# Patient Record
Sex: Female | Born: 1965 | Race: White | Hispanic: Yes | Smoking: Never smoker
Health system: Southern US, Community
[De-identification: ages and names within clinical notes are randomized; demographics above are authoritative.]

## PROBLEM LIST (undated history)

## (undated) DIAGNOSIS — E7800 Pure hypercholesterolemia, unspecified: Secondary | ICD-10-CM

## (undated) DIAGNOSIS — D649 Anemia, unspecified: Secondary | ICD-10-CM

## (undated) DIAGNOSIS — I10 Essential (primary) hypertension: Secondary | ICD-10-CM

## (undated) DIAGNOSIS — E119 Type 2 diabetes mellitus without complications: Secondary | ICD-10-CM

## (undated) DIAGNOSIS — K56609 Unspecified intestinal obstruction, unspecified as to partial versus complete obstruction: Secondary | ICD-10-CM

## (undated) HISTORY — PX: RIGHT OOPHORECTOMY: SHX2359

## (undated) HISTORY — PX: HERNIA REPAIR: SHX51

## (undated) HISTORY — PX: LEFT OOPHORECTOMY: SHX1961

---

## 2009-04-27 ENCOUNTER — Inpatient Hospital Stay (HOSPITAL_COMMUNITY): Admission: AD | Admit: 2009-04-27 | Discharge: 2009-04-29 | Payer: Self-pay | Admitting: General Surgery

## 2009-04-28 ENCOUNTER — Encounter (INDEPENDENT_AMBULATORY_CARE_PROVIDER_SITE_OTHER): Payer: Self-pay | Admitting: General Surgery

## 2009-09-19 ENCOUNTER — Ambulatory Visit (HOSPITAL_COMMUNITY): Admission: RE | Admit: 2009-09-19 | Discharge: 2009-09-19 | Payer: Self-pay | Admitting: General Surgery

## 2009-09-19 IMAGING — CT CT ABD-PELV W/ CM
2 of 5 series · 16 of 46 positions shown, 18 images · IV contrast (Omnipaque 300)
Comparison: None

CLINICAL DATA: Abdominal pain, left side and left lower abdomen
tenderness, past history of hernia repair, benign tumor removal,
diabetes, hypertension

CT ABDOMEN AND PELVIS WITH CONTRAST
TECHNIQUE: Multidetector CT imaging of the abdomen and pelvis was
performed following the standard protocol during bolus
administration of intravenous contrast. Breast shield utilized.
Sagittal and coronal MPR images reconstructed from axial data set.
Contrast: Dilute oral contrast and 100 ml [LM] IV

[Series 2: abd_pel_with 5.0 b40f · axial · 0.80mm/px · z∈[-470,-40]mm · 13 of 98 slices shown, 15 images]
[im 6/98  soft-tissue]
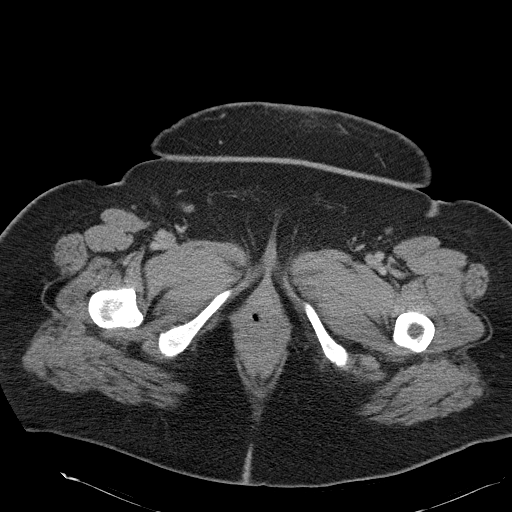
[im 6/98  bone]
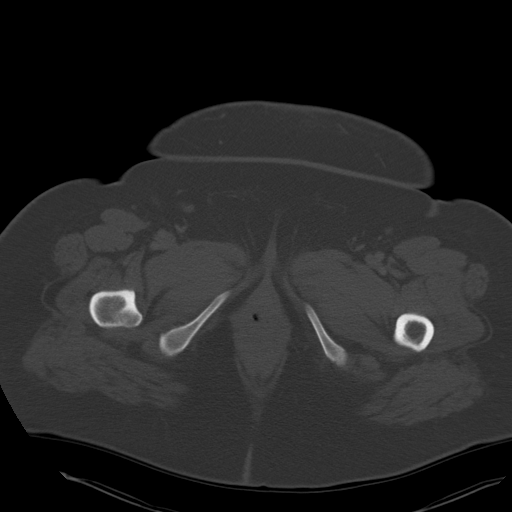
[im 12/98  soft-tissue]
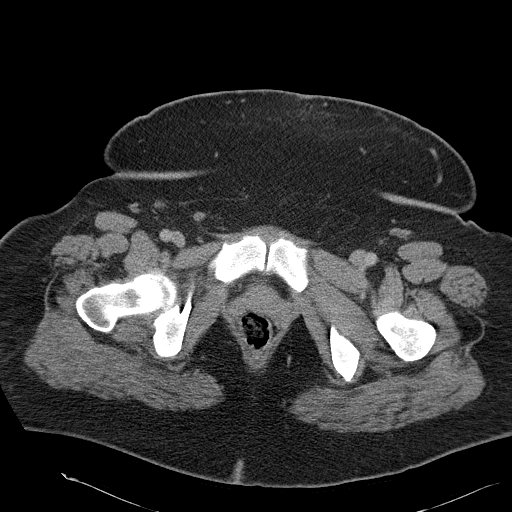
[im 23/98  soft-tissue]
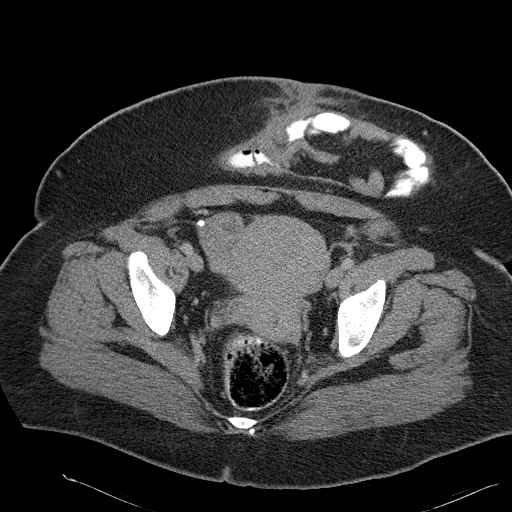
[im 29/98  soft-tissue]
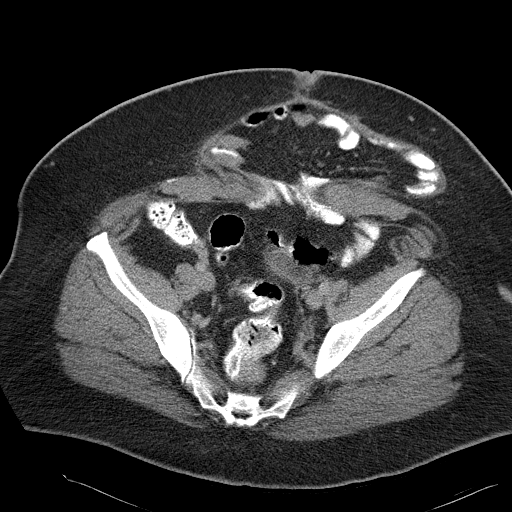
[im 35/98  soft-tissue]
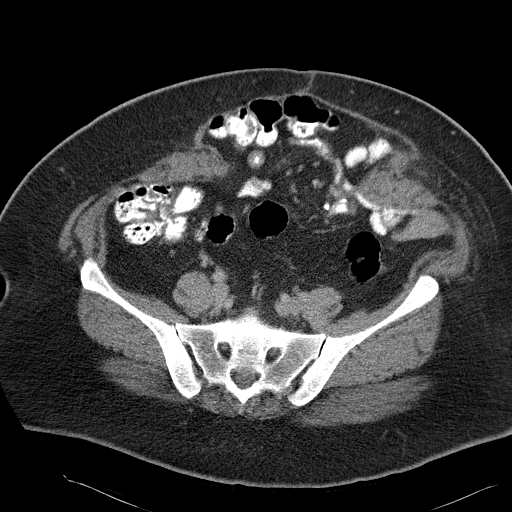
[im 40/98  soft-tissue]
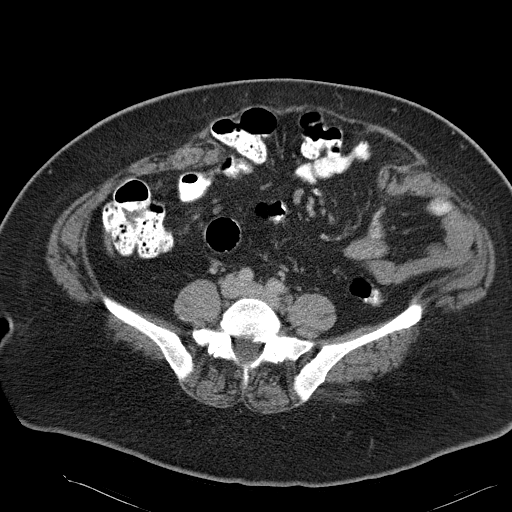
[im 52/98  soft-tissue]
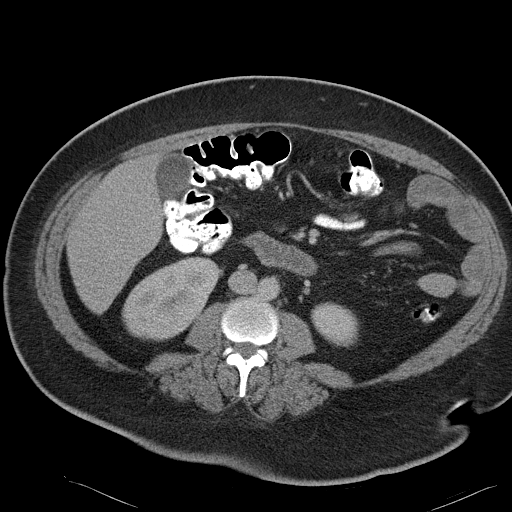
[im 58/98  soft-tissue]
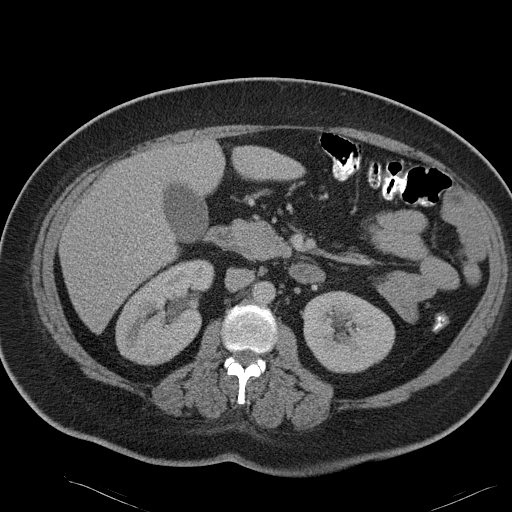
[im 63/98  soft-tissue]
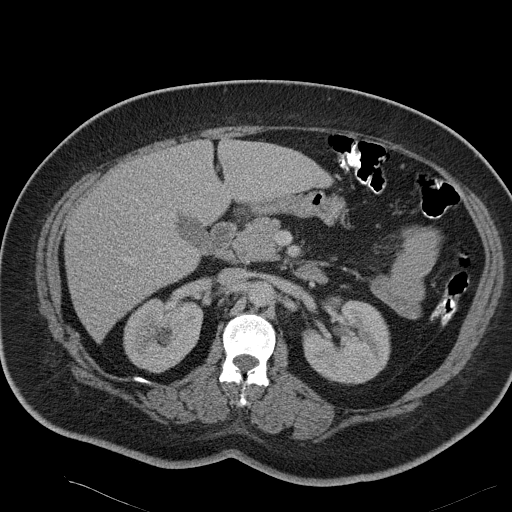
[im 63/98  bone]
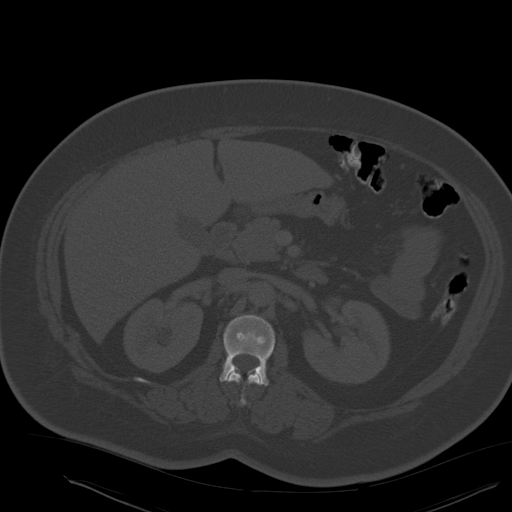
[im 69/98  soft-tissue]
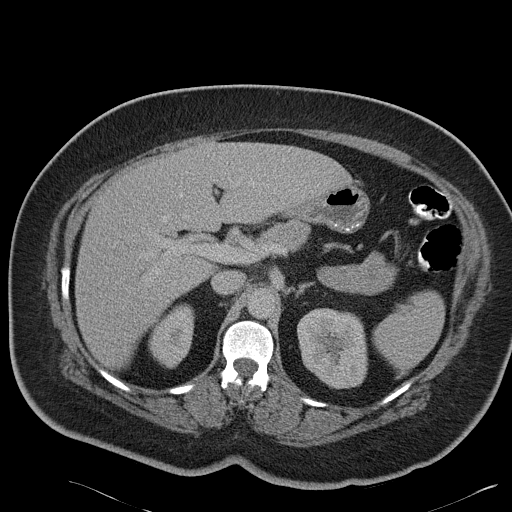
[im 75/98  soft-tissue]
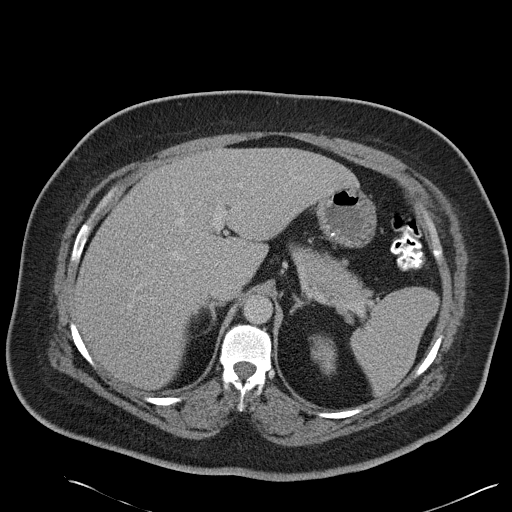
[im 86/98  soft-tissue]
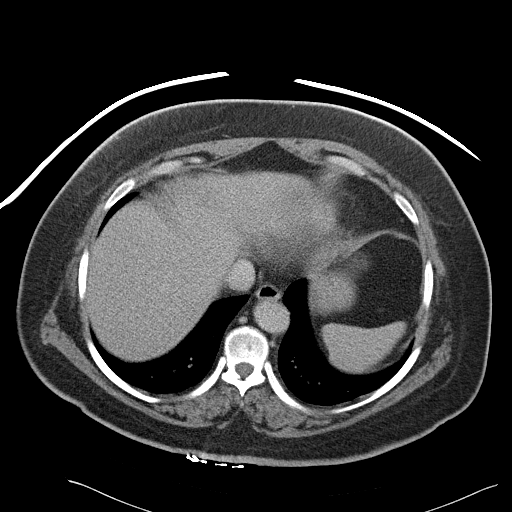
[im 92/98  soft-tissue]
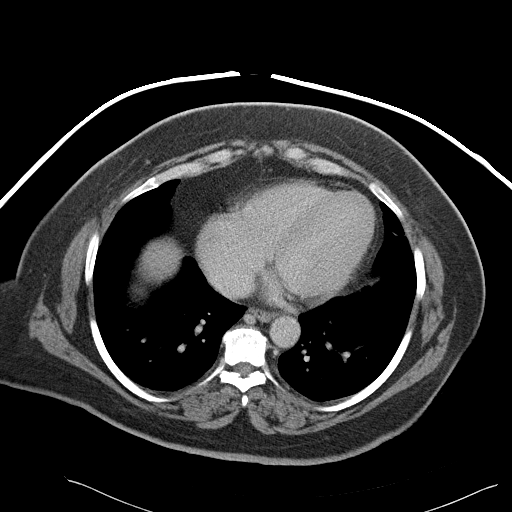

[Series 4: abd_pel_with 3.0 spo cor · coronal · 0.81mm/px · 3 of 98 slices shown]
[im 33/98  soft-tissue]
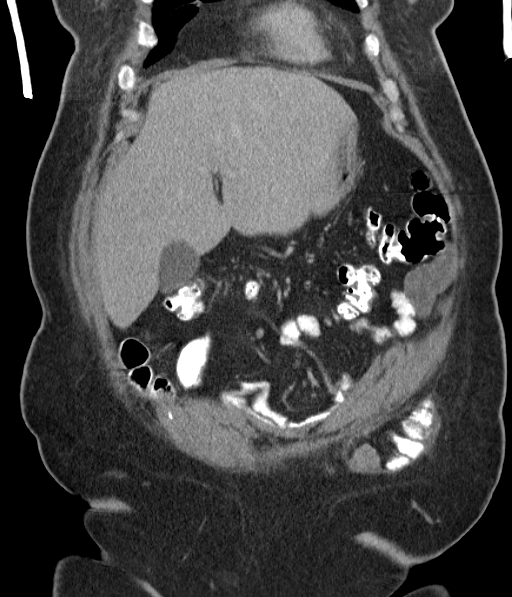
[im 44/98  soft-tissue]
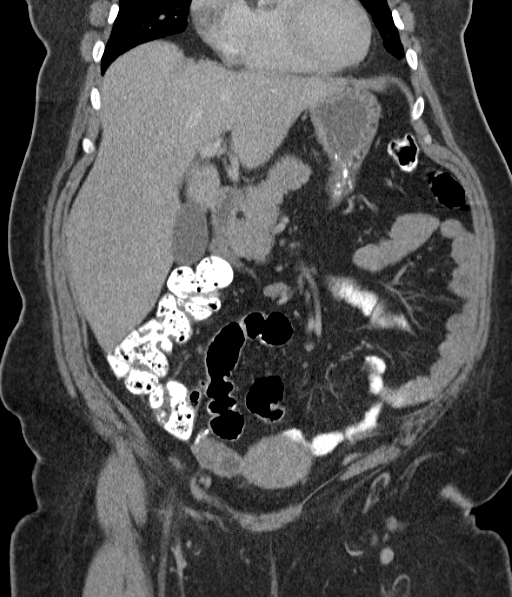
[im 54/98  soft-tissue]
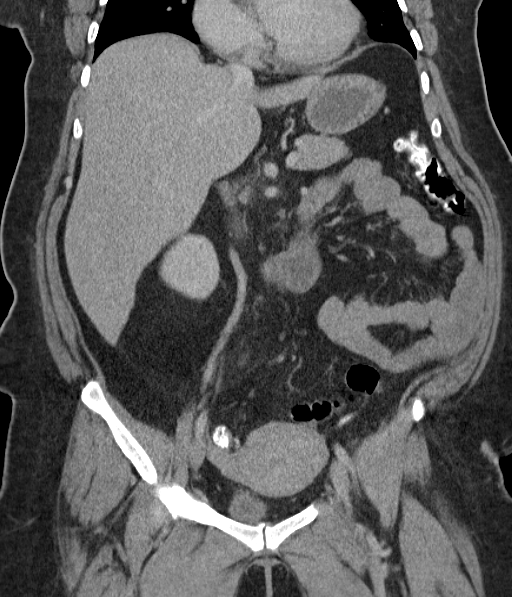

[16 of 46 positions shown; findings below may reference images not displayed]

FINDINGS: Minimal atelectasis at lung bases.
Tiny nonobstructing calculus lower pole left kidney image 42.
Liver, spleen, pancreas, kidneys, and adrenal glands otherwise
unremarkable.
Stomach normal appearance.
Tiny right ovarian cyst 2.1 x 2.0 cm image 77.
Decompressed bladder.
Uterus and left adnexa unremarkable.
Normal appendix.

Patient gives history of prior ventral hernia repair.
Numerous nonobstructed large and small bowel loops extend through a
defect in the anterior abdominal wall fascia into a large hernia
sac, extending from supraumbilical to mid pelvis, greater to left
of midline than right.
Fascial defect measures 12.5 cm in diameter and 10.1 cm length.
No bowel wall thickening identified.
Small amount of free fluid seen at inferior aspect of hernia sac.
No additional mass, ascites, or inflammatory process.
Bones unremarkable.
IMPRESSION: Tiny nonobstructing left renal calculus.
Tiny right ovarian cyst.
Large ventral hernia extending from above umbilicus to the mid
pelvis, sac containing nonobstructed large and small bowel loops as
well as a small amount of free fluid.

## 2010-05-04 LAB — CREATININE, SERUM: GFR calc non Af Amer: >60 mL/min (ref >60)

## 2010-05-14 LAB — COMPREHENSIVE METABOLIC PANEL
ALT: 22 U/L (ref 0–35)
CO2: 26 mEq/L (ref 19–32)
Creatinine, Ser: 0.52 mg/dL (ref 0.4–1.2)
GFR calc non Af Amer: >60 mL/min (ref >60)
Total Bilirubin: 0.4 mg/dL (ref 0.3–1.2)
Total Bilirubin: 0.7 mg/dL (ref 0.3–1.2)
Total Protein: 6.6 g/dL (ref 6.0–8.3)

## 2010-05-14 LAB — DIFFERENTIAL
Basophils Relative: 1 % (ref 0–1)
Basophils Relative: 1 % (ref 0–1)
Eosinophils Relative: 1 % (ref 0–5)
Lymphocytes Relative: 20 % (ref 12–46)
Lymphocytes Relative: 38 % (ref 12–46)
Lymphs Abs: 2 10*3/uL (ref 0.7–4.0)
Lymphs Abs: 2.1 10*3/uL (ref 0.7–4.0)
Monocytes Absolute: 0.5 10*3/uL (ref 0.1–1.0)
Monocytes Absolute: 0.5 10*3/uL (ref 0.1–1.0)
Monocytes Absolute: 0.5 10*3/uL (ref 0.1–1.0)
Monocytes Relative: 5 % (ref 3–12)
Monocytes Relative: 6 % (ref 3–12)
Monocytes Relative: 7 % (ref 3–12)
Neutro Abs: 3.8 10*3/uL (ref 1.7–7.7)
Neutrophils Relative %: 74 % (ref 43–77)

## 2010-05-14 LAB — GLUCOSE, CAPILLARY
Glucose-Capillary: 102 mg/dL — ABNORMAL HIGH (ref 70–99)
Glucose-Capillary: 104 mg/dL — ABNORMAL HIGH (ref 70–99)
Glucose-Capillary: 115 mg/dL — ABNORMAL HIGH (ref 70–99)
Glucose-Capillary: 118 mg/dL — ABNORMAL HIGH (ref 70–99)
Glucose-Capillary: 122 mg/dL — ABNORMAL HIGH (ref 70–99)
Glucose-Capillary: 127 mg/dL — ABNORMAL HIGH (ref 70–99)
Glucose-Capillary: 131 mg/dL — ABNORMAL HIGH (ref 70–99)

## 2010-05-14 LAB — CBC
HCT: 31.2 % — ABNORMAL LOW (ref 36.0–46.0)
HCT: 33.6 % — ABNORMAL LOW (ref 36.0–46.0)
Hemoglobin: 11 g/dL — ABNORMAL LOW (ref 12.0–15.0)
MCHC: 35.4 g/dL (ref 30.0–36.0)
MCV: 82.6 fL (ref 78.0–100.0)
MCV: 83.6 fL (ref 78.0–100.0)
Platelets: 266 10*3/uL (ref 150–400)
Platelets: 285 10*3/uL (ref 150–400)
RBC: 3.74 MIL/uL — ABNORMAL LOW (ref 3.87–5.11)
RBC: 3.79 MIL/uL — ABNORMAL LOW (ref 3.87–5.11)
RDW: 16.4 % — ABNORMAL HIGH (ref 11.5–15.5)
WBC: 7.3 10*3/uL (ref 4.0–10.5)
WBC: 8 10*3/uL (ref 4.0–10.5)
WBC: 9.9 10*3/uL (ref 4.0–10.5)

## 2010-05-14 LAB — CROSSMATCH
ABO/RH(D): O POS
Antibody Screen: NEGATIVE

## 2010-05-14 LAB — ABO/RH: ABO/RH(D): O POS

## 2010-05-14 LAB — BASIC METABOLIC PANEL
BUN: 8 mg/dL (ref 6–23)
Calcium: 8.3 mg/dL — ABNORMAL LOW (ref 8.4–10.5)
Chloride: 104 mEq/L (ref 96–112)
Potassium: 3.1 mEq/L — ABNORMAL LOW (ref 3.5–5.1)
Sodium: 138 mEq/L (ref 135–145)

## 2010-05-14 LAB — PHOSPHORUS: Phosphorus: 4.4 mg/dL (ref 2.3–4.6)

## 2012-08-24 ENCOUNTER — Inpatient Hospital Stay (HOSPITAL_COMMUNITY): Payer: Self-pay

## 2012-08-24 ENCOUNTER — Encounter (HOSPITAL_COMMUNITY): Payer: Self-pay | Admitting: *Deleted

## 2012-08-24 ENCOUNTER — Emergency Department (HOSPITAL_COMMUNITY): Payer: Self-pay

## 2012-08-24 ENCOUNTER — Inpatient Hospital Stay (HOSPITAL_COMMUNITY)
Admission: EM | Admit: 2012-08-24 | Discharge: 2012-08-27 | DRG: 394 | Disposition: A | Discharge status: Home / Self Care | Payer: MEDICAID | Attending: General Surgery | Admitting: General Surgery

## 2012-08-24 DIAGNOSIS — K56609 Unspecified intestinal obstruction, unspecified as to partial versus complete obstruction: Secondary | ICD-10-CM

## 2012-08-24 DIAGNOSIS — N390 Urinary tract infection, site not specified: Secondary | ICD-10-CM

## 2012-08-24 DIAGNOSIS — I152 Hypertension secondary to endocrine disorders: Secondary | ICD-10-CM | POA: Diagnosis present

## 2012-08-24 DIAGNOSIS — K439 Ventral hernia without obstruction or gangrene: Secondary | ICD-10-CM

## 2012-08-24 DIAGNOSIS — Z880 Allergy status to penicillin: Secondary | ICD-10-CM

## 2012-08-24 DIAGNOSIS — E119 Type 2 diabetes mellitus without complications: Secondary | ICD-10-CM | POA: Diagnosis present

## 2012-08-24 DIAGNOSIS — Z6841 Body Mass Index (BMI) 40.0 and over, adult: Secondary | ICD-10-CM

## 2012-08-24 DIAGNOSIS — I10 Essential (primary) hypertension: Secondary | ICD-10-CM | POA: Diagnosis present

## 2012-08-24 DIAGNOSIS — K436 Other and unspecified ventral hernia with obstruction, without gangrene: Principal | ICD-10-CM | POA: Diagnosis present

## 2012-08-24 DIAGNOSIS — E1159 Type 2 diabetes mellitus with other circulatory complications: Secondary | ICD-10-CM | POA: Diagnosis present

## 2012-08-24 HISTORY — DX: Unspecified intestinal obstruction, unspecified as to partial versus complete obstruction: K56.609

## 2012-08-24 HISTORY — DX: Pure hypercholesterolemia, unspecified: E78.00

## 2012-08-24 HISTORY — DX: Anemia, unspecified: D64.9

## 2012-08-24 HISTORY — DX: Type 2 diabetes mellitus without complications: E11.9

## 2012-08-24 HISTORY — DX: Essential (primary) hypertension: I10

## 2012-08-24 LAB — CBC WITH DIFFERENTIAL/PLATELET
Basophils Absolute: 0 10*3/uL (ref 0.0–0.1)
Basophils Relative: 0 % (ref 0–1)
Eosinophils Relative: 0 % (ref 0–5)
HCT: 39.4 % (ref 36.0–46.0)
MCHC: 34 g/dL (ref 30.0–36.0)
MCV: 74.6 fL — ABNORMAL LOW (ref 78.0–100.0)
Monocytes Absolute: 0.8 10*3/uL (ref 0.1–1.0)
RDW: 15.1 % (ref 11.5–15.5)

## 2012-08-24 LAB — CBC
HCT: 36.7 % (ref 36.0–46.0)
Hemoglobin: 12 g/dL (ref 12.0–15.0)
RBC: 4.88 MIL/uL (ref 3.87–5.11)
WBC: 15.8 10*3/uL — ABNORMAL HIGH (ref 4.0–10.5)

## 2012-08-24 LAB — POCT I-STAT, CHEM 8
Calcium, Ion: 1.13 mmol/L (ref 1.12–1.23)
Creatinine, Ser: 0.7 mg/dL (ref 0.50–1.10)
Hemoglobin: 15.3 g/dL — ABNORMAL HIGH (ref 12.0–15.0)
Sodium: 136 mEq/L (ref 135–145)
TCO2: 22 mmol/L (ref 0–100)

## 2012-08-24 LAB — URINALYSIS, ROUTINE W REFLEX MICROSCOPIC
Hgb urine dipstick: NEGATIVE
Protein, ur: >300 mg/dL — AB
Urobilinogen, UA: 0.2 mg/dL (ref 0.0–1.0)

## 2012-08-24 LAB — CREATININE, SERUM
Creatinine, Ser: 0.77 mg/dL (ref 0.50–1.10)
GFR calc Af Amer: >90 mL/min (ref >90)
GFR calc non Af Amer: >90 mL/min (ref >90)

## 2012-08-24 LAB — HEPATIC FUNCTION PANEL
Albumin: 4.1 g/dL (ref 3.5–5.2)
Alkaline Phosphatase: 111 U/L (ref 39–117)
Total Bilirubin: 0.3 mg/dL (ref 0.3–1.2)

## 2012-08-24 LAB — URINE MICROSCOPIC-ADD ON

## 2012-08-24 LAB — POCT PREGNANCY, URINE: Preg Test, Ur: NEGATIVE

## 2012-08-24 MED ORDER — SODIUM CHLORIDE 0.9 % IV SOLN
Freq: Once | INTRAVENOUS | Status: AC
  Administered 2012-08-24: 06:00:00 via INTRAVENOUS

## 2012-08-24 MED ORDER — SODIUM CHLORIDE 0.9 % IV BOLUS (SEPSIS)
1000.0000 mL | INTRAVENOUS | Status: AC
  Administered 2012-08-24: 1000 mL via INTRAVENOUS

## 2012-08-24 MED ORDER — PANTOPRAZOLE SODIUM 40 MG IV SOLR
40.0000 mg | Freq: Every day | INTRAVENOUS | Status: DC
  Administered 2012-08-24 – 2012-08-26 (×3): 40 mg via INTRAVENOUS
  Filled 2012-08-24 (×5): qty 40

## 2012-08-24 MED ORDER — METOPROLOL TARTRATE 1 MG/ML IV SOLN: 5.0000 mg | Freq: Four times a day (QID) | INTRAVENOUS | Status: DC | PRN

## 2012-08-24 MED ORDER — IOHEXOL 300 MG/ML  SOLN
50.0000 mL | Freq: Once | INTRAMUSCULAR | Status: AC | PRN
Start: 2012-08-24 — End: 2012-08-24
  Administered 2012-08-24: 50 mL via ORAL

## 2012-08-24 MED ORDER — LIDOCAINE HCL 2 % EX GEL
Freq: Once | CUTANEOUS | Status: AC
  Administered 2012-08-24: 20 via TOPICAL
  Filled 2012-08-24: qty 20

## 2012-08-24 MED ORDER — POTASSIUM CHLORIDE IN NACL 20-0.9 MEQ/L-% IV SOLN
INTRAVENOUS | Status: DC
  Administered 2012-08-24 – 2012-08-26 (×6): via INTRAVENOUS
  Filled 2012-08-24 (×9): qty 1000

## 2012-08-24 MED ORDER — HYDROMORPHONE HCL PF 1 MG/ML IJ SOLN
1.0000 mg | Freq: Once | INTRAMUSCULAR | Status: AC
  Administered 2012-08-24: 1 mg via INTRAVENOUS
  Filled 2012-08-24: qty 1

## 2012-08-24 MED ORDER — CIPROFLOXACIN IN D5W 400 MG/200ML IV SOLN
400.0000 mg | Freq: Two times a day (BID) | INTRAVENOUS | Status: DC
  Administered 2012-08-24 – 2012-08-26 (×5): 400 mg via INTRAVENOUS
  Filled 2012-08-24 (×9): qty 200

## 2012-08-24 MED ORDER — CIPROFLOXACIN IN D5W 400 MG/200ML IV SOLN
400.0000 mg | Freq: Once | INTRAVENOUS | Status: AC
  Administered 2012-08-24: 400 mg via INTRAVENOUS
  Filled 2012-08-24: qty 200

## 2012-08-24 MED ORDER — ACETAMINOPHEN 325 MG PO TABS: 650.0000 mg | ORAL_TABLET | Freq: Four times a day (QID) | ORAL | Status: DC | PRN

## 2012-08-24 MED ORDER — ONDANSETRON HCL 4 MG/2ML IJ SOLN
4.0000 mg | Freq: Once | INTRAMUSCULAR | Status: AC
  Administered 2012-08-24: 4 mg via INTRAVENOUS
  Filled 2012-08-24: qty 2

## 2012-08-24 MED ORDER — OXYMETAZOLINE HCL 0.05 % NA SOLN
1.0000 | Freq: Once | NASAL | Status: AC
  Administered 2012-08-24: 1 via NASAL
  Filled 2012-08-24: qty 15

## 2012-08-24 MED ORDER — ONDANSETRON HCL 4 MG/2ML IJ SOLN
4.0000 mg | Freq: Four times a day (QID) | INTRAMUSCULAR | Status: DC | PRN
  Administered 2012-08-24: 4 mg via INTRAVENOUS
  Filled 2012-08-24: qty 2

## 2012-08-24 MED ORDER — CHLORHEXIDINE GLUCONATE 0.12 % MT SOLN
15.0000 mL | Freq: Two times a day (BID) | OROMUCOSAL | Status: DC
  Administered 2012-08-24 – 2012-08-26 (×3): 15 mL via OROMUCOSAL
  Filled 2012-08-24 (×2): qty 15

## 2012-08-24 MED ORDER — IOHEXOL 300 MG/ML  SOLN
100.0000 mL | Freq: Once | INTRAMUSCULAR | Status: AC | PRN
  Administered 2012-08-24: 100 mL via INTRAVENOUS

## 2012-08-24 MED ORDER — ACETAMINOPHEN 650 MG RE SUPP: 650.0000 mg | Freq: Four times a day (QID) | RECTAL | Status: DC | PRN

## 2012-08-24 MED ORDER — HYDROMORPHONE HCL PF 1 MG/ML IJ SOLN
1.0000 mg | INTRAMUSCULAR | Status: DC | PRN
  Administered 2012-08-24 (×2): 1 mg via INTRAVENOUS
  Filled 2012-08-24 (×2): qty 1

## 2012-08-24 MED ORDER — BIOTENE DRY MOUTH MT LIQD
15.0000 mL | Freq: Two times a day (BID) | OROMUCOSAL | Status: DC
  Administered 2012-08-24 – 2012-08-27 (×6): 15 mL via OROMUCOSAL

## 2012-08-24 MED ORDER — ENOXAPARIN SODIUM 40 MG/0.4ML ~~LOC~~ SOLN
40.0000 mg | SUBCUTANEOUS | Status: DC
  Administered 2012-08-24 – 2012-08-27 (×4): 40 mg via SUBCUTANEOUS
  Filled 2012-08-24 (×4): qty 0.4

## 2012-08-24 NOTE — ED Notes (Signed)
Patient with abdominal pain since yesterday at 0500, after eating a steak last night around this time.  Patient abdomen is large, distended, tight and painful to palpation in right upper and lower quadrants, left lower quadrant.

## 2012-08-24 NOTE — H&P (Signed)
Gabriela Watkins is an 47 y.o. female.   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  Two subsequent operations have been done for hernias, the last one was in 2011 by Dr. Leticia Penna.  She comes in now for SBO sign and symptoms.  Past Medical History  Diagnosis Date  . Diabetes mellitus without complication   . Hypertension     History reviewed. No pertinent past surgical history.  No family history on file. Social History:  reports that she has never smoked. She does not have any smokeless tobacco history on file. She reports that she does not drink alcohol. Her drug history is not on file.  Allergies:  Allergies  Allergen Reactions  . Penicillins Swelling     (Not in a hospital admission)  Results for orders placed during the hospital encounter of 08/24/12 (from the past 48 hour(s))  CBC WITH DIFFERENTIAL     Status: Abnormal   Collection Time    08/24/12  2:20 AM      Result Value Range   WBC 18.9 (*) 4.0 - 10.5 K/uL   RBC 5.28 (*) 3.87 - 5.11 MIL/uL   Hemoglobin 13.4  12.0 - 15.0 g/dL   HCT 16.1  09.6 - 04.5 %   MCV 74.6 (*) 78.0 - 100.0 fL   MCH 25.4 (*) 26.0 - 34.0 pg   MCHC 34.0  30.0 - 36.0 g/dL   RDW 40.9  81.1 - 91.4 %   Platelets 447 (*) 150 - 400 K/uL   Neutrophils Relative % 85 (*) 43 - 77 %   Neutro Abs 16.1 (*) 1.7 - 7.7 K/uL   Lymphocytes Relative 10 (*) 12 - 46 %   Lymphs Abs 1.9  0.7 - 4.0 K/uL   Monocytes Relative 4  3 - 12 %   Monocytes Absolute 0.8  0.1 - 1.0 K/uL   Eosinophils Relative 0  0 - 5 %   Eosinophils Absolute 0.0  0.0 - 0.7 K/uL   Basophils Relative 0  0 - 1 %   Basophils Absolute 0.0  0.0 - 0.1 K/uL  LIPASE, BLOOD     Status: None   Collection Time    08/24/12  2:20 AM      Result Value Range   Lipase 36  11 - 59 U/L  HEPATIC FUNCTION PANEL     Status: Abnormal   Collection Time    08/24/12  2:20 AM      Result Value Range   Total Protein 8.4 (*) 6.0 - 8.3 g/dL   Albumin 4.1  3.5 - 5.2 g/dL   AST 15  0 - 37 U/L   ALT 16  0 - 35 U/L   Alkaline Phosphatase 111  39 - 117 U/L   Total Bilirubin 0.3  0.3 - 1.2 mg/dL   Bilirubin, Direct <7.8  0.0 - 0.3 mg/dL   Indirect Bilirubin NOT CALCULATED  0.3 - 0.9 mg/dL  POCT I-STAT, CHEM 8     Status: Abnormal   Collection Time    08/24/12  2:30 AM      Result Value Range   Sodium 136  135 - 145 mEq/L   Potassium 4.1  3.5 - 5.1 mEq/L   Chloride 100  96 - 112 mEq/L   BUN 25 (*) 6 - 23 mg/dL  Creatinine, Ser 0.70  0.50 - 1.10 mg/dL   Glucose, Bld 119 (*) 70 - 99 mg/dL   Calcium, Ion 1.47  8.29 - 1.23 mmol/L   TCO2 22  0 - 100 mmol/L   Hemoglobin 15.3 (*) 12.0 - 15.0 g/dL   HCT 56.2  13.0 - 86.5 %  GLUCOSE, CAPILLARY     Status: Abnormal   Collection Time    08/24/12  2:35 AM      Result Value Range   Glucose-Capillary 173 (*) 70 - 99 mg/dL  URINALYSIS, ROUTINE W REFLEX MICROSCOPIC     Status: Abnormal   Collection Time    08/24/12  2:50 AM      Result Value Range   Color, Urine AMBER (*) YELLOW   Comment: BIOCHEMICALS MAY BE AFFECTED BY COLOR   APPearance TURBID (*) CLEAR   Specific Gravity, Urine 1.034 (*) 1.005 - 1.030   pH 5.5  5.0 - 8.0   Glucose, UA 100 (*) NEGATIVE mg/dL   Hgb urine dipstick NEGATIVE  NEGATIVE   Bilirubin Urine SMALL (*) NEGATIVE   Ketones, ur 15 (*) NEGATIVE mg/dL   Protein, ur >784 (*) NEGATIVE mg/dL   Urobilinogen, UA 0.2  0.0 - 1.0 mg/dL   Nitrite NEGATIVE  NEGATIVE   Leukocytes, UA SMALL (*) NEGATIVE  URINE MICROSCOPIC-ADD ON     Status: Abnormal   Collection Time    08/24/12  2:50 AM      Result Value Range   Squamous Epithelial / LPF MANY (*) RARE   WBC, UA 11-20  <3 WBC/hpf   RBC / HPF 0-2  <3 RBC/hpf   Bacteria, UA MANY (*) RARE   Casts HYALINE CASTS (*) NEGATIVE  POCT PREGNANCY, URINE     Status: None   Collection Time    08/24/12  2:54 AM      Result  Value Range   Preg Test, Ur NEGATIVE  NEGATIVE   Comment:            THE SENSITIVITY OF THIS     METHODOLOGY IS >24 mIU/mL   Ct Abdomen Pelvis W Contrast  08/24/2012   *RADIOLOGY REPORT*  Clinical Data: Abdominal pain and distension after hernia repair.  CT ABDOMEN AND PELVIS WITH CONTRAST  Technique:  Multidetector CT imaging of the abdomen and pelvis was performed following the standard protocol during bolus administration of intravenous contrast.  Contrast: OMNIPAQUE IOHEXOL 300 MG/ML  SOLN  Comparison: 09/19/2009  Findings: Motion artifact in the lung bases.  No discrete consolidation.  There is a large midline ventral abdominal wall hernia.  This displaces the rectus abdominous muscles with hernia opening measuring up to 1.4 cm.  The hernia contains fat, colon, and small bowel.  There is gastric distension with an air-fluid level.  There is proximal small bowel distension.  There is decompression of the distal small bowel with transition zone noted inside the hernia. Changes are consistent with small bowel obstruction.  The colon is decompressed but stool filled.  The liver, spleen, gallbladder, pancreas, adrenal glands, kidneys, abdominal aorta, inferior vena cava, and retroperitoneal lymph nodes are unremarkable.  No free fluid or free air in the abdomen. Portal and mesenteric vessels are patent.  Pelvis:  The uterus and ovaries are not enlarged.  Probable involuting cyst in the right ovary.  Surgical clips in the right lower quadrant suggest appendectomy.  No evidence of diverticulitis.  No free or loculated pelvic fluid collections.  No significant pelvic lymphadenopathy.  Normal  alignment of the lumbar vertebrae.  IMPRESSION: Large ventral abdominal wall hernia at the midline containing small bowel, colon, and fat.  Small bowel obstruction with transition zone within the hernia.   Original Report Authenticated By: Burman Nieves, M.D.    Review of Systems  Constitutional: Negative for  fever and chills.  Gastrointestinal: Positive for nausea, vomiting and abdominal pain.    Blood pressure 121/75, pulse 98, temperature 97.8 F (36.6 C), temperature source Oral, resp. rate 18, last menstrual period 07/31/2012, SpO2 100.00%. Physical Exam  Constitutional: She is oriented to person, place, and time.  Morbidly obese  HENT:  Head: Normocephalic and atraumatic.  Eyes: Pupils are equal, round, and reactive to light.  Neck: Normal range of motion. Neck supple.  Cardiovascular: Normal rate, regular rhythm and normal heart sounds.   Respiratory: Effort normal and breath sounds normal.  GI: She exhibits distension. She exhibits no mass. Bowel sounds are absent. There is tenderness (mild) in the epigastric area.  Musculoskeletal: Normal range of motion.  Neurological: She is alert and oriented to person, place, and time. She has normal reflexes.  Skin: Skin is warm and dry.  Psychiatric: She has a normal mood and affect. Her behavior is normal. Judgment and thought content normal.     Assessment/Plan Large, likely non-repairable, ventral hernia with SBO  Admit for NGT decompression, IV hydration, and pain control. Patient is getting antibiotics for suspected UTI by UA.  Cherylynn Ridges 08/24/2012, 6:48 AM

## 2012-08-24 NOTE — ED Notes (Signed)
I/S given/explained to pt./family in ED, stressed deep breath/cough.

## 2012-08-24 NOTE — ED Notes (Signed)
The pt has had abd pain since 0500am today with nv.

## 2012-08-24 NOTE — ED Provider Notes (Signed)
History    CSN: [id number] Arrival date & time 08/24/12  0150  First MD Initiated Contact with Patient 08/24/12 [phone]     Chief Complaint  Patient presents with  . 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 She states that she has had hernia repair, she then had a dehisced since according to her description and a repeat surgery. She wears an abdominal binder to help with the hernia. Approximately 20 hours ago she developed acute onset of abdominal pain and has had persistent nausea and vomiting with abdominal pain since that time. She states that the pain waxes and wanes in intensity, is gradually worsening and is not associated with dysuria, diarrhea or rectal bleeding. She has had nothing to eat by mouth in over 24 hours because of nausea and vomiting.  Patient is a 47 y.o. female presenting with abdominal pain. The history is provided by the patient and a relative.  Abdominal Pain Associated symptoms include abdominal pain.   Past Medical History  Diagnosis Date  . Diabetes mellitus without complication   . Hypertension    History reviewed. No pertinent past surgical history. No family history on file. History  Substance Use Topics  . Smoking status: Never Smoker   . Smokeless tobacco: Not on file  . Alcohol Use: No   OB History   Grav Para Term Preterm Abortions TAB SAB Ect Mult Living                 Review of Systems  Gastrointestinal: Positive for abdominal pain.  All other systems reviewed and are negative.    Allergies  Penicillins  Home Medications   Current Outpatient Rx  Name  Route  Sig  Dispense  Refill  . lisinopril-hydrochlorothiazide (PRINZIDE,ZESTORETIC) 10-12.5 MG per tablet   Oral   Take  1 tablet by mouth daily.         Marland Kitchen lovastatin (MEVACOR) 20 MG tablet   Oral   Take 20 mg by mouth at bedtime.         . metFORMIN (GLUCOPHAGE-XR) 500 MG 24 hr tablet   Oral   Take 500 mg by mouth daily with breakfast.          BP 139/77  Pulse 100  Temp(Src) 98.3 F (36.8 C) (Oral)  Resp 18  SpO2 100%  LMP 07/31/2012 Physical Exam  Nursing note and vitals reviewed. Constitutional: She appears well-developed and well-nourished. No distress.  HENT:  Head: Normocephalic and atraumatic.  Mouth/Throat: No oropharyngeal exudate.  Mucous membranes are dehydrated  Eyes: Conjunctivae and EOM are normal. Pupils are equal, round, and reactive to light. Right eye exhibits no discharge. Left eye exhibits no discharge. No scleral icterus.  Neck: Normal range of motion. Neck supple. No JVD present. No thyromegaly present.  Cardiovascular: Normal rate, regular rhythm, normal heart sounds and intact distal pulses.  Exam reveals no gallop and no friction rub.   No murmur heard. Pulmonary/Chest: Effort normal and breath sounds normal. No respiratory distress. She has no wheezes. She has no rales.  Abdominal: Soft. Bowel sounds are normal. She exhibits distension. She exhibits no mass. There is tenderness.  The abdomen is obese, soft, distended and tympanitic to percussion in the mid abdomen. She has no signs of incarcerated small hernias, no  signs of inguinal hernias, no peritoneal signs. Her tenderness is in the midabdomen  Musculoskeletal: Normal range of motion. She exhibits no edema and no tenderness.  Lymphadenopathy:    She has no cervical adenopathy.  Neurological: She is alert. Coordination normal.  Skin: Skin is warm and dry. No rash noted. No erythema.  Psychiatric: She has a normal mood and affect. Her behavior is normal.    ED Course  Procedures (including critical care time) Labs Reviewed  CBC WITH DIFFERENTIAL - Abnormal; Notable for the following:    WBC 18.9 (*)    RBC  5.28 (*)    MCV 74.6 (*)    MCH 25.4 (*)    Platelets 447 (*)    Neutrophils Relative % 85 (*)    Neutro Abs 16.1 (*)    Lymphocytes Relative 10 (*)    All other components within normal limits  URINALYSIS, ROUTINE W REFLEX MICROSCOPIC - Abnormal; Notable for the following:    Color, Urine AMBER (*)    APPearance TURBID (*)    Specific Gravity, Urine 1.034 (*)    Glucose, UA 100 (*)    Bilirubin Urine SMALL (*)    Ketones, ur 15 (*)    Protein, ur >300 (*)    Leukocytes, UA SMALL (*)    All other components within normal limits  HEPATIC FUNCTION PANEL - Abnormal; Notable for the following:    Total Protein 8.4 (*)    All other components within normal limits  GLUCOSE, CAPILLARY - Abnormal; Notable for the following:    Glucose-Capillary 173 (*)    All other components within normal limits  URINE MICROSCOPIC-ADD ON - Abnormal; Notable for the following:    Squamous Epithelial / LPF MANY (*)    Bacteria, UA MANY (*)    Casts HYALINE CASTS (*)    All other components within normal limits  POCT I-STAT, CHEM 8 - Abnormal; Notable for the following:    BUN 25 (*)    Glucose, Bld 187 (*)    Hemoglobin 15.3 (*)    All other components within normal limits  URINE CULTURE  LIPASE, BLOOD  POCT PREGNANCY, URINE   Ct Abdomen Pelvis W Contrast  08/24/2012   *RADIOLOGY REPORT*  Clinical Data: Abdominal pain and distension after hernia repair.  CT ABDOMEN AND PELVIS WITH CONTRAST  Technique:  Multidetector CT imaging of the abdomen and pelvis was performed following the standard protocol during bolus administration of intravenous contrast.  Contrast: OMNIPAQUE IOHEXOL 300 MG/ML  SOLN  Comparison: 09/19/2009  Findings: Motion artifact in the lung bases.  No discrete consolidation.  There is a large midline ventral abdominal wall hernia.  This displaces the rectus abdominous muscles with hernia opening measuring up to 1.4 cm.  The hernia contains fat, colon, and small bowel.  There is  gastric distension with an air-fluid level.  There is proximal small bowel distension.  There is decompression of the distal small bowel with transition zone noted inside the hernia. Changes are consistent with small bowel obstruction.  The colon is decompressed but stool filled.  The liver, spleen, gallbladder, pancreas, adrenal glands, kidneys, abdominal aorta, inferior vena cava, and retroperitoneal lymph nodes are unremarkable.  No free fluid or free air in the abdomen. Portal and mesenteric vessels are patent.  Pelvis:  The uterus and ovaries are not enlarged.  Probable involuting cyst in the right ovary.  Surgical clips in the right lower quadrant suggest appendectomy.  No evidence of diverticulitis.  No free or loculated pelvic fluid collections.  No significant pelvic lymphadenopathy.  Normal alignment of the lumbar vertebrae.  IMPRESSION: Large ventral abdominal wall hernia at the midline containing small bowel, colon, and fat.  Small bowel obstruction with transition zone within the hernia.   Original Report Authenticated By: Burman Nieves, M.D.   1. SBO (small bowel obstruction)   2. Hernia of abdominal wall   3. UTI (lower urinary tract infection)     MDM  The patient has a mild tachycardia and abdominal tenderness that could be consistent with a bowel obstruction, would also consider complications of her hernia, pancreatitis, cholecystitis. She has had urinary frequency today, as a diabetic she may be hypoglycemic and she has not had very good fluid intake at all.  CT scan shows that she has a SBO in the ventral wall hernia - she has a large leukocytosis and possible UTI - UA shows dehydration and proteinuria - renal function is preserved.  IVF, abx, and NG tube  D/w Dr. Lindie Spruce who have surgical team come to see pt.  Vida Roller, MD 08/24/12 [phone]

## 2012-08-24 NOTE — ED Notes (Signed)
Patient to CT scan

## 2012-08-24 NOTE — ED Notes (Signed)
Dr Wyatt at bedside.  

## 2012-08-25 ENCOUNTER — Encounter (HOSPITAL_COMMUNITY): Payer: Self-pay | Admitting: General Practice

## 2012-08-25 ENCOUNTER — Inpatient Hospital Stay (HOSPITAL_COMMUNITY): Payer: Self-pay

## 2012-08-25 LAB — BASIC METABOLIC PANEL
Chloride: 103 mEq/L (ref 96–112)
GFR calc Af Amer: >90 mL/min (ref >90)
GFR calc non Af Amer: >90 mL/min (ref >90)
Potassium: 4.2 mEq/L (ref 3.5–5.1)
Sodium: 135 mEq/L (ref 135–145)

## 2012-08-25 LAB — URINE CULTURE

## 2012-08-25 LAB — CBC
HCT: 32.5 % — ABNORMAL LOW (ref 36.0–46.0)
Hemoglobin: 10.6 g/dL — ABNORMAL LOW (ref 12.0–15.0)
MCHC: 32.6 g/dL (ref 30.0–36.0)
RDW: 15.4 % (ref 11.5–15.5)
WBC: 11.9 10*3/uL — ABNORMAL HIGH (ref 4.0–10.5)

## 2012-08-25 MED ORDER — PNEUMOCOCCAL VAC POLYVALENT 25 MCG/0.5ML IJ INJ
0.5000 mL | INJECTION | INTRAMUSCULAR | Status: AC
  Administered 2012-08-26: 0.5 mL via INTRAMUSCULAR
  Filled 2012-08-25: qty 0.5

## 2012-08-25 NOTE — Progress Notes (Signed)
I have seen and examined the pt and agree with PA-Riebock's progress note. Pt with about 5 BMs today.  No more abd pain or nausea.  1000cc overnight per NGT. abd soft, less dist, active BS  Con't NGT for another day, clamped. Ice chips Ok If con't to improve may likely have NGT out in AM and PO

## 2012-08-25 NOTE — Progress Notes (Signed)
Subjective: Pt had 2 loose bowel movements today.  NGT at low intermittent suction.  States her abdominal pain has resolved.  She is ambulating in the room.  Offered translator via telephone but the patient and son declined.  Objective: Vital signs in last 24 hours: Temp:  [97.4 F (36.3 C)-98.8 F (37.1 C)] 97.4 F (36.3 C) (07/08 0545) Pulse Rate:  [89-104] 97 (07/08 0545) Resp:  [18-19] 18 (07/08 0545) BP: (115-134)/(61-71) 115/61 mmHg (07/08 0545) SpO2:  [95 %-96 %] 95 % (07/08 0545) Weight:  [266 lb (120.657 kg)] 266 lb (120.657 kg) (07/07 2205) Last BM Date: 08/23/12  Intake/Output from previous day: 07/07 0701 - 07/08 0700 In: 1358 [I.V.:1358] Out: 1750 [Emesis/NG output:1000] Intake/Output this shift:   PE General appearance: alert, cooperative, appears stated age, no distress and moderate distress Resp: clear to auscultation bilaterally Cardio: regular rate and rhythm, S1, S2 normal, no murmur, click, rub or gallop GI: +bs X4 quadrants, high pitched.  TTP LLQ with a large reducible hernia.  NGT with large amount of bilious output, low intermittent suction Extremities: extremities normal, atraumatic, no cyanosis or edema.  +2 distal pulses  Lab Results:   Recent Labs  08/24/12 0643 08/25/12 0505  WBC 15.8* 11.9*  HGB 12.0 10.6*  HCT 36.7 32.5*  PLT 384 329   BMET  Recent Labs  08/24/12 0230 08/24/12 0643 08/25/12 0505  NA 136  --  135  K 4.1  --  4.2  CL 100  --  103  CO2  --   --  24  GLUCOSE 187*  --  131*  BUN 25*  --  21  CREATININE 0.70 0.77 0.52  CALCIUM  --   --  7.9*   Studies/Results: Ct Abdomen Pelvis W Contrast  08/24/2012   *RADIOLOGY REPORT*  Clinical Data: Abdominal pain and distension after hernia repair.  CT ABDOMEN AND PELVIS WITH CONTRAST  Technique:  Multidetector CT imaging of the abdomen and pelvis was performed following the standard protocol during bolus administration of intravenous contrast.  Contrast: OMNIPAQUE  IOHEXOL 300 MG/ML  SOLN  Comparison: 09/19/2009  Findings: Motion artifact in the lung bases.  No discrete consolidation.  There is a large midline ventral abdominal wall hernia.  This displaces the rectus abdominous muscles with hernia opening measuring up to 1.4 cm.  The hernia contains fat, colon, and small bowel.  There is gastric distension with an air-fluid level.  There is proximal small bowel distension.  There is decompression of the distal small bowel with transition zone noted inside the hernia. Changes are consistent with small bowel obstruction.  The colon is decompressed but stool filled.  The liver, spleen, gallbladder, pancreas, adrenal glands, kidneys, abdominal aorta, inferior vena cava, and retroperitoneal lymph nodes are unremarkable.  No free fluid or free air in the abdomen. Portal and mesenteric vessels are patent.  Pelvis:  The uterus and ovaries are not enlarged.  Probable involuting cyst in the right ovary.  Surgical clips in the right lower quadrant suggest appendectomy.  No evidence of diverticulitis.  No free or loculated pelvic fluid collections.  No significant pelvic lymphadenopathy.  Normal alignment of the lumbar vertebrae.  IMPRESSION: Large ventral abdominal wall hernia at the midline containing small bowel, colon, and fat.  Small bowel obstruction with transition zone within the hernia.   Original Report Authenticated By: Burman Nieves, M.D.   Dg Abd 2 Views  08/25/2012   *RADIOLOGY REPORT*  Clinical Data: Small bowel obstruction follow-up, NG  tube.  ABDOMEN - 2 VIEW  Comparison: 08/24/2012  Findings: NG tube tip projects over the mid stomach.  There is persistent dilatation of small bowel loops, measuring up to 6.2 cm. No free intraperitoneal air.  Organ outlines normal where seen.  No acute osseous finding.  IMPRESSION: NG tube tip projects over the stomach.  Small bowel obstruction pattern persists with dilated small bowel loops up to 6.2 cm.   Original Report  Authenticated By: Jearld Lesch, M.D.   Dg Abd 2 Views  08/24/2012   *RADIOLOGY REPORT*  Clinical Data: Follow up small bowel obstruction  ABDOMEN - 2 VIEW  Comparison: 08/24/2012  Findings: The enteric tube tip is in the stomach.  Again noted are centralized dilated small bowel loops within the lower abdomen and pelvis.  These measure up to the 4.2 cm. No free air identified.  IMPRESSION:  1.  Enteric tube tip is in the stomach. 2.  Dilated small bowel loops within the central lower abdomen compatible with small bowel obstruction.   Original Report Authenticated By: Signa Kell, M.D.    Anti-infectives: Anti-infectives   Start     Dose/Rate Route Frequency Ordered Stop   08/24/12 0700  ciprofloxacin (CIPRO) IVPB 400 mg     400 mg 200 mL/hr over 60 Minutes Intravenous Every 12 hours 08/24/12 0647     08/24/12 0545  ciprofloxacin (CIPRO) IVPB 400 mg     400 mg 200 mL/hr over 60 Minutes Intravenous  Once 08/24/12 0534 08/24/12 0724      Assessment/Plan: Small bowel obstruction secondary to ventral hernia -likely non operable, further surgical intervention according to Dr. Dwain Sarna -1035ml/24 of bilious output, continue with NGT to low intermittent suction.  She seems to be improving and has had 2 bms today.  She may have ice chips -improved WBCs, normal electrolytes. -continue with IVF -pain control -SCDs, lovenox and Ambulate  Diabetes Mellitus -BG 150s -hold metformin  Hypertension -home meds on hold, BP stable   LOS: 1 day   Rowyn Spilde, [facility]  ANP-BC Pager [phone]   08/25/2012 9:30 AM

## 2012-08-26 DIAGNOSIS — I10 Essential (primary) hypertension: Secondary | ICD-10-CM

## 2012-08-26 LAB — GLUCOSE, CAPILLARY: Glucose-Capillary: 100 mg/dL — ABNORMAL HIGH (ref 70–99)

## 2012-08-26 MED ORDER — LISINOPRIL 10 MG PO TABS
10.0000 mg | ORAL_TABLET | Freq: Every day | ORAL | Status: DC
  Administered 2012-08-26 – 2012-08-27 (×2): 10 mg via ORAL
  Filled 2012-08-26 (×2): qty 1

## 2012-08-26 MED ORDER — HYDROCODONE-ACETAMINOPHEN 5-325 MG PO TABS: 1.0000 | ORAL_TABLET | ORAL | Status: DC | PRN

## 2012-08-26 MED ORDER — HYDROCHLOROTHIAZIDE 12.5 MG PO CAPS
12.5000 mg | ORAL_CAPSULE | Freq: Every day | ORAL | Status: DC
  Administered 2012-08-26 – 2012-08-27 (×2): 12.5 mg via ORAL
  Filled 2012-08-26 (×2): qty 1

## 2012-08-26 MED ORDER — LISINOPRIL-HYDROCHLOROTHIAZIDE 10-12.5 MG PO TABS: 1.0000 | ORAL_TABLET | Freq: Every day | ORAL | Status: DC

## 2012-08-26 NOTE — Progress Notes (Signed)
Subjective: Pt feeling better, had 5bms yesterday, none today but having flatus.  Ambulating in the hallways.  NGT clamped yesterday, denies n/v or abdominal pain.   Objective: Vital signs in last 24 hours: Temp:  [98.1 F (36.7 C)-98.6 F (37 C)] 98.2 F (36.8 C) (07/09 0956) Pulse Rate:  [71-80] 71 (07/09 0956) Resp:  [16-20] 16 (07/09 0956) BP: (109-142)/(44-67) 117/52 mmHg (07/09 0956) SpO2:  [96 %-99 %] 99 % (07/09 0956) Last BM Date: August 31, 2012  Intake/Output from previous day: 09/01/22 0701 - 07/09 0700 In: 910 [I.V.:910] Out: 101 [Emesis/NG output:100; Stool:1] Intake/Output this shift:   PE  General appearance: alert, cooperative, appears stated age, no distress and moderate distress  Resp: clear to auscultation bilaterally  Cardio: regular rate and rhythm, S1, S2 normal, no murmur, click, rub or gallop  GI: +bs X4 quadrants. TTP LLQ with a large reducible hernia.  NGT removed. Extremities: extremities normal, atraumatic, no cyanosis or edema. +2 distal pulses   Lab Results:   Recent Labs  08/24/12 0643 31-Aug-2012 0505  WBC 15.8* 11.9*  HGB 12.0 10.6*  HCT 36.7 32.5*  PLT 384 329   BMET  Recent Labs  08/24/12 0230 08/24/12 0643 08-31-2012 0505  NA 136  --  135  K 4.1  --  4.2  CL 100  --  103  CO2  --   --  24  GLUCOSE 187*  --  131*  BUN 25*  --  21  CREATININE 0.70 0.77 0.52  CALCIUM  --   --  7.9*   Studies/Results: Dg Abd 2 Views  08/31/2012   *RADIOLOGY REPORT*  Clinical Data: Small bowel obstruction follow-up, NG tube.  ABDOMEN - 2 VIEW  Comparison: 08/24/2012  Findings: NG tube tip projects over the mid stomach.  There is persistent dilatation of small bowel loops, measuring up to 6.2 cm. No free intraperitoneal air.  Organ outlines normal where seen.  No acute osseous finding.  IMPRESSION: NG tube tip projects over the stomach.  Small bowel obstruction pattern persists with dilated small bowel loops up to 6.2 cm.   Original Report Authenticated By:  Jearld Lesch, M.D.   Dg Abd 2 Views  08/24/2012   *RADIOLOGY REPORT*  Clinical Data: Follow up small bowel obstruction  ABDOMEN - 2 VIEW  Comparison: 08/24/2012  Findings: The enteric tube tip is in the stomach.  Again noted are centralized dilated small bowel loops within the lower abdomen and pelvis.  These measure up to the 4.2 cm. No free air identified.  IMPRESSION:  1.  Enteric tube tip is in the stomach. 2.  Dilated small bowel loops within the central lower abdomen compatible with small bowel obstruction.   Original Report Authenticated By: Signa Kell, M.D.    Anti-infectives: Anti-infectives   Start     Dose/Rate Route Frequency Ordered Stop   08/24/12 0700  ciprofloxacin (CIPRO) IVPB 400 mg     400 mg 200 mL/hr over 60 Minutes Intravenous Every 12 hours 08/24/12 0647     08/24/12 0545  ciprofloxacin (CIPRO) IVPB 400 mg     400 mg 200 mL/hr over 60 Minutes Intravenous  Once 08/24/12 0534 08/24/12 0724      Assessment/Plan: Small bowel obstruction secondary to ventral hernia  -NG clamped yesterday, which she tolerated well.  I removed it this morning which she tolerated well.  Start on clear liquid today today.  Advance tomorrow morning.  White count is trending down, her pain has improved. -reduce IVF,  DC when oral intake improves. -pain control  -SCDs, lovenox and Ambulate -will discuss further need for ciprofloxacin -expect discharge 1-2 days   Diabetes Mellitus  -no recent POC CBGs, start BID AC -resume metformin 7/10 Hypertension  -resume home meds   LOS: 2 days   [facility] ANP-BC Pager [phone]  08/26/2012 10:54 AM

## 2012-08-26 NOTE — Progress Notes (Signed)
Agree with above, hopefully home tomorrow

## 2012-08-27 DIAGNOSIS — E119 Type 2 diabetes mellitus without complications: Secondary | ICD-10-CM

## 2012-08-27 DIAGNOSIS — I152 Hypertension secondary to endocrine disorders: Secondary | ICD-10-CM | POA: Diagnosis present

## 2012-08-27 DIAGNOSIS — Z8719 Personal history of other diseases of the digestive system: Secondary | ICD-10-CM | POA: Insufficient documentation

## 2012-08-27 DIAGNOSIS — K56609 Unspecified intestinal obstruction, unspecified as to partial versus complete obstruction: Secondary | ICD-10-CM | POA: Diagnosis present

## 2012-08-27 DIAGNOSIS — E1159 Type 2 diabetes mellitus with other circulatory complications: Secondary | ICD-10-CM | POA: Diagnosis present

## 2012-08-27 MED ORDER — ACETAMINOPHEN 325 MG PO TABS: 650.0000 mg | ORAL_TABLET | Freq: Four times a day (QID) | ORAL | Status: DC | PRN

## 2012-08-27 MED ORDER — PANTOPRAZOLE SODIUM 40 MG PO TBEC: 40.0000 mg | DELAYED_RELEASE_TABLET | Freq: Every day | ORAL | Status: DC

## 2012-08-27 MED ORDER — PANTOPRAZOLE SODIUM 40 MG PO TBEC
40.0000 mg | DELAYED_RELEASE_TABLET | Freq: Every day | ORAL | Status: DC
  Administered 2012-08-27: 40 mg via ORAL
  Filled 2012-08-27: qty 1

## 2012-08-27 MED ORDER — METFORMIN HCL ER 500 MG PO TB24: 500.0000 mg | ORAL_TABLET | Freq: Every day | ORAL | Status: DC

## 2012-08-27 MED ORDER — LISINOPRIL-HYDROCHLOROTHIAZIDE 10-12.5 MG PO TABS: 0.5000 | ORAL_TABLET | Freq: Every day | ORAL | Status: DC

## 2012-08-27 MED ORDER — PSYLLIUM 0.52 G PO CAPS: 0.5200 g | ORAL_CAPSULE | Freq: Every day | ORAL | Status: DC

## 2012-08-27 MED ORDER — HYDROCODONE-ACETAMINOPHEN 5-325 MG PO TABS: 1.0000 | ORAL_TABLET | Freq: Three times a day (TID) | ORAL | Status: DC | PRN

## 2012-08-27 NOTE — Progress Notes (Signed)
Agree with above 

## 2012-08-27 NOTE — Discharge Summary (Signed)
Physician Discharge Summary  Gabriela Watkins JXB:[id number] DOB: 03/23/1965 DOA: 08/24/2012  PCP: No PCP Per Patient  Consultation: none  Admit date: 08/24/2012 Discharge date: 08/27/2012  Recommendations for Outpatient Follow-up:   Follow-up Information   Follow up with WYATT, Marta Lamas, MD. (As needed if symptoms worsen)    Contact information:   [address]  [facility] [name] [address] [phone]       Follow up with No PCP Per Patient.   Contact information:   [address] [name] [address] [phone]      Discharge Diagnoses:  1. Small bowel obstruction 2. Ventral hernia 3. Hypertension 4. Diabetes mellitus  Surgical Procedure: none  Discharge Condition: stable Disposition: home  Diet recommendation: high fiber  Filed Weights   08/24/12 2205  Weight: 266 lb (120.657 kg)   Hospital Course:  Gabriela Watkins is a 47 year old hispainc speaking female with a history of diabetes mellitus, hypertension and obesity who presented to [facility] ED with abdominal pain, nausea and vomiting.  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 Leticia Penna.  Her work up showed a small bowel obstruction.  She was treated conservatively with NGT, NPO, pain control and IV hydration.  She was not a surgical candidate.  Her white count was slightly elevated at 15.8, decreased to 11.9.  She remained afebrile with stable vital signs.  She began having bowel movements, NGT was clamped and subsequently removed.  She was started on diet and advanced as tolerated which she did quite well.  She was treated with cipro for a suspected UTI, but was likely contaminated.  She received 2 days of atbx.  She denied dysuria.  Her metformin was held due to IV contrast, resumed.  Her blood pressure was resumed at 1/2 dose due to blood pressure of 95/57.  She will follow up with primary care for further  titration and monitoring.  The patient verbalized understanding.  I once again offered for translator, but wished to have family translate.  She was asked to call. Dr. Lindie Spruce for a follow up as needed.    Discharge Instructions    Medication List         acetaminophen 325 MG tablet  Commonly known as:  TYLENOL  Take 2 tablets (650 mg total) by mouth every 6 (six) hours as needed.     HYDROcodone-acetaminophen 5-325 MG per tablet  Commonly known as:  NORCO/VICODIN  Take 1 tablet by mouth every 8 (eight) hours as needed.     lisinopril-hydrochlorothiazide 10-12.5 MG per tablet  Commonly known as:  PRINZIDE,ZESTORETIC  Take 0.5 tablets by mouth daily.     lovastatin 20 MG tablet  Commonly known as:  MEVACOR  Take 20 mg by mouth at bedtime.     metFORMIN 500 MG 24 hr tablet  Commonly known as:  GLUCOPHAGE-XR  Take 500 mg by mouth daily with breakfast.     pantoprazole 40 MG tablet  Commonly known as:  PROTONIX  Take 1 tablet (40 mg total) by mouth daily at 12 noon.     psyllium 0.52 G capsule  Commonly known as:  METAMUCIL  Take 1 capsule (0.52 g total) by mouth daily.           Follow-up Information   Follow up with WYATT, Marta Lamas, MD. (As needed if symptoms worsen)    Contact information:  [address]  [facility] [place] [name] [address] [phone]       Follow up with No PCP Per Patient.   Contact information:   [address] [name] [address] [phone]        The results of significant diagnostics from this hospitalization (including imaging, microbiology, ancillary and laboratory) are listed below for reference.    Significant Diagnostic Studies: Ct Abdomen Pelvis W Contrast  08/24/2012   *RADIOLOGY REPORT*  Clinical Data: Abdominal pain and distension after hernia repair.  CT ABDOMEN AND PELVIS WITH CONTRAST  Technique:  Multidetector CT imaging of the abdomen and pelvis was performed following the standard  protocol during bolus administration of intravenous contrast.  Contrast: OMNIPAQUE IOHEXOL 300 MG/ML  SOLN  Comparison: 09/19/2009  Findings: Motion artifact in the lung bases.  No discrete consolidation.  There is a large midline ventral abdominal wall hernia.  This displaces the rectus abdominous muscles with hernia opening measuring up to 1.4 cm.  The hernia contains fat, colon, and small bowel.  There is gastric distension with an air-fluid level.  There is proximal small bowel distension.  There is decompression of the distal small bowel with transition zone noted inside the hernia. Changes are consistent with small bowel obstruction.  The colon is decompressed but stool filled.  The liver, spleen, gallbladder, pancreas, adrenal glands, kidneys, abdominal aorta, inferior vena cava, and retroperitoneal lymph nodes are unremarkable.  No free fluid or free air in the abdomen. Portal and mesenteric vessels are patent.  Pelvis:  The uterus and ovaries are not enlarged.  Probable involuting cyst in the right ovary.  Surgical clips in the right lower quadrant suggest appendectomy.  No evidence of diverticulitis.  No free or loculated pelvic fluid collections.  No significant pelvic lymphadenopathy.  Normal alignment of the lumbar vertebrae.  IMPRESSION: Large ventral abdominal wall hernia at the midline containing small bowel, colon, and fat.  Small bowel obstruction with transition zone within the hernia.   Original Report Authenticated By: Burman Nieves, M.D.   Dg Abd 2 Views  08/25/2012   *RADIOLOGY REPORT*  Clinical Data: Small bowel obstruction follow-up, NG tube.  ABDOMEN - 2 VIEW  Comparison: 08/24/2012  Findings: NG tube tip projects over the mid stomach.  There is persistent dilatation of small bowel loops, measuring up to 6.2 cm. No free intraperitoneal air.  Organ outlines normal where seen.  No acute osseous finding.  IMPRESSION: NG tube tip projects over the stomach.  Small bowel obstruction  pattern persists with dilated small bowel loops up to 6.2 cm.   Original Report Authenticated By: Jearld Lesch, M.D.   Dg Abd 2 Views  08/24/2012   *RADIOLOGY REPORT*  Clinical Data: Follow up small bowel obstruction  ABDOMEN - 2 VIEW  Comparison: 08/24/2012  Findings: The enteric tube tip is in the stomach.  Again noted are centralized dilated small bowel loops within the lower abdomen and pelvis.  These measure up to the 4.2 cm. No free air identified.  IMPRESSION:  1.  Enteric tube tip is in the stomach. 2.  Dilated small bowel loops within the central lower abdomen compatible with small bowel obstruction.   Original Report Authenticated By: Signa Kell, M.D.    Microbiology: Recent Results (from the past 240 hour(s))  URINE CULTURE     Status: None   Collection Time    08/24/12  2:50 AM      Result Value Range Status  Specimen Description URINE, CLEAN CATCH   Final   Special Requests ADDED 0320   Final   Culture  Setup Time 08/24/2012 03:28   Final   Colony Count >=100,000 COLONIES/ML   Final   Culture     Final   Value: Multiple bacterial morphotypes present, none predominant. Suggest appropriate recollection if clinically indicated.   Report Status 08/25/2012 FINAL   Final     Labs: Basic Metabolic Panel:  Recent Labs Lab 08/24/12 0230 08/24/12 0643 08/25/12 0505  NA 136  --  135  K 4.1  --  4.2  CL 100  --  103  CO2  --   --  24  GLUCOSE 187*  --  131*  BUN 25*  --  21  CREATININE 0.70 0.77 0.52  CALCIUM  --   --  7.9*   Liver Function Tests:  Recent Labs Lab 08/24/12 0220  AST 15  ALT 16  ALKPHOS 111  BILITOT 0.3  PROT 8.4*  ALBUMIN 4.1    Recent Labs Lab 08/24/12 0220  LIPASE 36   CBC:  Recent Labs Lab 08/24/12 0220 08/24/12 0230 08/24/12 0643 08/25/12 0505  WBC 18.9*  --  15.8* 11.9*  NEUTROABS 16.1*  --   --   --   HGB 13.4 15.3* 12.0 10.6*  HCT 39.4 45.0 36.7 32.5*  MCV 74.6*  --  75.2* 75.6*  PLT 447*  --  384 329    CBG:  Recent Labs Lab 08/24/12 0235 08/26/12 1753  GLUCAP 173* 100*    Active Problems:   Small bowel obstruction   Type II or unspecified type diabetes mellitus without mention of complication, not stated as uncontrolled   Essential hypertension, benign   Obesity, morbid   Time coordinating discharge: 30 mins  Signed:  Nicolae Vasek, ANP-BC

## 2012-08-27 NOTE — Progress Notes (Signed)
DC instructions given to pt re: f/u appts, meds, pain control, s/s of problems to report to md, and information re: small bowel obstruction.  Pt has no s/s of any acute distress or pain.

## 2012-08-27 NOTE — Progress Notes (Signed)
  Subjective: Pt doing great, denies abdominal pain.  Had 2 bms since yesterday.  Tolerated clears.  Ambulating in the hallways.  Voiding without any difficulties.  States she is ready to go home.    Objective: Vital signs in last 24 hours: Temp:  [97.9 F (36.6 C)-98.7 F (37.1 C)] 98.7 F (37.1 C) (07/09 2207) Pulse Rate:  [71-73] 72 (07/09 2207) Resp:  [16] 16 (07/09 2207) BP: (116-119)/(47-59) 116/47 mmHg (07/09 2207) SpO2:  [99 %-100 %] 100 % (07/09 2207) Last BM Date: 08/25/12  Intake/Output from previous day: 07/09 0701 - 07/10 0700 In: 720 [P.O.:720] Out: -  Intake/Output this shift:   PE  General appearance: alert, cooperative, appears stated age, no distress and moderate distress  Resp: clear to auscultation bilaterally  Cardio: regular rate and rhythm, S1, S2 normal, no murmur, click, rub or gallop  GI: +bs X4 quadrants. No TTP large reducible hernia.  Extremities: extremities normal, atraumatic, no cyanosis or edema. +2 distal pulses   Lab Results:   Recent Labs  08/25/12 0505  WBC 11.9*  HGB 10.6*  HCT 32.5*  PLT 329   BMET  Recent Labs  08/25/12 0505  NA 135  K 4.2  CL 103  CO2 24  GLUCOSE 131*  BUN 21  CREATININE 0.52  CALCIUM 7.9*   Anti-infectives: Anti-infectives   Start     Dose/Rate Route Frequency Ordered Stop   08/24/12 0700  ciprofloxacin (CIPRO) IVPB 400 mg  Status:  Discontinued     400 mg 200 mL/hr over 60 Minutes Intravenous Every 12 hours 08/24/12 0647 08/26/12 1411   08/24/12 0545  ciprofloxacin (CIPRO) IVPB 400 mg     400 mg 200 mL/hr over 60 Minutes Intravenous  Once 08/24/12 0534 08/24/12 0724      Assessment/Plan: Small bowel obstruction secondary to ventral hernia  -resolved.  Advance diet, if tolerates lunch discharge home. Diabetes Mellitus  -resume metformin  Hypertension  -stable   LOS: 3 days   [facility] ANP-BC Pager [phone]  08/27/2012 9:25 AM

## 2012-08-31 ENCOUNTER — Telehealth (INDEPENDENT_AMBULATORY_CARE_PROVIDER_SITE_OTHER): Payer: Self-pay | Admitting: General Surgery

## 2012-08-31 NOTE — Telephone Encounter (Signed)
Patient calling to make follow up appt with Dr Lindie Spruce. Per discharge summary appt only needed if symptoms persist. Per patient things are better. They will call as needed.

## 2013-08-02 ENCOUNTER — Ambulatory Visit: Payer: Self-pay | Admitting: Family

## 2013-08-02 ENCOUNTER — Encounter (INDEPENDENT_AMBULATORY_CARE_PROVIDER_SITE_OTHER): Payer: Self-pay

## 2013-08-02 ENCOUNTER — Encounter: Payer: Self-pay | Admitting: Family

## 2013-08-02 VITALS — BP 129/72 | HR 87 | Temp 98.0°F | Ht 62.0 in | Wt 273.8 lb

## 2013-08-02 DIAGNOSIS — E1169 Type 2 diabetes mellitus with other specified complication: Secondary | ICD-10-CM | POA: Insufficient documentation

## 2013-08-02 DIAGNOSIS — I10 Essential (primary) hypertension: Secondary | ICD-10-CM

## 2013-08-02 DIAGNOSIS — E785 Hyperlipidemia, unspecified: Secondary | ICD-10-CM

## 2013-08-02 DIAGNOSIS — E119 Type 2 diabetes mellitus without complications: Secondary | ICD-10-CM

## 2013-08-02 LAB — POCT GLYCOSYLATED HEMOGLOBIN (HGB A1C): HEMOGLOBIN A1C: 6.4

## 2013-08-02 MED ORDER — LISINOPRIL-HYDROCHLOROTHIAZIDE 10-12.5 MG PO TABS: 1.0000 | ORAL_TABLET | Freq: Every day | ORAL | Status: DC

## 2013-08-02 MED ORDER — LOVASTATIN 20 MG PO TABS: 20.0000 mg | ORAL_TABLET | Freq: Every day | ORAL | Status: DC

## 2013-08-02 MED ORDER — METFORMIN HCL ER 500 MG PO TB24: 500.0000 mg | ORAL_TABLET | Freq: Every day | ORAL | Status: DC

## 2013-08-02 NOTE — Patient Instructions (Signed)
Recuento bsico de carbohidratos  (Basic Carbohydrate Counting) El recuento bsico de carbohidratos es una forma de planificar las comidas. Se realiza contando la cantidad de carbohidratos que contienen los alimentos. Los alimentos que contienen carbohidratos son las fculas (cereales, legumbres, vegetales que contienen almidn) y los dulces. El consumo de carbohidratos aumenta el nivel de glucosa (azcar) en la sangre. Las personas con diabetes cuentan los carbohidratos para Theatre manager un nivel normal de glucosa en la [place].  RECUENTO DE CARBOHIDRATOS EN LOS ALIMENTOS  El primer paso para contar los carbohidratos es conocer cuntas porciones de carbohidratos debe consumir en cada comida. Un nutricionista puede planificar esto para usted. Despus de conocer la cantidad de carbohidratos que debe incluir en su plan de comidas, puede comenzar a elegir los alimentos que desee comer que contengan carbohidratos.  Hay 2 maneras de identificar la cantidad de carbohidratos en los alimentos que comemos.   Lea la Informacin Nutricional en las etiquetas de los alimentos.  Se necesitan 2 partes de informacin contenida en la Informacin Nutricional para contar los carbohidratos de este modo:  Tamao de la porcin.  Carbohidratos totales (en gramos). Decida cuntas porciones va a comer. Si es una porcin, va a comer la cantidad de carbohidratos que aparece en la etiqueta. Si va a comer 2 porciones, consumir el doble de la cantidad de carbohidratos que aparece en la etiqueta.   Conozca el tamao de las porciones.  Una porcin de la mayora de los alimentos que contienen carbohidratos es de aproximadamente 15 gramos (g). A continuacin se enumeran las porciones individuales de alimentos comunes que contienen carbohidratos:  1 rebanada de pan.   de taza de cereal sin azcar y seco.   taza de cereal caliente.   taza de arroz.   taza de pur de patatas.   taza de pasta.  1 taza de fruta fresca.    taza de fruta enlatada.  1 taza de leche (entera, 2% o descremada).   taza de vegetales que contengan almidn (guisantes, maz o patatas). Contar los carbohidratos de [place] es similar a Psychologist, counselling de informacin nutricional. Primero decida cuntas porciones va a comer. Multiplique el nmero de porciones que usted come por 15 g. Por ejemplo, si usted come 2 tazas de fresas, ha tomado 2 porciones. Eso significa que usted ha consumido 30 g de hidratos de carbono (2 porciones x 15 g = 30 g).  CLCULO DE HIDRATOS DE CARBONO EN UNA COMIDA  Ejemplo de cena   3 onzas (85 g) de pechugas de pollo   taza de arroz integral.   taza de maz.  1 taza de leche descremada.  1 taza de fresas con crema batida sin azcar . Clculo de carbohidratos  En Midwife, identifique los alimentos que contienen hidratos de carbono:   Arroz.  Maz.  Leche.  Hughie Closs. Calcular el nmero de porciones consumidas:   2 porciones de arroz.  1 racin de maz.  [place].  1 porcin de fresas. Multiplique el nmero de porciones por 15 g:   2 porciones de arroz x 15 g = 30 g.  1 porcin de maz x 15 g = 15 g.  1 porcin de leche x 15 g = 15 g.  1 porcin de fresas x 15 g = 15 g. Sume las cantidades para Paramedic total de carbohidratos consumidos:  30 g + 15 g + 15 g + 15 g = 75 g de carbohidratos consumidos en la cena.  Document Released: 04/29/2011 [facility] Patient Information 2014 [place], [place]. Mantenimiento de Engineer, maintenance (IT) las mujeres (Health Maintenance, Females) Un estilo de vida saludable y los cuidados preventivos pueden favorecer la salud y [place].   Haga exmenes regulares de la salud en general, dentales y de los ojos.  Consuma una dieta saludable. Los [employer], frutas, granos enteros, productos lcteos descremados y protenas magras contienen los nutrientes que usted necesita sin necesidad de consumir muchas caloras. Disminuya el  consumo de alimentos con alto contenido de grasas slidas, azcar y sal agregadas. Si es necesario, pdale informacin acerca de [place] a su mdico.  La actividad fsica regular es una de las cosas ms importantes que puede hacer por su salud. Los adultos deben hacer al menos 150 minutos de ejercicios de intensidad moderada (cualquier actividad que aumente la frecuencia cardaca y lo haga transpirar) cada semana. Adems, la [employer] de los adultos necesita ejercicios de fortalecimiento muscular 2  ms [employer].   Mantenga un peso saludable. El ndice de masa corporal [facility]) es una herramienta que identifica posibles problemas con [place]. Proporciona una estimacin de la grasa corporal basndose en el peso y la altura. El mdico podr determinar su [facility] y podr ayudarlo a Personnel officer o Pharmacologist un peso saludable. Para los adultos de 20 aos o ms:  Un [facility] menor a 18,5 se considera bajo peso.  Un [facility] entre 18,5 y 24,9 es normal.  Un [facility] entre 25 y 29,9 es sobrepeso.  Un IMC entre 30 o ms es obesidad.  Mantenga un nivel normal de lpidos y colesterol en sangre practicando actividad fsica y minimizando la ingesta de grasas saturadas. Consuma una dieta balanceada e incluya variedad de frutas y vegetales. Los [employer] de lpidos y Oncologist en sangre deben Games developer a los 20 aos y repetirse cada 5 aos. Si los niveles de colesterol son altos, tiene ms de 50 aos o tiene riesgo elevado de sufrir enfermedades cardacas, Pension scheme manager controlarse con ms frecuencia.Si tiene [employer] de lpidos y colesterol, debe recibir tratamiento con medicamentos, si la dieta y el ejercicio no son efectivos.  Si fuma, consulte con [employer] de las opciones para dejar de [place]. Si no lo hace, no comience.  Se recomienda realizar controles para el cncer de pulmn a los [employer] de 55 a 80 aos que tengan alto riesgo de Environmental education officer la enfermedad debido a sus antecedentes de  tabaquismo. Se recomienda realizar una tomografa computarizada (TC) anual de dosis bajas en aquellas personas tienen un promedio de 30 paquetes al ao de historia de tabaquismo y en la actualidad fuman o han dejado de fumar dentro de los ltimos 15 aos. Un paquete al ao de fumador es fumar un promedio de 1 paquete de cigarrillos al da durante 1 ao (por ejemplo: 1 paquete al da durante 30 aos o dos paquetes al da durante 15 aos). El control anual debe continuar hasta que el fumador haya dejado de fumar durante al menos 15 aos. Screening anual debe interrumpirse en aquellas personas que desarrollan un problema de salud que les impida seguir un tratamiento para el cncer de pulmn.  Si est embarazada no beba alcohol. Si est amamantando, beba alcohol con prudencia. Si elige beber alcohol, no se exceda de 1 medida por da. Se considera una medida a 12 onzas (355 ml) de cerveza, 5 onzas (148 ml) de vino, o 1,5 onzas (44 ml) de licor.  No comparta agujas con nadie. Pida ayuda si necesita  asistencia o instrucciones con respecto a abandonar el consumo de alcohol, cigarrillos o drogas.  La hipertensin arterial causa enfermedades cardacas y [place] el riesgo de ictus. Debe controlar su presin arterial al menos cada 1 o 2 aos. La presin arterial elevada que persiste debe tratarse con medicamentos si la prdida de peso y el ejercicio no son efectivos.  Si tiene entre 7 y 18 aos, consulte a su mdico si debe tomar aspirina para prevenir enfermedades cardacas.  Los anlisis para la diabetes incluyen la toma de [place] de sangre para controlar el nivel de azcar en la sangre durante el [place]. Debe hacerlo cada 3 aos despus de los 65 aos si est dentro de su peso normal y sin factores de riesgo para la diabetes. Las pruebas deben comenzar a edades tempranas o llevarse a cabo con ms frecuencia si tiene sobrepeso y al menos 1 factor de riesgo para la diabetes.  Las evaluaciones para Hydrographic surveyor de mama son un mtodo preventivo fundamental para las mujeres. Debe practicar la "autoconciencia de las mamas". Esto significa que Chief Technology Officer apariencia normal de sus mamas y [place] y pudiendo incluir un autoexamen de Glass blower/designer. Si detecta algn cambio, no importa cun pequeo sea, debe informarlo a su mdico. Las mujeres entre 20 y 70 aos deben hacer un examen clnico de las mamas como parte del examen regular de [place], cada 1 a 3 aos. Despus de los 40 aos deben [employer]. Deben hacerse una mamografa radografa de mamas ) cada ao, comenzando a los 40 aos. Las mujeres con Editor, commissioning de cncer de mama deben hablar con el mdico para hacer un estudio gentico. Las que tienen ms riesgo deben Electrical engineer resonancia magntica y [employer] todos [place].  La evaluacin del riesgo de sufrir cncer relacionado con el cncer de mama gentico (BRCA) se recomienda en aquellas mujeres que tienen familiares con cncer relacionados con el BRCA El cncer relacionado con BRCA incluye el cncer de mama, de ovario, de trompas y peritoneal. El hecho de tener familiares con estos tipos de cncer puede asociarse con un mayor riesgo de a sufrir cambios perjudiciales (mutaciones) en los genes para el cncer de mama BRCA1 y BRCA2. Los resultados de la evaluacin determinar la necesidad de asesoramiento gentico BRCA1 y BRCA2 y [place]. Los resultados de la evaluacin determinarn la necesidad de asesoramiento gentico y estudios para el BRCA1 y BRCA2.  Un [place] se realiza para diagnosticar cncer de cuello de tero. Las mujeres deben hacerse un Papanicolau a Proofreader de los 21 aos. [employer] 21 y los 29 aos debe repetirse [place]. Luego de los 30 aos, debe realizarse un Papanicolau cada tres aos siempre que los 3 estudios anteriores sean normales. Si le han realizado una histerectoma por un problema que no era cncer u otra enfermedad que podra causar cncer, ya no  necesitar un [place]apanicolau. Si tiene entre 64 y 75 aos y ha tenido Engineer, production normal en los ltimos 10 aos, ya no ser IT sales professional. Si ha recibido un tratamiento para Science writer cervical o para una enfermedad que podra causar cncer, Dietitian un [place] y controles durante al menos 70 aos de concluir el [place]. Si no se ha Futures trader con regularidad, Chief Executive Officer a evaluarse los factores de riesgo (como el tener un nuevo compaero sexual) para Programmer, applications a Actuary. Algunas mujeres sufren problemas mdicos que aumentan la probabilidad de Optician, dispensing  cervical. En estos casos, el mdico podr indicar que se realice el [place] con ms frecuencia.  La prueba del virus del Engineer, technical sales (VPH) es un anlisis adicional que puede usarse para Film/video editor de cuello de tero. Esta prueba busca la presencia del virus que causa los cambios en el cuello. Las [employer] se recolectan durante el [place]apanicolau pueden usarse para el VPH. La prueba para el VPH puede usarse para Chief of Staff a mujeres de ms de 38 aos y debe usarse en mujeres de cualquier edad [employer] del [place] no sean claros. Despus de los 30 aos, las mujeres deben hacerse el anlisis para el VPH con la misma frecuencia que el [place].  El cncer colorectal puede detectarse y con frecuencia puede prevenirse. La mayor parte de los estudios de rutina comienzan a los 30 aos y [employer] 63 aos. Sin embargo, el mdico podr aconsejarle que lo haga antes, si tiene factores de riesgo para el cncer de colon. Una vez por ao, el profesional le dar un kit de prueba para Hydrologist en la materia fecal. La utilizacin de un tubo con una pequea cmara en su extremo para examinar directamente el colon (sigmoidoscopa o colonoscopa), puede detectar formas temprana de cncer colorectal. Hable con su mdico si tiene 24 aos, cuando comience con los  estudios de [place]. El examen directo del colon debe repetirse cada 5 a 10 aos, hasta los 75 aos, excepto que se encuentren formas tempranas de plipos precancerosos o pequeos bultos.  Se recomienda realizar un anlisis de sangre para Hydrographic surveyor hepatitis C a todas las personas nacidas entre 1945 y 1965, y a todo aquel que tenga un riesgo conocido de haber contrado esta enfermedad.  Practique el sexo seguro. Use condones y evite las prcticas sexuales riesgosas para disminuir el contagio de enfermedades de transmisin sexual. Las mujeres sexualmente activas de 25 aos o menos deben controlarse para descartar clamidia, que es una infeccin de transmisin sexual frecuente. Las [employer] que tengan mltiples compaeros tambin deben hacerse el anlisis para Hydrographic surveyor clamidia. Se recomienda realizar anlisis para detectar otras enfermedades de transmisin sexual si es sexualmente [place] y tiene riesgos.  La osteoporosis es una enfermedad en la que los huesos pierden los minerales y la fuerza por el avance de la edad. El resultado pueden ser fracturas graves en los [employer]. El riesgo de osteoporosis puede identificarse con Ardelia Mems prueba de densidad sea. Las mujeres de ms de 76 aos y las que tengan riesgos de sufrir fracturas u osteoporosis deben pedir consejo a su mdico. Consulte a su mdico si debe tomar un suplemento de calcio o de vitamina D para reducir el riesgo de osteoporosis.  La menopausia se asocia a sntomas y riesgos fsicos. Se dispone de una terapia de reemplazo hormonal para disminuir los sntomas y [place]. Consulte a su mdico para saber si la terapia de reemplazo hormonal es conveniente para usted.  Use una pantalla solar. Aplique pantalla de Kerry Dory y repetida a lo largo del Training and development officer. Pngase al resguardo del sol cuando la sombra sea ms pequea que usted. Protjase usando mangas y [employer], un sombrero de ala ancha y gafas para el sol todo el ao, siempre que se  encuentre en el exterior.  Informe a su mdico si aparecen nuevos lunares o los que tiene se modifican, especialmente en forma y color. Tambin notifique al mdico si un lunar es ms grande que el tamao de una goma de Games developer.  [place] con  las vacunas. Document Released: 01/24/2011 Document Revised: 06/01/2012 [facility] Patient Information 2014 [place], [place].

## 2013-08-02 NOTE — Progress Notes (Signed)
Subjective:    Patient ID: Gabriela Watkins, female    DOB: 12/07/1965, 48 y.o.   MRN: [id number]  Hypertension This is a chronic problem. The current episode started more than 1 year ago. The problem has been resolved since onset. The problem is controlled. Associated symptoms include peripheral edema. Pertinent negatives include no anxiety, chest pain, headaches, palpitations or shortness of breath. Risk factors for coronary artery disease include diabetes mellitus, dyslipidemia and obesity. Past treatments include ACE inhibitors and diuretics. The current treatment provides significant improvement. Compliance problems include exercise.  There is no history of kidney disease, CAD/MI or a thyroid problem.  Diabetes She presents for her follow-up diabetic visit. She has type 2 diabetes mellitus. Her disease course has been stable. There are no hypoglycemic associated symptoms. Pertinent negatives for hypoglycemia include no dizziness or headaches. Pertinent negatives for diabetes include no chest pain, no foot paresthesias and no foot ulcerations. There are no hypoglycemic complications. Risk factors for coronary artery disease include diabetes mellitus, dyslipidemia, hypertension and sedentary lifestyle. Current diabetic treatment includes oral agent (monotherapy). She is compliant with treatment all of the time. She is following a generally unhealthy diet. She rarely participates in exercise. An ACE inhibitor/angiotensin II receptor blocker is being taken. Eye exam is not current (Pt educated on importance of yearly eye exams).  Hyperlipidemia This is a chronic problem. The current episode started more than 1 year ago. The problem is controlled. Recent lipid tests were reviewed and are normal. Exacerbating diseases include diabetes and obesity. She has no history of hypothyroidism. Factors aggravating her hyperlipidemia include fatty foods. Pertinent negatives include no chest pain, leg pain or shortness of  breath. Current antihyperlipidemic treatment includes statins. The current treatment provides moderate improvement of lipids. Risk factors for coronary artery disease include diabetes mellitus, dyslipidemia, hypertension and obesity.      Review of Systems  HENT: Positive for congestion. Negative for postnasal drip, sinus pressure and sneezing.   Respiratory: Negative.  Negative for shortness of breath.   Cardiovascular: Negative for chest pain and palpitations.  Genitourinary: Negative.   Musculoskeletal: Negative.   Neurological: Negative for dizziness and headaches.  All other systems reviewed and are negative.      Objective:   Physical Exam  Vitals reviewed. Constitutional: She is oriented to person, place, and time. She appears well-developed and well-nourished. No distress.  HENT:  Head: Normocephalic and atraumatic.  Right Ear: External ear normal.  Mouth/Throat: Oropharynx is clear and moist.  Eyes: Pupils are equal, round, and reactive to light.  Neck: Normal range of motion. Neck supple. No thyromegaly present.  Cardiovascular: Normal rate, regular rhythm, normal heart sounds and intact distal pulses.   No murmur heard. Pulmonary/Chest: Effort normal and breath sounds normal. No respiratory distress. She has no wheezes.  Abdominal: Soft. Bowel sounds are normal. She exhibits no distension. There is no tenderness.  Musculoskeletal: Normal range of motion. She exhibits edema (trace amt in bilateral feet). She exhibits no tenderness.  Neurological: She is alert and oriented to person, place, and time. She has normal reflexes. No cranial nerve deficit.  Skin: Skin is warm and dry.  Psychiatric: She has a normal mood and affect. Her behavior is normal. Judgment and thought content normal.     BP 129/72  Pulse 87  Temp(Src) 98 F (36.7 C) (Oral)  Ht _0  (1.575 m)  Wt 273 lb 12.8 oz (124.195 kg)  BMI 50.07 kg/m2  See diabetic foot note  Assessment & Plan:  1.  Essential hypertension, benign - lisinopril-hydrochlorothiazide (PRINZIDE,ZESTORETIC) 10-12.5 MG per tablet; Take 1 tablet by mouth daily.  Dispense: 30 tablet; Refill: 6 - CMP14+EGFR  2. Type II or unspecified type diabetes mellitus without mention of complication, not stated as uncontrolled - metFORMIN (GLUCOPHAGE-XR) 500 MG 24 hr tablet; Take 1 tablet (500 mg total) by mouth daily with breakfast.  Dispense: 30 tablet; Refill: 6 - POCT glycosylated hemoglobin (Hb A1C)  3. Hyperlipidemia - lovastatin (MEVACOR) 20 MG tablet; Take 1 tablet (20 mg total) by mouth at bedtime.  Dispense: 30 tablet; Refill: 6 - Lipid panel   Continue all meds Labs pending Health Maintenance reviewed-Pt to make eye exam Diet and exercise encouraged RTO 6 months  Evelina Dun, FNP

## 2013-08-03 ENCOUNTER — Other Ambulatory Visit: Payer: Self-pay | Admitting: Family

## 2013-08-03 LAB — LIPID PANEL
CHOL/HDL RATIO: 5.4 ratio — AB (ref 0.0–4.4)
Cholesterol, Total: 205 mg/dL — ABNORMAL HIGH (ref 100–199)
HDL: 38 mg/dL — ABNORMAL LOW (ref >39)
LDL Calculated: 137 mg/dL — ABNORMAL HIGH (ref 0–99)
Triglycerides: 152 mg/dL — ABNORMAL HIGH (ref 0–149)
VLDL Cholesterol Cal: 30 mg/dL (ref 5–40)

## 2013-08-03 LAB — CMP14+EGFR
A/G RATIO: 1.9 (ref 1.1–2.5)
ALK PHOS: 105 IU/L (ref 39–117)
ALT: 18 IU/L (ref 0–32)
AST: 22 IU/L (ref 0–40)
Albumin: 4.4 g/dL (ref 3.5–5.5)
BUN / CREAT RATIO: 21 (ref 9–23)
BUN: 10 mg/dL (ref 6–24)
CO2: 23 mmol/L (ref 18–29)
CREATININE: 0.48 mg/dL — AB (ref 0.57–1.00)
Calcium: 8.9 mg/dL (ref 8.7–10.2)
Chloride: 102 mmol/L (ref 97–108)
GFR calc Af Amer: 137 mL/min/{1.73_m2} (ref >59)
GFR, EST NON AFRICAN AMERICAN: 119 mL/min/{1.73_m2} (ref >59)
GLOBULIN, TOTAL: 2.3 g/dL (ref 1.5–4.5)
Glucose: 112 mg/dL — ABNORMAL HIGH (ref 65–99)
Potassium: 4 mmol/L (ref 3.5–5.2)
SODIUM: 140 mmol/L (ref 134–144)
Total Bilirubin: 0.3 mg/dL (ref 0.0–1.2)
Total Protein: 6.7 g/dL (ref 6.0–8.5)

## 2013-08-03 MED ORDER — LOVASTATIN 40 MG PO TABS: 40.0000 mg | ORAL_TABLET | Freq: Every day | ORAL | Status: DC

## 2013-09-30 ENCOUNTER — Encounter: Payer: Self-pay | Admitting: *Deleted

## 2014-02-03 ENCOUNTER — Ambulatory Visit: Payer: Self-pay | Admitting: Family

## 2014-02-03 ENCOUNTER — Ambulatory Visit: Payer: Self-pay | Admitting: Family Medicine

## 2014-04-19 ENCOUNTER — Ambulatory Visit: Payer: Self-pay | Admitting: Family Medicine

## 2014-05-09 ENCOUNTER — Encounter: Payer: Self-pay | Admitting: Family

## 2014-05-09 ENCOUNTER — Ambulatory Visit (INDEPENDENT_AMBULATORY_CARE_PROVIDER_SITE_OTHER): Payer: 59 | Admitting: Family

## 2014-05-09 VITALS — BP 147/78 | HR 79 | Temp 98.4°F | Ht 62.0 in | Wt 275.0 lb

## 2014-05-09 DIAGNOSIS — I10 Essential (primary) hypertension: Secondary | ICD-10-CM

## 2014-05-09 DIAGNOSIS — Z1321 Encounter for screening for nutritional disorder: Secondary | ICD-10-CM | POA: Diagnosis not present

## 2014-05-09 DIAGNOSIS — E785 Hyperlipidemia, unspecified: Secondary | ICD-10-CM

## 2014-05-09 DIAGNOSIS — E1165 Type 2 diabetes mellitus with hyperglycemia: Secondary | ICD-10-CM | POA: Diagnosis not present

## 2014-05-09 LAB — POCT GLYCOSYLATED HEMOGLOBIN (HGB A1C): HEMOGLOBIN A1C: 6.5

## 2014-05-09 LAB — POCT UA - MICROALBUMIN: Microalbumin Ur, POC: 20 mg/L

## 2014-05-09 MED ORDER — LOVASTATIN 40 MG PO TABS: 40.0000 mg | ORAL_TABLET | Freq: Every day | ORAL | Status: DC

## 2014-05-09 MED ORDER — LISINOPRIL-HYDROCHLOROTHIAZIDE 10-12.5 MG PO TABS: 1.0000 | ORAL_TABLET | Freq: Every day | ORAL | Status: DC

## 2014-05-09 MED ORDER — METFORMIN HCL ER 500 MG PO TB24: 500.0000 mg | ORAL_TABLET | Freq: Every day | ORAL | Status: DC

## 2014-05-09 NOTE — Patient Instructions (Signed)
Diabetes mellitus tipo2 (Type 2 Diabetes Mellitus) La diabetes mellitus tipo2, generalmente denominada diabetes tipo2, es una enfermedad prolongada (crnica). En la diabetes tipo2, el pncreas no produce suficiente insulina (una hormona), las clulas son menos sensibles a la insulina que se produce (resistencia a la insulina), o ambos. Normalmente, la [employer] azcares de los alimentos a las clulas de los tejidos. Las clulas de los tejidos [employer] azcares para Dealer. La falta de insulina o la falta de una respuesta normal a la insulina hace que el exceso de azcar se acumule en la sangre en lugar de Location manager en las clulas de los tejidos. Como resultado, se producen niveles altos de Dispensing optician (hiperglucemia). El efecto de los niveles altos de azcar (glucosa) puede causar muchas complicaciones.  La diabetes tipo2 antes tambin se denominaba diabetes del [place], pero puede ocurrir a Hotel manager.  [place] persona tiene mayor predisposicin a desarrollar diabetes tipo 2 si alguien en su familia tiene la enfermedad y tambin tiene uno o ms de los siguientes factores de riesgo principales:  Sobrepeso.  Estilo de vida sedentario.  Una historia de consumo constante de alimentos de altas caloras. Mantener un peso saludable y realizar actividad fsica regular puede reducir la probabilidad de desarrollar diabetes tipo 2. SNTOMAS  Una persona con diabetes tipo 2 no presenta sntomas en un principio. Los sntomas de la diabetes tipo 2 aparecen lentamente. Los sntomas son:  Aumento de la sed (polidipsia).  Aumento de la miccin (poliuria).  Orina con ms frecuencia durante la noche (nocturia).  Prdida de peso. La prdida de peso puede ser muy rpida.  Infecciones frecuentes y recurrentes.  Cansancio (fatiga).  Debilidad.  Cambios en la visin, como visin borrosa.  Olor a Medical illustrator.  Dolor abdominal.  Nuseas o  vmitos.  Cortes o moretones que tardan en sanarse.  Hormigueo o adormecimiento de las manos y los pies. DIAGNSTICO Con frecuencia la diabetes tipo 2 no se diagnostica hasta que se presentan las complicaciones de la diabetes. La diabetes tipo 2 se diagnostica cuando los sntomas o las complicaciones se presentan y cuando aumentan los niveles de glucosa en la [place]. El nivel de glucosa en la sangre puede controlarse en uno o ms de los siguientes anlisis de sangre:  Medicin de glucosa en la sangre en [place]. No se le permitir comer durante al menos 8 horas antes de que se tome [place].  Pruebas al azar de glucosa en la sangre. El nivel de glucosa en la sangre se controla en cualquier momento del da sin importar el momento en que haya comido.  Prueba de A1c (hemoglobina glucosilada) Una prueba de A1c proporciona informacin sobre el control de la glucosa en la sangre durante los ltimos 3 meses.  Prueba de tolerancia a la glucosa oral (PTGO). La glucosa en la sangre se mide despus de no haber comido (ayunas) durante dos horas y despus de beber una bebida que contenga glucosa. TRATAMIENTO   Usted puede necesitar administrarse insulina o medicamentos para la diabetes todos los das para [name] niveles de glucosa en la sangre en el rango deseado.  Si [place] insulina, tal vez necesite ajustar la dosis segn los carbohidratos que haya consumido en cada comida o colacin. El objetivo del tratamiento es mantener el nivel de azcar en la sangre previo a comer (glucosa preprandial) entre 37 y 142m/dl. INSTRUCCIONES PARA EL CUIDADO EN EL HOGAR   Controle su nivel de  hemoglobina A1c dos veces al ao.  Contrlese a diario Retail buyer de glucosa en la sangre segn las indicaciones de su mdico.  Supervise las cetonas en la orina cuando est enferma y segn las indicaciones de su [place] medicamento para la diabetes o adminstrese insulina segn las indicaciones de su  mdico para Contractor nivel de glucosa en la sangre en el rango deseado.  Nunca se quede sin medicamento para la diabetes o sin insulina. Es necesario que la reciba [employer].  Si [place] insulina, tal vez deba ajustar la cantidad de insulina administrada segn los carbohidratos consumidos. Los hidratos de carbono pueden aumentar los niveles de glucosa en la sangre, pero deben incluirse en su dieta. Los hidratos de carbono aportan vitaminas, minerales y [place] que son Ardelia Mems parte esencial de una dieta saludable. Los hidratos de carbono se encuentran en frutas, verduras, cereales integrales, productos lcteos, legumbres y alimentos que contienen azcares aadidos.  Consuma alimentos saludables. Programe una cita con un nutricionista certificado para que lo ayude a Building services engineer de alimentacin adecuado para usted.  Baje de peso si es necesario.  Lleve una tarjeta de alerta mdica o use una pulsera o medalla de alerta mdica.  Lleve con usted una colacin de 15gramos de hidratos de carbono en todo momento para controlar los niveles bajos de glucosa en la sangre (hipoglucemia). Algunos ejemplos de colaciones de 15gramos de hidratos de carbono son los siguientes:  Tabletas de glucosa, 3 o 4.  Gel de glucosa, tubo de 15 gramos.  Pasas de uva, 2 cucharadas (24 gramos).  Caramelos de goma, 6.  Galletas de [place], 8.  Gaseosa comn, 4onzas (152mlilitros).  Pastillas de goma, 9.  Reconocer la hipoglucemia. La hipoglucemia se produce cuando los niveles de glucosa en la sangre son de 70 mg/dl o menos. El riesgo de hipoglucemia aumenta durante el ayuno o cuando se saltea las comidas, durante o despus de rOptometristejercicio intenso y mCornvilleduerme. Los sntomas de hipoglucemia son:  Temblores o sacudidas.  Disminucin de la capacidad de concentracin.  Sudoracin.  Aumento de la frecuencia cardaca.  Dolor de cNetherlands  Sequedad en la  boca.  Hambre.  Irritabilidad.  Ansiedad.  Sueo agitado.  Alteracin del habla o de la coordinacin.  Confusin.  Tratar la hipoglucemia rpidamente. Si usted est alerta y puede tragar con seguridad, siga la regla de 15/15 que consiste en:  TMerck & Co15 y 20gramos de glucosa de accin rpida o carbohidratos. Las opciones de accin rpida son un gel de glucosa, tabletas de glucosa, o 4 onzas (120 ml) de jugo de frutas, gaseosa comn, o leche baja en grasa.  Compruebe su nivel de glucosa en la sangre 15 minutos despus de tomar la glucosa.  Tome entre 15 y 264gramos ms de glucosa si el nivel de glucosa en la sangre todava es de 753mdl o inferior.  Ingiera una comida o una colacin en el lapso de 1 hora una vez que los niveles de glucosa en la sangre vuelven a la normalidad.  Est atento a si siente mucha sed u orina con mayor frecuencia, porque son signos tempranos de hiperglucemia. El reconocimiento temprano de la hiperglucemia permite un tratamiento oportuno. Trate la hiperglucemia segn le indic su mdico.  Haga, al menos, 15060mtos de actividad fsica moderada a la semana, distribuidos en, por lo menos, 3das a la semana o como lo indique su mdico. Adems, debe realizar ejercicios de resistencia por lo menos 2veces a la semEntergy Corporation  o como lo indique su mdico. Trate de no permanecer inactivo durante ms de 65mnutos seguidos.  Ajuste su medicamento y la ingesta de alimentos, segn sea necesario, si inicia un nuevo ejercicio o deporte.  Siga su plan para los das de enfermedad cuando no puede comer o beber como de [place]  No consuma ningn producto que contenga tabaco, como cigarrillos, tabaco de mHigher education careers advisero cPsychologist, sport and exercise Si necesita ayuda para dejar de fumar, hable con el mdico.  Limite el consumo de alcohol a no ms de 1 medida por da en las mujeres no embarazadas y 2 medidas en los hombres. Debe beber alcohol solo mientras come. Hable con su mdico  acerca de si el alcohol es seguro para usted. Informe a su mdico si bebe alcohol varias veces a la semana.  Concurra a todas las visitas de control como se lo haya indicado el mdico. Esto es importante.  Programe un examen de la vista poco despus del diagnstico de diabetes tipo 2 y luego anualmente.  Realice diariamente el cuidado de la piel y de los pies. Examine su piel y los pies diariamente para ver si tiene cortes, moretones, enrojecimiento, problemas en las uas, sangrado, ampollas o lPension scheme manager Su mdico debe hacerle un examen de los pies una vez por ao.  Cepllese los dientes y encas por lo menos dos veces al da y use hilo dental al menos una vez por da. Concurra regularmente a las visitas de control con el dentista.  Comparta su plan de control de diabetes en el trabajo o en la escuela.  MSalvisa Se recomienda que las pThe First Americande 65aos con diabetes se apliquen la vacuna contra la neumona. En algunos casos, pueden administrarse dos inyecciones separadas. Pregntele al mdico si tiene la vacuna contra la neumona al da.  Aprenda a mEngineer, maintenance (IT)  Obtenga la mayor cantidad posible de informacin sobre la diabetes y solicite ayuda siempre que sea necesario.  Busque programas de rehabilitacin y participe en ellos para mantener o mejorar su independencia y su calidad de vida. Solicite la derivacin a fisioterapia o terapia ocupacional si se le aCarMaxo la mano, o tiene problemas para asearse, vestirse, comer, o durante la aKeomah Villagefsica. SOLICITE ATENCIN MDICA SI:   No puede comer alimentos o beber por ms de 6 horas.  Tuvo nuseas o ha vomitado durante ms de 6 horas.  Su nivel de glucosa en la sangre es mayor de 240 mg/dl.  Presenta algn cambio en el estado mental.  Desarrolla una enfermedad grave adicional.  Tuvo diarrea durante ms de 6 horas.  Ha estado enfermo o ha tenido fiebre durante un par de das y no  mejora.  Siente dolor al practicar cualquier actividad fsica. SOLICITE ATENCIN MDICA DE INMEDIATO SI:  Tiene dificultad para respirar.  Tiene niveles de cetonas moderados a altos. ASEGRESE DE QUE:  Comprende estas instrucciones.  Controlar su afeccin.  Recibir ayuda de inmediato si no mejora o si empeora. Document Released: 02/04/2005 Document Revised: 06/21/2013 EHampstead HospitalPatient Information 2015 EGl[place] LMaine This information is not intended to replace advice given to you by your health care provider. Make sure you discuss any questions you have with your health care provider. MSpencer(Health Maintenance) Adoptar un estilo de vida saludable y recibir atencin preventiva pueden ser de suma utilidad para promover la salud y eMusician Hable con el mdico para saber cul es el esquema de exmenes peridicos adecuado para usted. Esta  es una buena oportunidad para Teacher, adult education peridicamente al mdico sobre cmo prevenir enfermedades y [place] sano. Entre cada control mdico, hay muchas cosas que puede hacer por s solo. Los expertos han investigado mucho acerca de los cambios en el estilo de vida y las medidas preventivas que muy probablemente preserven su salud. Consulte al mdico para obtener ms informacin. EL PESO Y LA DIETA  Consuma una dieta saludable.  Incluya abundante cantidad de verduras, frutas, productos lcteos descremados y protenas magras.  No coma muchos alimentos con alto contenido de grasas slidas, azcares agregados o sal.  Realice actividad fsica con regularidad. Esta es una de las cosas ms importantes que puede hacer por su salud.  La [employer] Farm de las personas adultas deben hacer actividad fsica durante por lo menos 134mnutos semanales. El ejercicio debe aumentar la frecuencia cardaca y hNature conservation officersudar (ejercicio de intensidad moderada).  Adems, casi todos los adultos deben hacer ejercicios de fortalecimiento al mToysRusveces  por semana como complemento del ejercicio de i[place] Mantenga un peso saludable.  El ndice de masa corporal ([facility] es una medida que puede usarse para identificar posibles problemas relacionados con el peso. Este ofrece un clculo estimativo de la gAir traffic controlleren funcin del peso y lAgricultural consultant El mdico puede determinar su IWestside Medical Center Incy ayudarlo a aScience writery mTheatre managerun peso saludable.  Para las mujeres mayores de 20aos:  Un IGeorgia Bone And Joint Surgeonsmenor de 18,5 se considera bajo peso.  Un IKel[facility] Seybold Clinic Asc Springentre 18,5 y 24,9 es normal.  Un IAdvanced Medical Imaging Surgery Centerentre 25 y 29,9 es sobrepeso.  Un IMC de 30 o ms se considera obesidad. Controlar los niveles de colesterol y lpidos en la sangre  Debe comenzar a hacerse anlisis de sangre para controlar los nCoronade lpidos y colesterol a partir de l[place] y repetir estos estudios cada 5aos.  Tal vez deba someterse a controles de los niveles de colesterol con ms frecuencia si:  Tiene los niveles de lpidos o colesterol elevados.  Es mayor de 50aos.  Tiene un riesgo alto de tener enfermedades cardacas. DETECCIN DE CNCER  Cncer de pulmn  Se recomienda realizar exmenes de deteccin de cncer de pulmn a las personas adultas que tienen entre 587y 80aos, y corren riesgo de tBest boycncer de pulmn debido a sus antecedentes de tabaquismo.  Se recomienda realizar una tomografa computarizada anual de baja dosis de los pulmones a las personas que:  Siguen fumando.  Hayan dejado de fumar en los ltimos 15aos.  Hayan fumado un paquete diario durante 30aos. El ndice ao-paquete equivale a fumar, en promedio, un paquete de cigarrillos diario durante 1ao.  Debe seguir realizndose estudios de deteccin anual hasta que hayan pasado 15aos desde que dej de fumar.  Estos estudios deben suspenderse si tiene un problema de salud que le impedira recibir tratamiento para eScience writerde pulmn. Cncer de mama  Ponga en prctica la "autoconciencia de las mamas". Esto  significa reconocer la apariencia normal de las mamas y cC[place]  Adems implica realizarse autoexmenes peridicos de lJohnson & Johnson Informe al mdico si hay algn cambio, sin importar cun pequeo sea.  Si tiene entre 20 y 30aos, un mdico debe hacerle un examen clnico de las mamas cada 1 a 3aos como parte del examen habitual de salud.  Si es mayor de 40aos, debe hElectrical engineerun examen clnico de las mMicrosoft Tambin debe considerar la posibilidad de realizarse una radiografa de las mamas (mBr[place] todos los aC[place]  Si tiene antecedentes familiares de cncer  de mama, hable con el mdico para saber si debe someterse a un estudio gentico.  Si tiene un riesgo alto de Animal nutritionist de mama, hable con el mdico para saber si debe hacerse una resonancia magntica y [employer].  Se recomienda una evaluacin del gen del cncer de mama (BRCA) a las mujeres que tengan familiares con tumores malignos relacionados con el BRCA. Los tumores malignos relacionados con elBRCA incluyen:  [employer].  Los [employer].  Los tumores malignos del peritoneo.  Los resultados de la evaluacin determinarn la necesidad de asesoramiento gentico y de [place] de BRCA1 y BRCA2. Cncer de cuello uterino Ya no se recomiendan los exmenes plvicos de rutina para la deteccin del cncer de cuello uterino en las mujeres que no estn embarazadas que son consideradas sujetos de bajo riesgo de Best boy cncer de los rganos de la pelvis (ovarios, tero y vagina) y que no tienen sntomas. Tal vez sea necesario realizar un examen plvico si tiene sntomas, incluidos aquellos que estn asociados con infecciones en la pelvis. Pregntele al mdico si un examen plvico de deteccin es adecuado para usted.   El Papanicolau es la prueba de deteccin del cncer de cuello uterino para las mujeres que podran Engineer, production.  Si le han realizado una histerectoma por un problema que no  era cncer u otra enfermedad que podra causar cncer, ya no necesitar realizarse pruebas de Papanicolaou.  Si es mayor de 87aos y los resultados de las pruebas de Papanicolaou han sido normales durante los ltimos 10aos, ya no es necesario que se realice estos estudios.  Si ha recibido un tratamiento para el cncer de cuello uterino o para una enfermedad que podra causar cncer, necesitar realizarse una prueba de Papanicolaou y controles durante al menos 13 aos de concluido el [place].  Si ya no se realiza pruebas de Papanicolaou, debe evaluar sus factores de riesgo si estos se modifican (por ejemplo, tiene un nuevo compaero sexual). Esta situacin puede influir en la necesidad de que se someta nuevamente a estudios de deteccin.  Algunas mujeres sufren problemas mdicos que aumentan la probabilidad de tener cncer de cuello uterino. En este caso, el mdico podr indicarle que se someta a exmenes de deteccin y pruebas de Papanicolaou con ms frecuencia.  La prueba del virus del Engineer, technical sales (VPH) es un estudio adicional que puede usarse para la deteccin del cncer de cuello uterino. Esta prueba busca la presencia del virus que puede causar cambios celulares en el cuello del tero. Las clulas que se recolectan durante la prueba de Papanicolaou pueden usarse para el VPH.  La prueba del VPH puede usarse para examinar a las C[employer] Corporation de [id number]. Someterse a International aid/development worker del VPH puede prolongar el [employer] las pruebas de Papanicolaou normales de tres a Product manager.  Adems, se debe realizar la prueba del VPH para evaluar a las mujeres de cualquier edad cuyos resultados del Papanicolau no sean claros.  Despus de los 30aos, las mujeres deben realizarse pruebas del VPH con la misma frecuencia que las pruebas de Papanicolau. Cncer colorrectal  Es posible detectar este tipo de cncer y, a menudo, es posible prevenirlo.  Generalmente, los estudios de deteccin  de rutina del cncer colorrectal empiezan a hacerse a Proofreader de los 8aos y [employer] [phone].  El mdico puede recomendar que se los haga antes, si tiene factores de riesgo de cncer de colon.  Adems, el mdico puede recomendar que use  un kit de prueba casera para hallar sangre oculta en la materia fecal.  Es posible que se use una pequea cmara en el extremo de un tubo para examinar directamente el colon (sigmoidoscopa o colonoscopa), con el fin de detectar las formas ms incipientes de cncer colorrectal.  Generalmente, los estudios de deteccin de rutina se realizan a partir de los 50aos.  El examen directo del colon debe repetirse cada 5 a 10aos hasta cumplir 75aos. Sin embargo, tal vez deba someterse a la prueba de deteccin con ms frecuencia si se encuentran formas incipientes de plipos precancerosos o pequeos tumores. Cncer de piel  Revsese la piel peridicamente desde los dedos de los pies hasta la cabeza.  Informe al mdico si aparecen nuevos lunares o si nota cambios en los que ya tiene, especialmente en la forma o el color.  Tambin notifquele si tiene un lunar cuyo tamao es ms grande que el de la goma de un lpiz.  Use siempre pantalla solar. Aplique pantalla solar tantas veces como pueda a lo largo del da.  Protjase usando mangas y pantalones largos, un sombrero de ala ancha y gafas para el sol todo el ao, siempre que est al aire libre. ENFERMEDADES CARDACAS, DIABETES E HIPERTENSIN ARTERIAL   Debe controlarse la presin arterial al menos cada 1 o 2aos. La hipertensin arterial causa enfermedades cardacas y aumenta el riesgo de ictus.  Si tiene entre 55 y 79aos, consulte al mdico si debe tomar aspirina para prevenir ictus.  Hgase anlisis peridicos para la diabetes, que incluyen la toma de una muestra de sangre para controlar el nivel de azcar en la sangre mientras est en ayunas.  Si su peso es normal y tiene un riesgo bajo de  tener diabetes, hgase este anlisis una vez cada tres aos, despus de los 45aos.  Si tiene sobrepeso y un riesgo alto de sufrir diabetes, considere la posibilidad de hacerse los anlisis a una edad ms temprana o con ms frecuencia. PREVENCIN DE INFECCIONES  Hepatitis B  Si tiene un riesgo ms alto de tener hepatitisB, debe hacerse anlisis de deteccin de este virus. Se considera que tiene un alto riesgo de hepatitis B si:  Naci en un pas donde la hepatitisB es frecuente. Pregntele al mdico qu pases son considerados de alto riesgo.  Sus padres nacieron en un pas de alto riesgo, y usted no recibi la vacuna contra la hepatitis B.  Tiene VIH o sida.  Usa agujas para inyectarse drogas.  Convive con una persona que tiene hepatitisB.  Tuvo relaciones sexuales con una persona que tiene hepatitisB.  Recibe tratamiento de hemodilisis.  Toma ciertos medicamentos para cuadros clnicos tales como cncer, trasplante de   

## 2014-05-09 NOTE — Addendum Note (Signed)
Addended by: Prescott GumLAND, Kawika Bischoff M on: 05/09/2014 09:56 AM   Modules accepted: Kipp BroodSmartSet

## 2014-05-09 NOTE — Progress Notes (Signed)
Subjective:    Patient ID: Gabriela Watkins, female    DOB: 04-08-1965, 49 y.o.   MRN: [id number]  Diabetes She presents for her follow-up diabetic visit. She has type 2 diabetes mellitus. Her disease course has been stable. There are no hypoglycemic associated symptoms. Pertinent negatives for hypoglycemia include no dizziness or headaches. Pertinent negatives for diabetes include no chest pain, no foot paresthesias, no foot ulcerations and no visual change. There are no hypoglycemic complications. Pertinent negatives for diabetic complications include no CVA, heart disease or peripheral neuropathy. Risk factors for coronary artery disease include diabetes mellitus, dyslipidemia, hypertension and sedentary lifestyle. Current diabetic treatment includes oral agent (monotherapy). She is compliant with treatment all of the time. She is following a generally unhealthy diet. She rarely participates in exercise. (Pt does not take Blood sugars) An ACE inhibitor/angiotensin II receptor blocker is being taken. Eye exam is not current (Pt educated on importance of yearly eye exams).  Hypertension This is a chronic problem. The current episode started more than 1 year ago. The problem has been waxing and waning since onset. The problem is uncontrolled. Associated symptoms include peripheral edema. Pertinent negatives include no anxiety, chest pain, headaches, palpitations or shortness of breath. Risk factors for coronary artery disease include diabetes mellitus, dyslipidemia, obesity and sedentary lifestyle. Past treatments include ACE inhibitors and diuretics. The current treatment provides significant improvement. Compliance problems include exercise.  There is no history of kidney disease, CAD/MI, CVA, heart failure or a thyroid problem. There is no history of sleep apnea.  Hyperlipidemia This is a chronic problem. The current episode started more than 1 year ago. The problem is uncontrolled. Recent lipid tests were  reviewed and are high. Exacerbating diseases include diabetes. She has no history of hypothyroidism. Factors aggravating her hyperlipidemia include fatty foods. Pertinent negatives include no chest pain, leg pain, myalgias or shortness of breath. Current antihyperlipidemic treatment includes statins. The current treatment provides moderate improvement of lipids. Risk factors for coronary artery disease include diabetes mellitus, dyslipidemia, hypertension, obesity, post-menopausal and a sedentary lifestyle.   *Pt states she has been out of her medications for the last two weeks.    Review of Systems  Constitutional: Negative.   HENT: Negative.   Eyes: Negative.   Respiratory: Negative.  Negative for shortness of breath.   Cardiovascular: Negative.  Negative for chest pain and palpitations.  Gastrointestinal: Negative.   Endocrine: Negative.   Genitourinary: Negative.   Musculoskeletal: Negative.  Negative for myalgias.  Neurological: Negative.  Negative for dizziness and headaches.  Hematological: Negative.   Psychiatric/Behavioral: Negative.   All other systems reviewed and are negative.      Objective:   Physical Exam  Constitutional: She is oriented to person, place, and time. She appears well-developed and well-nourished. No distress.  HENT:  Head: Normocephalic and atraumatic.  Right Ear: External ear normal.  Left Ear: External ear normal.  Nose: Nose normal.  Mouth/Throat: Oropharynx is clear and moist.  Eyes: Pupils are equal, round, and reactive to light.  Neck: Normal range of motion. Neck supple. No thyromegaly present.  Cardiovascular: Normal rate, regular rhythm, normal heart sounds and intact distal pulses.   No murmur heard. Pulmonary/Chest: Effort normal and breath sounds normal. No respiratory distress. She has no wheezes.  Abdominal: Soft. Bowel sounds are normal. She exhibits no distension. There is no tenderness.  Musculoskeletal: Normal range of motion. She  exhibits no edema or tenderness.  Neurological: She is alert and oriented to person, place,  and time. She has normal reflexes. No cranial nerve deficit.  Skin: Skin is warm and dry.  Psychiatric: She has a normal mood and affect. Her behavior is normal. Judgment and thought content normal.  Vitals reviewed.   BP 147/78 mmHg  Pulse 79  Temp(Src) 98.4 F (36.9 C) (Oral)  Ht 5' 2"  (1.575 m)  Wt 275 lb (124.739 kg)  BMI 50.29 kg/m2  See diabetic foot note     Assessment & Plan:  1. Essential hypertension, benign - CMP14+EGFR - lisinopril-hydrochlorothiazide (PRINZIDE,ZESTORETIC) 10-12.5 MG per tablet; Take 1 tablet by mouth daily.  Dispense: 90 tablet; Refill: 3  2. Type 2 diabetes mellitus with hyperglycemia - POCT glycosylated hemoglobin (Hb A1C) - POCT UA - Microalbumin - CMP14+EGFR - metFORMIN (GLUCOPHAGE-XR) 500 MG 24 hr tablet; Take 1 tablet (500 mg total) by mouth daily with breakfast.  Dispense: 90 tablet; Refill: 4  3. Obesity, morbid - CMP14+EGFR - Lipid panel  4. Hyperlipidemia - CMP14+EGFR - Lipid panel - lovastatin (MEVACOR) 40 MG tablet; Take 1 tablet (40 mg total) by mouth at bedtime.  Dispense: 90 tablet; Refill: 3  5. Encounter for vitamin deficiency screening - Vit D  25 hydroxy (rtn osteoporosis monitoring)   Continue all meds Labs pending Health Maintenance reviewed- Pt to schedule mammogram and diabetic eye exam Diet and exercise encouraged RTO 3 months  Evelina Dun, FNP

## 2014-05-09 NOTE — Addendum Note (Signed)
Addended by: Prescott GumLAND, Elior Robinette M on: 05/09/2014 10:39 AM   Modules accepted: Orders

## 2014-05-10 ENCOUNTER — Other Ambulatory Visit: Payer: Self-pay | Admitting: Family

## 2014-05-10 LAB — CMP14+EGFR
A/G RATIO: 1.5 (ref 1.1–2.5)
ALT: 20 IU/L (ref 0–32)
AST: 16 IU/L (ref 0–40)
Albumin: 4.1 g/dL (ref 3.5–5.5)
Alkaline Phosphatase: 123 IU/L — ABNORMAL HIGH (ref 39–117)
BILIRUBIN TOTAL: 0.2 mg/dL (ref 0.0–1.2)
BUN / CREAT RATIO: 31 — AB (ref 9–23)
BUN: 14 mg/dL (ref 6–24)
CO2: 21 mmol/L (ref 18–29)
CREATININE: 0.45 mg/dL — AB (ref 0.57–1.00)
Calcium: 8.6 mg/dL — ABNORMAL LOW (ref 8.7–10.2)
Chloride: 103 mmol/L (ref 97–108)
GFR, EST AFRICAN AMERICAN: 139 mL/min/{1.73_m2} (ref >59)
GFR, EST NON AFRICAN AMERICAN: 121 mL/min/{1.73_m2} (ref >59)
GLOBULIN, TOTAL: 2.7 g/dL (ref 1.5–4.5)
Glucose: 123 mg/dL — ABNORMAL HIGH (ref 65–99)
POTASSIUM: 4.7 mmol/L (ref 3.5–5.2)
SODIUM: 139 mmol/L (ref 134–144)
Total Protein: 6.8 g/dL (ref 6.0–8.5)

## 2014-05-10 LAB — LIPID PANEL
CHOLESTEROL TOTAL: 204 mg/dL — AB (ref 100–199)
Chol/HDL Ratio: 5.4 ratio units — ABNORMAL HIGH (ref 0.0–4.4)
HDL: 38 mg/dL — ABNORMAL LOW (ref >39)
LDL Calculated: 131 mg/dL — ABNORMAL HIGH (ref 0–99)
Triglycerides: 176 mg/dL — ABNORMAL HIGH (ref 0–149)
VLDL CHOLESTEROL CAL: 35 mg/dL (ref 5–40)

## 2014-05-10 LAB — MICROALBUMIN, URINE: MICROALBUM., U, RANDOM: 11.4 ug/mL (ref 0.0–17.0)

## 2014-05-10 LAB — VITAMIN D 25 HYDROXY (VIT D DEFICIENCY, FRACTURES): Vit D, 25-Hydroxy: 10.3 ng/mL — ABNORMAL LOW (ref 30.0–100.0)

## 2014-05-10 MED ORDER — LISINOPRIL-HYDROCHLOROTHIAZIDE 20-12.5 MG PO TABS: 1.0000 | ORAL_TABLET | Freq: Every day | ORAL | Status: DC

## 2014-05-10 MED ORDER — ATORVASTATIN CALCIUM 40 MG PO TABS: 40.0000 mg | ORAL_TABLET | Freq: Every day | ORAL | Status: DC

## 2014-05-10 MED ORDER — VITAMIN D (ERGOCALCIFEROL) 1.25 MG (50000 UNIT) PO CAPS: 50000.0000 [IU] | ORAL_CAPSULE | ORAL | Status: DC

## 2014-05-12 ENCOUNTER — Telehealth: Payer: Self-pay | Admitting: *Deleted

## 2014-05-12 NOTE — Telephone Encounter (Signed)
Aware of results and change in medications and new scripts.

## 2014-05-12 NOTE — Telephone Encounter (Signed)
-----   Message from Junie Spencerhristy A Hawks, FNP sent at 05/10/2014  3:51 PM EDT ----- Microalbumin in urine- Pt's zestoretic increased to 20-12.5 mg from 10-12.5- Pt needs to have good control of blood glucose HgbA1C WNL Kidney and liver function stable Cholesterol and triglycerides elevated- Pt to stop Lovostatin and start Lipitor 40 mg- Prescription sent to pharmacy  Vit D levels low-Prescription sent to pharmacy

## 2014-07-25 ENCOUNTER — Encounter: Payer: Self-pay | Admitting: Family

## 2014-08-15 ENCOUNTER — Ambulatory Visit (INDEPENDENT_AMBULATORY_CARE_PROVIDER_SITE_OTHER): Payer: 59 | Admitting: Family

## 2014-08-15 ENCOUNTER — Encounter: Payer: Self-pay | Admitting: Family

## 2014-08-15 ENCOUNTER — Encounter (INDEPENDENT_AMBULATORY_CARE_PROVIDER_SITE_OTHER): Payer: Self-pay

## 2014-08-15 VITALS — BP 132/86 | HR 69 | Temp 97.4°F | Ht 62.0 in | Wt 277.8 lb

## 2014-08-15 DIAGNOSIS — E785 Hyperlipidemia, unspecified: Secondary | ICD-10-CM

## 2014-08-15 DIAGNOSIS — Z0100 Encounter for examination of eyes and vision without abnormal findings: Secondary | ICD-10-CM

## 2014-08-15 DIAGNOSIS — E1165 Type 2 diabetes mellitus with hyperglycemia: Secondary | ICD-10-CM

## 2014-08-15 DIAGNOSIS — E119 Type 2 diabetes mellitus without complications: Secondary | ICD-10-CM | POA: Diagnosis not present

## 2014-08-15 DIAGNOSIS — E559 Vitamin D deficiency, unspecified: Secondary | ICD-10-CM

## 2014-08-15 DIAGNOSIS — I10 Essential (primary) hypertension: Secondary | ICD-10-CM | POA: Diagnosis not present

## 2014-08-15 LAB — POCT GLYCOSYLATED HEMOGLOBIN (HGB A1C): HEMOGLOBIN A1C: 6.4

## 2014-08-15 MED ORDER — METFORMIN HCL ER 500 MG PO TB24: 500.0000 mg | ORAL_TABLET | Freq: Every day | ORAL | Status: DC

## 2014-08-15 MED ORDER — ATORVASTATIN CALCIUM 40 MG PO TABS: 40.0000 mg | ORAL_TABLET | Freq: Every day | ORAL | Status: DC

## 2014-08-15 MED ORDER — LISINOPRIL-HYDROCHLOROTHIAZIDE 20-12.5 MG PO TABS: 1.0000 | ORAL_TABLET | Freq: Every day | ORAL | Status: DC

## 2014-08-15 NOTE — Patient Instructions (Signed)
Clarinda (Health Maintenance) Adoptar un estilo de vida saludable y recibir atencin preventiva pueden ser de suma utilidad para promover la salud y Musician. Hable con el mdico para saber cul es el esquema de exmenes peridicos adecuado para usted. Esta es una buena oportunidad para Teacher, adult education peridicamente al mdico sobre cmo prevenir enfermedades y [place] sano. Entre cada control mdico, hay muchas cosas que puede hacer por s solo. Los expertos han investigado mucho acerca de los cambios en el estilo de vida y las medidas preventivas que muy probablemente preserven su salud. Consulte al mdico para obtener ms informacin. EL PESO Y LA DIETA  Consuma una dieta saludable.  Incluya abundante cantidad de verduras, frutas, productos lcteos descremados y protenas magras.  No coma muchos alimentos con alto contenido de grasas slidas, azcares agregados o sal.  Realice actividad fsica con regularidad. Esta es una de las cosas ms importantes que puede hacer por su salud.  La [employer] de las personas adultas deben hacer actividad fsica durante por lo menos 140mnutos semanales. El ejercicio debe aumentar la frecuencia cardaca y hNature conservation officersudar (ejercicio de intensidad moderada).  Adems, casi todos los adultos deben hacer ejercicios de fortalecimiento al mToysRusveces por semana como complemento del ejercicio de iDante Mantenga un peso saludable.  El ndice de masa corporal ([facility] es una medida que puede usarse para identificar posibles problemas relacionados con el peso. Este ofrece un clculo estimativo de la gAir traffic controlleren funcin del peso y lAgricultural consultant El mdico puede determinar su IEncino Outpatient Surgery Center LLCy ayudarlo a aScience writery mTheatre managerun peso saludable.  Para las mujeres mayores de 20aos:  Un ISurgicare Surgical Associates Of Mahwah LLCmenor de 18,5 se considera bajo peso.  Un IBaptist Health Medical Center - North Little Rockentre 18,5 y 24,9 es normal.  Un ITerrell State Hospitalentre 25 y 29,9 es sobrepeso.  Un IMC de 30 o ms se considera  obesidad. Controlar los niveles de colesterol y lpidos en la sangre  Debe comenzar a hacerse anlisis de sangre para controlar los nAscutneyde lpidos y colesterol a partir de lSed[place] y repetir estos estudios cada 5aos.  Tal vez deba someterse a controles de los niveles de colesterol con ms frecuencia si:  Tiene los niveles de lpidos o colesterol elevados.  Es mayor de 50aos.  Tiene un riesgo alto de tener enfermedades cardacas. DETECCIN DE CNCER  Cncer de pulmn  Se recomienda realizar exmenes de deteccin de cncer de pulmn a las personas adultas que tienen entre 581y 80aos, y corren riesgo de tBest boycncer de pulmn debido a sus antecedentes de tabaquismo.  Se recomienda realizar una tomografa computarizada anual de baja dosis de los pulmones a las personas que:  Siguen fumando.  Hayan dejado de fumar en los ltimos 15aos.  Hayan fumado un paquete diario durante 30aos. El ndice ao-paquete equivale a fumar, en promedio, un paquete de cigarrillos diario durante 1ao.  Debe seguir realizndose estudios de deteccin anual hasta que hayan pasado 15aos desde que dej de fumar.  Estos estudios deben suspenderse si tiene un problema de salud que le impedira recibir tratamiento para eScience writerde pulmn. Cncer de mama  Ponga en prctica la "autoconciencia de las mamas". Esto significa reconocer la apariencia normal de las mamas y cSt. [place]  Adems implica realizarse autoexmenes peridicos de lJohnson & Johnson Informe al mdico si hay algn cambio, sin importar cun pequeo sea.  Si tiene entre 20 y 30aos, un mdico debe hacerle un examen clnico de las mamas cada 1 a 316aoscomo parte del  examen habitual de salud.  Si es mayor de 40aos, debe Electrical engineer un examen clnico de las Microsoft. Tambin debe considerar la posibilidad de realizarse una radiografa de las mamas ([place]) todos los [place].  Si tiene antecedentes familiares de cncer de  mama, hable con el mdico para saber si debe someterse a un estudio gentico.  Si tiene un riesgo alto de Animal nutritionist de mama, hable con el mdico para saber si debe hacerse una resonancia magntica y [employer].  Se recomienda una evaluacin del gen del cncer de mama (BRCA) a las mujeres que tengan familiares con tumores malignos relacionados con el BRCA. Los tumores malignos relacionados con elBRCA incluyen:  [employer].  Los [employer].  Los tumores malignos del peritoneo.  Los resultados de la evaluacin determinarn la necesidad de asesoramiento gentico y de [place] de BRCA1 y BRCA2. Cncer de cuello uterino Ya no se recomiendan los exmenes plvicos de rutina para la deteccin del cncer de cuello uterino en las mujeres que no estn embarazadas que son consideradas sujetos de bajo riesgo de Best boy cncer de los rganos de la pelvis (ovarios, tero y vagina) y que no tienen sntomas. Tal vez sea necesario realizar un examen plvico si tiene sntomas, incluidos aquellos que estn asociados con infecciones en la pelvis. Pregntele al mdico si un examen plvico de deteccin es adecuado para usted.   El Papanicolau es la prueba de deteccin del cncer de cuello uterino para las mujeres que podran Engineer, production.  Si le han realizado una histerectoma por un problema que no era cncer u otra enfermedad que podra causar cncer, ya no necesitar realizarse pruebas de Papanicolaou.  Si es mayor de 39aos y los resultados de las pruebas de Papanicolaou han sido normales durante los ltimos 10aos, ya no es necesario que se realice estos estudios.  Si ha recibido un tratamiento para el cncer de cuello uterino o para una enfermedad que podra causar cncer, necesitar realizarse una prueba de Papanicolaou y controles durante al menos 26 aos de concluido el [place].  Si ya no se realiza pruebas de Papanicolaou, debe evaluar sus factores de riesgo si estos  se modifican (por ejemplo, tiene un nuevo compaero sexual). Esta situacin puede influir en la necesidad de que se someta nuevamente a estudios de deteccin.  Algunas mujeres sufren problemas mdicos que aumentan la probabilidad de tener cncer de cuello uterino. En este caso, el mdico podr indicarle que se someta a exmenes de deteccin y pruebas de Papanicolaou con ms frecuencia.  La prueba del virus del Engineer, technical sales (VPH) es un estudio adicional que puede usarse para la deteccin del cncer de cuello uterino. Esta prueba busca la presencia del virus que puede causar cambios celulares en el cuello del tero. Las clulas que se recolectan durante la prueba de Papanicolaou pueden usarse para el VPH.  La prueba del VPH puede usarse para examinar a las [employer] de [id number]. Someterse a International aid/development worker del VPH puede prolongar el [employer] las pruebas de Papanicolaou normales de tres a Product manager.  Adems, se debe realizar la prueba del VPH para evaluar a las mujeres de cualquier edad cuyos resultados del Papanicolau no sean claros.  Despus de los 30aos, las mujeres deben realizarse pruebas del VPH con la misma frecuencia que las pruebas de Papanicolau. Cncer colorrectal  Es posible detectar este tipo de cncer y, a menudo, es posible prevenirlo.  Generalmente, los estudios de deteccin de  rutina del cncer colorrectal empiezan a hacerse a partir de los 50aos y continan hasta los 75aos.  El mdico puede recomendar que se los haga antes, si tiene factores de riesgo de cncer de colon.  Adems, el mdico puede recomendar que use un kit de prueba casera para hallar sangre oculta en la materia fecal.  Es posible que se use una pequea cmara en el extremo de un tubo para examinar directamente el colon (sigmoidoscopa o colonoscopa), con el fin de detectar las formas ms incipientes de cncer colorrectal.  Generalmente, los estudios de deteccin de rutina se realizan  a partir de los 50aos.  El examen directo del colon debe repetirse cada 5 a 10aos hasta cumplir 75aos. Sin embargo, tal vez deba someterse a la prueba de deteccin con ms frecuencia si se encuentran formas incipientes de plipos precancerosos o pequeos tumores. Cncer de piel  Revsese la piel peridicamente desde los dedos de los pies hasta la cabeza.  Informe al mdico si aparecen nuevos lunares o si nota cambios en los que ya tiene, especialmente en la forma o el color.  Tambin notifquele si tiene un lunar cuyo tamao es ms grande que el de la goma de un lpiz.  Use siempre pantalla solar. Aplique pantalla solar tantas veces como pueda a lo largo del da.  Protjase usando mangas y pantalones largos, un sombrero de ala ancha y gafas para el sol todo el ao, siempre que est al aire libre. ENFERMEDADES CARDACAS, DIABETES E HIPERTENSIN ARTERIAL   Debe controlarse la presin arterial al menos cada 1 o 2aos. La hipertensin arterial causa enfermedades cardacas y aumenta el riesgo de ictus.  Si tiene entre 55 y 79aos, consulte al mdico si debe tomar aspirina para prevenir ictus.  Hgase anlisis peridicos para la diabetes, que incluyen la toma de una muestra de sangre para controlar el nivel de azcar en la sangre mientras est en ayunas.  Si su peso es normal y tiene un riesgo bajo de tener diabetes, hgase este anlisis una vez cada tres aos, despus de los 45aos.  Si tiene sobrepeso y un riesgo alto de sufrir diabetes, considere la posibilidad de hacerse los anlisis a una edad ms temprana o con ms frecuencia. PREVENCIN DE INFECCIONES  Hepatitis B  Si tiene un riesgo ms alto de tener hepatitisB, debe hacerse anlisis de deteccin de este virus. Se considera que tiene un alto riesgo de hepatitis B si:  Naci en un pas donde la hepatitisB es frecuente. Pregntele al mdico qu pases son considerados de alto riesgo.  Sus padres nacieron en un pas de alto  riesgo, y usted no recibi la vacuna contra la hepatitis B.  Tiene VIH o sida.  Usa agujas para inyectarse drogas.  Convive con una persona que tiene hepatitisB.  Tuvo relaciones sexuales con una persona que tiene hepatitisB.  Recibe tratamiento de hemodilisis.  Toma ciertos medicamentos para cuadros clnicos tales como cncer, trasplante de  

## 2014-08-15 NOTE — Progress Notes (Signed)
Subjective:    Patient ID: Gabriela Watkins, female    DOB: 12/27/1965, 49 y.o.   MRN: [id number]  Pt presents to the office today for chronic follow up.  Diabetes She presents for her follow-up diabetic visit. She has type 2 diabetes mellitus. Her disease course has been stable. There are no hypoglycemic associated symptoms. Pertinent negatives for hypoglycemia include no dizziness or headaches. Pertinent negatives for diabetes include no chest pain, no foot paresthesias, no foot ulcerations and no visual change. There are no hypoglycemic complications. Pertinent negatives for diabetic complications include no CVA, heart disease or peripheral neuropathy. Risk factors for coronary artery disease include diabetes mellitus, dyslipidemia, hypertension and sedentary lifestyle. Current diabetic treatment includes oral agent (monotherapy). She is compliant with treatment all of the time. She is following a generally unhealthy diet. She rarely participates in exercise. (Pt does not take Blood sugars) An ACE inhibitor/angiotensin II receptor blocker is being taken. Eye exam is not current (Pt educated on importance of yearly eye exams).  Hypertension This is a chronic problem. The current episode started more than 1 year ago. The problem has been resolved since onset. The problem is controlled. Associated symptoms include peripheral edema. Pertinent negatives include no anxiety, chest pain, headaches, palpitations or shortness of breath. Risk factors for coronary artery disease include diabetes mellitus, dyslipidemia, obesity and sedentary lifestyle. Past treatments include ACE inhibitors and diuretics. The current treatment provides significant improvement. Compliance problems include exercise.  There is no history of kidney disease, CAD/MI, CVA, heart failure or a thyroid problem. There is no history of sleep apnea.  Hyperlipidemia This is a chronic problem. The current episode started more than 1 year ago. The  problem is uncontrolled. Recent lipid tests were reviewed and are high. Exacerbating diseases include diabetes. She has no history of hypothyroidism. Factors aggravating her hyperlipidemia include fatty foods. Pertinent negatives include no chest pain, leg pain, myalgias or shortness of breath. Current antihyperlipidemic treatment includes statins. The current treatment provides mild improvement of lipids. Risk factors for coronary artery disease include diabetes mellitus, dyslipidemia, hypertension, obesity, post-menopausal and a sedentary lifestyle.      Review of Systems  Constitutional: Negative.   HENT: Negative.   Eyes: Negative.   Respiratory: Negative.  Negative for shortness of breath.   Cardiovascular: Negative.  Negative for chest pain and palpitations.  Gastrointestinal: Negative.   Endocrine: Negative.   Genitourinary: Negative.   Musculoskeletal: Negative.  Negative for myalgias.  Neurological: Negative.  Negative for dizziness and headaches.  Hematological: Negative.   Psychiatric/Behavioral: Negative.   All other systems reviewed and are negative.      Objective:   Physical Exam  Constitutional: She is oriented to person, place, and time. She appears well-developed and well-nourished. No distress.  HENT:  Head: Normocephalic and atraumatic.  Right Ear: External ear normal.  Left Ear: External ear normal.  Nose: Nose normal.  Mouth/Throat: Oropharynx is clear and moist.  Eyes: Pupils are equal, round, and reactive to light.  Neck: Normal range of motion. Neck supple. No thyromegaly present.  Cardiovascular: Normal rate, regular rhythm, normal heart sounds and intact distal pulses.   No murmur heard. Pulmonary/Chest: Effort normal and breath sounds normal. No respiratory distress. She has no wheezes.  Abdominal: Soft. Bowel sounds are normal. She exhibits no distension. There is no tenderness.  Musculoskeletal: Normal range of motion. She exhibits no edema or  tenderness.  Neurological: She is alert and oriented to person, place, and time. She has normal  reflexes. No cranial nerve deficit.  Skin: Skin is warm and dry.  Psychiatric: She has a normal mood and affect. Her behavior is normal. Judgment and thought content normal.  Vitals reviewed.     BP 132/86 mmHg  Pulse 69  Temp(Src) 97.4 F (36.3 C) (Oral)  Ht $R'5\' 2"'ew$  (1.575 m)  Wt 277 lb 12.8 oz (126.009 kg)  BMI 50.80 kg/m2     Assessment & Plan:  1. Essential hypertension, benign - CMP14+EGFR - lisinopril-hydrochlorothiazide (ZESTORETIC) 20-12.5 MG per tablet; Take 1 tablet by mouth daily.  Dispense: 90 tablet; Refill: 3  2. Type 2 diabetes mellitus with hyperglycemia - POCT glycosylated hemoglobin (Hb A1C) - CMP14+EGFR - Ambulatory referral to Ophthalmology - metFORMIN (GLUCOPHAGE-XR) 500 MG 24 hr tablet; Take 1 tablet (500 mg total) by mouth daily with breakfast.  Dispense: 90 tablet; Refill: 4  3. Hyperlipidemia - CMP14+EGFR - Lipid panel - atorvastatin (LIPITOR) 40 MG tablet; Take 1 tablet (40 mg total) by mouth daily.  Dispense: 90 tablet; Refill: 3  4. Obesity, morbid -Encouraged healthy diet and exercise - CMP14+EGFR  5. Diabetic eye exam - CMP14+EGFR - Ambulatory referral to Ophthalmology  6. Vitamin D deficiency - CMP14+EGFR - Vit D  25 hydroxy (rtn osteoporosis monitoring)   Continue all meds Labs pending Health Maintenance reviewed Diet and exercise encouraged RTO 3 months   Evelina Dun, FNP

## 2014-08-16 LAB — CMP14+EGFR
ALK PHOS: 119 IU/L — AB (ref 39–117)
ALT: 21 IU/L (ref 0–32)
AST: 22 IU/L (ref 0–40)
Albumin/Globulin Ratio: 1.6 (ref 1.1–2.5)
Albumin: 4.2 g/dL (ref 3.5–5.5)
BILIRUBIN TOTAL: 0.3 mg/dL (ref 0.0–1.2)
BUN / CREAT RATIO: 18 (ref 9–23)
BUN: 10 mg/dL (ref 6–24)
CHLORIDE: 100 mmol/L (ref 97–108)
CO2: 21 mmol/L (ref 18–29)
Calcium: 8.8 mg/dL (ref 8.7–10.2)
Creatinine, Ser: 0.55 mg/dL — ABNORMAL LOW (ref 0.57–1.00)
GFR calc non Af Amer: 113 mL/min/{1.73_m2} (ref >59)
GFR, EST AFRICAN AMERICAN: 130 mL/min/{1.73_m2} (ref >59)
GLOBULIN, TOTAL: 2.7 g/dL (ref 1.5–4.5)
Glucose: 129 mg/dL — ABNORMAL HIGH (ref 65–99)
Potassium: 4.4 mmol/L (ref 3.5–5.2)
SODIUM: 138 mmol/L (ref 134–144)
Total Protein: 6.9 g/dL (ref 6.0–8.5)

## 2014-08-16 LAB — LIPID PANEL
CHOL/HDL RATIO: 4.8 ratio — AB (ref 0.0–4.4)
Cholesterol, Total: 181 mg/dL (ref 100–199)
HDL: 38 mg/dL — ABNORMAL LOW (ref >39)
LDL CALC: 113 mg/dL — AB (ref 0–99)
Triglycerides: 149 mg/dL (ref 0–149)
VLDL Cholesterol Cal: 30 mg/dL (ref 5–40)

## 2014-08-16 LAB — VITAMIN D 25 HYDROXY (VIT D DEFICIENCY, FRACTURES): Vit D, 25-Hydroxy: 21.4 ng/mL — ABNORMAL LOW (ref 30.0–100.0)

## 2014-08-17 ENCOUNTER — Other Ambulatory Visit: Payer: Self-pay | Admitting: Family

## 2014-08-17 MED ORDER — ATORVASTATIN CALCIUM 80 MG PO TABS: 80.0000 mg | ORAL_TABLET | Freq: Every day | ORAL | Status: DC

## 2014-09-08 ENCOUNTER — Encounter (INDEPENDENT_AMBULATORY_CARE_PROVIDER_SITE_OTHER): Payer: 59 | Admitting: Ophthalmology

## 2014-09-29 ENCOUNTER — Encounter (INDEPENDENT_AMBULATORY_CARE_PROVIDER_SITE_OTHER): Payer: 59 | Admitting: Ophthalmology

## 2014-09-29 DIAGNOSIS — E11329 Type 2 diabetes mellitus with mild nonproliferative diabetic retinopathy without macular edema: Secondary | ICD-10-CM

## 2014-09-29 DIAGNOSIS — H43813 Vitreous degeneration, bilateral: Secondary | ICD-10-CM

## 2014-09-29 DIAGNOSIS — H35033 Hypertensive retinopathy, bilateral: Secondary | ICD-10-CM | POA: Diagnosis not present

## 2014-09-29 DIAGNOSIS — E11319 Type 2 diabetes mellitus with unspecified diabetic retinopathy without macular edema: Secondary | ICD-10-CM | POA: Diagnosis not present

## 2014-09-29 DIAGNOSIS — I10 Essential (primary) hypertension: Secondary | ICD-10-CM

## 2014-09-29 LAB — HM DIABETES EYE EXAM

## 2014-10-07 ENCOUNTER — Encounter: Payer: Self-pay | Admitting: *Deleted

## 2014-11-28 ENCOUNTER — Encounter: Payer: Self-pay | Admitting: Family

## 2014-11-28 ENCOUNTER — Ambulatory Visit (INDEPENDENT_AMBULATORY_CARE_PROVIDER_SITE_OTHER): Payer: 59 | Admitting: Family

## 2014-11-28 VITALS — BP 136/84 | HR 73 | Temp 98.4°F | Ht 62.0 in | Wt 275.0 lb

## 2014-11-28 DIAGNOSIS — I10 Essential (primary) hypertension: Secondary | ICD-10-CM

## 2014-11-28 DIAGNOSIS — E785 Hyperlipidemia, unspecified: Secondary | ICD-10-CM | POA: Diagnosis not present

## 2014-11-28 DIAGNOSIS — E559 Vitamin D deficiency, unspecified: Secondary | ICD-10-CM

## 2014-11-28 DIAGNOSIS — E1165 Type 2 diabetes mellitus with hyperglycemia: Secondary | ICD-10-CM | POA: Diagnosis not present

## 2014-11-28 LAB — POCT GLYCOSYLATED HEMOGLOBIN (HGB A1C): HEMOGLOBIN A1C: 6.7

## 2014-11-28 MED ORDER — METFORMIN HCL ER 500 MG PO TB24: 500.0000 mg | ORAL_TABLET | Freq: Every day | ORAL | Status: DC

## 2014-11-28 MED ORDER — GLUCOSE BLOOD VI STRP: ORAL_STRIP | Status: DC

## 2014-11-28 MED ORDER — ATORVASTATIN CALCIUM 80 MG PO TABS: 80.0000 mg | ORAL_TABLET | Freq: Every day | ORAL | Status: DC

## 2014-11-28 MED ORDER — ASPIRIN EC 81 MG PO TBEC: 81.0000 mg | DELAYED_RELEASE_TABLET | Freq: Every day | ORAL | Status: DC

## 2014-11-28 MED ORDER — VITAMIN D (ERGOCALCIFEROL) 1.25 MG (50000 UNIT) PO CAPS: 50000.0000 [IU] | ORAL_CAPSULE | ORAL | Status: DC

## 2014-11-28 MED ORDER — LISINOPRIL-HYDROCHLOROTHIAZIDE 20-12.5 MG PO TABS: 1.0000 | ORAL_TABLET | Freq: Every day | ORAL | Status: DC

## 2014-11-28 NOTE — Progress Notes (Signed)
Subjective:    Patient ID: Gabriela Watkins, female    DOB: 05/24/1965, 49 y.o.   MRN: [id number]  Pt presents to the office today for chronic follow up.  Diabetes She presents for her follow-up diabetic visit. She has type 2 diabetes mellitus. Her disease course has been stable. There are no hypoglycemic associated symptoms. Pertinent negatives for hypoglycemia include no dizziness or headaches. Pertinent negatives for diabetes include no chest pain, no foot paresthesias, no foot ulcerations and no visual change. There are no hypoglycemic complications. Pertinent negatives for diabetic complications include no CVA, heart disease or peripheral neuropathy. Risk factors for coronary artery disease include diabetes mellitus, dyslipidemia, hypertension and sedentary lifestyle. Current diabetic treatment includes oral agent (monotherapy). She is compliant with treatment all of the time. She is following a generally unhealthy diet. She rarely participates in exercise. (Pt does not take Blood sugars) An ACE inhibitor/angiotensin II receptor blocker is being taken. Eye exam is current (Pt states August 2016).  Hypertension This is a chronic problem. The current episode started more than 1 year ago. The problem has been resolved since onset. The problem is controlled. Associated symptoms include peripheral edema. Pertinent negatives include no anxiety, chest pain, headaches, palpitations or shortness of breath. Risk factors for coronary artery disease include diabetes mellitus, dyslipidemia, obesity and sedentary lifestyle. Past treatments include ACE inhibitors and diuretics. The current treatment provides significant improvement. Compliance problems include exercise.  There is no history of kidney disease, CAD/MI, CVA, heart failure or a thyroid problem. There is no history of sleep apnea.  Hyperlipidemia This is a chronic problem. The current episode started more than 1 year ago. The problem is uncontrolled. Recent  lipid tests were reviewed and are high. Exacerbating diseases include diabetes. She has no history of hypothyroidism. Factors aggravating her hyperlipidemia include fatty foods. Pertinent negatives include no chest pain, leg pain, myalgias or shortness of breath. Current antihyperlipidemic treatment includes statins. The current treatment provides mild improvement of lipids. Risk factors for coronary artery disease include diabetes mellitus, dyslipidemia, hypertension, obesity, post-menopausal and a sedentary lifestyle.      Review of Systems  Constitutional: Negative.   HENT: Negative.   Eyes: Negative.   Respiratory: Negative.  Negative for shortness of breath.   Cardiovascular: Negative.  Negative for chest pain and palpitations.  Gastrointestinal: Negative.   Endocrine: Negative.   Genitourinary: Negative.   Musculoskeletal: Negative.  Negative for myalgias.  Neurological: Negative.  Negative for dizziness and headaches.  Hematological: Negative.   Psychiatric/Behavioral: Negative.   All other systems reviewed and are negative.      Objective:   Physical Exam  Constitutional: She is oriented to person, place, and time. She appears well-developed and well-nourished. No distress.  HENT:  Head: Normocephalic and atraumatic.  Right Ear: External ear normal.  Left Ear: External ear normal.  Nose: Nose normal.  Mouth/Throat: Oropharynx is clear and moist.  Eyes: Pupils are equal, round, and reactive to light.  Neck: Normal range of motion. Neck supple. No thyromegaly present.  Cardiovascular: Normal rate, regular rhythm, normal heart sounds and intact distal pulses.   No murmur heard. Pulmonary/Chest: Effort normal and breath sounds normal. No respiratory distress. She has no wheezes.  Abdominal: Soft. Bowel sounds are normal. She exhibits no distension. There is no tenderness.  Musculoskeletal: Normal range of motion. She exhibits no edema or tenderness.  Neurological: She is  alert and oriented to person, place, and time. She has normal reflexes. No cranial nerve deficit.  Skin: Skin is warm and dry.  Psychiatric: She has a normal mood and affect. Her behavior is normal. Judgment and thought content normal.  Vitals reviewed.     BP 136/84 mmHg  Pulse 73  Temp(Src) 98.4 F (36.9 C) (Oral)  Ht $R'5\' 2"'Ef$  (1.575 m)  Wt 275 lb (124.739 kg)  BMI 50.29 kg/m2     Assessment & Plan:  1. Essential hypertension, benign - CMP14+EGFR - lisinopril-hydrochlorothiazide (ZESTORETIC) 20-12.5 MG tablet; Take 1 tablet by mouth daily.  Dispense: 90 tablet; Refill: 2  2. Type 2 diabetes mellitus with hyperglycemia, without long-term current use of insulin (HCC) -Low carb diet discussed -Blood glucose monitor given to patient- Discussed how to use and educated patient on improtance of good blood glucose control  - POCT glycosylated hemoglobin (Hb A1C) - CMP14+EGFR - metFORMIN (GLUCOPHAGE-XR) 500 MG 24 hr tablet; Take 1 tablet (500 mg total) by mouth daily with breakfast.  Dispense: 90 tablet; Refill: 2  3. Hyperlipidemia -Pt states she does not want her medication to be increased today- Pt states she is going to try to diet and exercise  - CMP14+EGFR - Lipid panel - atorvastatin (LIPITOR) 80 MG tablet; Take 1 tablet (80 mg total) by mouth daily.  Dispense: 90 tablet; Refill: 1  4. Morbid obesity, unspecified obesity type ([place]) - CMP14+EGFR  5. Vitamin D deficiency - CMP14+EGFR - Vit D  25 hydroxy (rtn osteoporosis monitoring) - Vitamin D, Ergocalciferol, (DRISDOL) 50000 UNITS CAPS capsule; Take 1 capsule (50,000 Units total) by mouth every 7 (seven) days.  Dispense: 12 capsule; Refill: 3   Continue all meds Labs pending Health Maintenance reviewed Diet and exercise encouraged RTO 3 months  Evelina Dun, FNP

## 2014-11-28 NOTE — Patient Instructions (Signed)
Mantenimiento de la salud - Mujeres (Health Maintenance, Female) Un estilo de vida saludable y los cuidados preventivos pueden favorecer considerablemente a la salud y el bienestar. Pregunte a su mdico cul es el cronograma de exmenes peridicos apropiado para usted. Esta es una buena oportunidad para consultarlo sobre cmo prevenir enfermedades y mantenerse sano. Adems de los controles, hay muchas otras cosas que puede hacer usted mismo. Los expertos han realizado numerosas investigaciones sobre los cambios en el estilo de vida y las medidas de prevencin que, muy probablemente, lo ayudarn a mantenerse sano. Solicite a su mdico ms informacin. EL PESO Y LA DIETA  Consuma una dieta saludable.  Asegrese de incluir muchas verduras, frutas, productos lcteos de bajo contenido de grasa y protenas magras.  No consuma muchos alimentos de alto contenido de grasas slidas, azcares agregados o sal.  Realice actividad fsica con regularidad. Esta es una de las prcticas ms importantes que puede hacer por su salud.  La mayora de los adultos deben hacer ejercicio durante al menos 150minutos por semana. El ejercicio debe aumentar la frecuencia cardaca y provocar la transpiracin (ejercicio de intensidad moderada).  La mayora de los adultos tambin deben hacer ejercicios de elongacin al menos dos veces a la semana. Agregue esto al su plan de ejercicio de intensidad moderada. Mantenga un peso saludable.  El ndice de masa corporal (IMC) es una medida que puede utilizarse para identificar posibles problemas de peso. Proporciona una estimacin de la grasa corporal basndose en el peso y la altura. Su mdico puede ayudarle a determinar su IMC y a lograr o mantener un peso saludable.  Para las mujeres de 20aos o ms:  Un IMC menor de 18,5 se considera bajo peso.  Un IMC entre 18,5 y 24,9 es normal.  Un IMC entre 25 y 29,9 se considera sobrepeso.  Un IMC de 30 o ms se considera  obesidad. Observe los niveles de colesterol y lpidos en la sangre.  Debe comenzar a realizarse anlisis de lpidos y colesterol en la sangre a los 20aos y luego repetirlos cada 5aos.  Es posible que necesite controlar los niveles de colesterol con mayor frecuencia si:  Sus niveles de lpidos y colesterol son altos.  Es mayor de 50aos.  Presenta un alto riesgo de padecer enfermedades cardacas. DETECCIN DE CNCER  Cncer de pulmn  Se recomienda realizar exmenes de deteccin de cncer de pulmn a personas adultas entre 55 y 80 aos que estn en riesgo de desarrollar cncer de pulmn por sus antecedentes de consumo de tabaco.  Se recomienda una tomografa computarizada de baja dosis de los pulmones todos los aos a las personas que:  Fuman actualmente.  Hayan dejado el hbito en algn momento en los ltimos 15aos.  Hayan fumado durante 30aos un paquete diario. Un paquete-ao equivale a fumar un promedio de un paquete de cigarrillos diario durante un ao.  Los exmenes de deteccin anuales deben continuar hasta que hayan pasado 15aos desde que dej de fumar.  Ya no debern realizarse si tiene un problema de salud que le impida recibir tratamiento para el cncer de pulmn. Cncer de mama  Practique la autoconciencia de la mama. Esto significa reconocer la apariencia normal de sus mamas y cmo las siente.  Tambin significa realizar autoexmenes regulares de las mamas. Informe a su mdico sobre cualquier cambio, sin importar cun pequeo sea.  Si tiene entre 20 y 30 aos, un mdico debe realizarle un examen clnico de las mamas como parte del examen regular de salud, cada   1 a 3aos.  Si tiene 40aos o ms, debe Information systems manager clnico de las [employer]. Tambin considere realizarse una [place] todos los [place].  Si tiene antecedentes familiares de cncer de mama, hable con su mdico para someterse a un estudio  gentico.  Si tiene alto riesgo de Chief Financial Officer de mama, hable con su mdico para someterse a Public house manager y [employer].  La evaluacin del gen del cncer de mama (BRCA) se recomienda a mujeres que tengan familiares con cnceres relacionados con el BRCA. Los cnceres relacionados con el BRCA incluyen los siguientes:  Mama.  Ovario.  Trompas.  Cnceres de peritoneo.  Los resultados de la evaluacin determinarn la necesidad de asesoramiento gentico y de [place] de BRCA1 y BRCA2. Cncer de cuello del tero El mdico puede recomendarle que se haga pruebas peridicas de deteccin de cncer de los rganos de la pelvis (ovarios, tero y vagina). Estas pruebas incluyen un examen plvico, que abarca controlar si se produjeron cambios microscpicos en la superficie del cuello del tero (prueba de Papanicolaou). Pueden recomendarle que se haga estas pruebas cada 3aos, a partir de los 21aos.  A las mujeres que tienen entre 30 y 64aos, los mdicos pueden recomendarles que se sometan a exmenes plvicos y pruebas de Papanicolaou cada 40aos, o a la prueba de Papanicolaou y el examen plvico en combinacin con estudios de deteccin del virus del papiloma humano (VPH) cada 5aos. Algunos tipos de VPH aumentan el riesgo de Chief Financial Officer de cuello del tero. La prueba para la deteccin del VPH tambin puede realizarse a mujeres de cualquier edad cuyos resultados de la prueba de Papanicolaou no sean claros.  Es posible que otros mdicos no recomienden exmenes de deteccin a mujeres no embarazadas que se consideran sujetos de bajo riesgo de Chief Financial Officer de pelvis y que no tienen sntomas. Pregntele al mdico si un examen plvico de deteccin es adecuado para usted.  Si ha recibido un tratamiento para Science writer cervical o una enfermedad que podra causar cncer, necesitar realizarse una prueba de Papanicolaou y controles durante al menos 81 aos de concluido el  [place]. Si no se ha hecho el Papanicolaou con regularidad, debern volver a evaluarse los factores de riesgo (como tener un nuevo compaero sexual), para Teacher, adult education si debe realizarse los estudios nuevamente. Algunas mujeres sufren problemas mdicos que aumentan la probabilidad de Museum/gallery curator cncer de cuello del tero. En estos casos, el mdico podr [employer] se realicen controles y pruebas de Papanicolaou con ms frecuencia. Cncer colorrectal  Este tipo de cncer puede detectarse y a menudo prevenirse.  Por lo general, los estudios de rutina se deben Medical laboratory scientific officer a Field seismologist a Proofreader de los 2 aos y [place] 85 aos.  Sin embargo, el mdico podr aconsejarle que lo haga antes, si tiene factores de riesgo para el cncer de colon.  Tambin puede recomendarle que use un kit de prueba para Hydrologist en la materia fecal.  Es posible que se use una pequea cmara en el extremo de un tubo para examinar directamente el colon (sigmoidoscopia o colonoscopia) a fin de Hydrographic surveyor formas tempranas de cncer colorrectal.  Los exmenes de rutina generalmente comienzan a los 65aos.  El examen directo del colon se debe repetir cada 5 a 10aos hasta los 75aos. Sin embargo, es posible que se realicen exmenes con mayor frecuencia, si se detectan formas tempranas de plipos precancerosos o pequeos bultos. Cncer de piel  Revise  la piel de la cabeza a los pies con regularidad.  Informe a su mdico si aparecen nuevos lunares o los que tiene se modifican, especialmente en su forma y color.  Tambin notifique al mdico si tiene un lunar que es ms grande que el tamao de una goma de lpiz.  Siempre use pantalla solar. Aplique pantalla solar de [place] y repetida a lo largo del Training and development officer.  Protjase usando mangas y [employer], un sombrero de ala ancha y gafas para el sol, siempre que se encuentre en el exterior. ENFERMEDADES CARDACAS, DIABETES E HIPERTENSIN ARTERIAL   La hipertensin  arterial causa enfermedades cardacas y [place] el riesgo de ictus. La hipertensin arterial es ms probable en los siguientes casos:  Las personas que tienen la presin arterial en el extremo del rango normal (100-139/85-89 mm Hg).  Las personas con sobrepeso u obesidad.  Las Retail banker.  Si usted tiene entre 18 y 39 aos, debe medirse la presin arterial cada 3 a 5 aos. Si usted tiene 40 aos o ms, debe medirse la presin arterial [employer]. Debe medirse la presin arterial dos veces: una vez cuando est en un hospital o una clnica y la otra vez cuando est en otro sitio. Registre el promedio de [employer]. Para controlar su presin arterial cuando no est en un hospital o [place], puede usar lo siguiente:  Ardelia Mems mquina automtica para medir la presin arterial en una farmacia.  Un monitor para medir la presin arterial en el hogar.  Si tiene entre 30 y 74 aos, consulte a su mdico si debe tomar aspirina para prevenir el ictus.  Realcese exmenes de deteccin de la diabetes con regularidad. Esto incluye la toma de [place] de sangre para controlar el nivel de azcar en la sangre durante el [place].  Si tiene un peso normal y un bajo riesgo de padecer diabetes, realcese este anlisis cada tres aos despus de los 45aos.  Si tiene sobrepeso y un alto riesgo de padecer diabetes, considere someterse a este anlisis antes o con mayor frecuencia. PREVENCIN DE INFECCIONES  HepatitisB  Si tiene un riesgo ms alto de Museum/gallery curator hepatitis B, debe someterse a un examen de deteccin de este virus. Se considera que tiene un alto riesgo de Museum/gallery curator hepatitis B si:  Naci en un pas donde la hepatitis B es frecuente. Pregntele a su mdico qu pases son considerados de Public affairs consultant.  Sus padres nacieron en un pas de alto riesgo y usted no recibi una vacuna que lo proteja contra la hepatitis B (vacuna contra la hepatitis B).  [place].  [place] agujas  para inyectarse drogas.  Vive con alguien que tiene hepatitis B.  Ha tenido sexo con alguien que tiene hepatitis B.  Recibe tratamiento de hemodilisis.  Toma ciertos medicamentos para el cncer, trasplante de rganos y afecciones autoinmunitarias. Hepatitis C  Se recomienda un anlisis de [place] para:  Todos los que nacieron entre 1945 y [phone].  Todas las personas que tengan un riesgo de haber contrado hepatitis C. Enfermedades de transmisin sexual (ETS).  Debe realizarse pruebas de deteccin de enfermedades de transmisin sexual (ETS), incluidas gonorrea y clamidia si:  Es sexualmente activo y es menor de 24aos.  Es mayor de 24aos, y Investment banker, operational informa que corre riesgo de tener este tipo de infecciones.  La actividad sexual ha cambiado desde que le hicieron la ltima prueba de deteccin y tiene un riesgo mayor de Best boy clamidia o Radio broadcast assistant. Pregntele  al mdico si usted tiene riesgo.  Si no tiene el VIH, pero corre riesgo de infectarse por el virus, se recomienda tomar diariamente un medicamento recetado para evitar la infeccin. Esto se conoce como profilaxis previa a la exposicin. Se considera que est en riesgo si:  Es activo sexualmente y no usa preservativos habitualmente o no conoce el estado del VIH de sus parejas sexuales.  Se inyecta drogas.  Es activo sexualmente con una pareja que tiene VIH. Consulte a su mdico para saber si tiene un alto riesgo de infectarse por el VIH. Si opta por comenzar la profilaxis previa a la exposicin, primero debe realizarse anlisis de deteccin del VIH. Luego, le harn anlisis cada 3meses mientras est tomando los medicamentos para la profilaxis previa a la exposicin.  EMBARAZO   Si es premenopusica y puede quedar embarazada, solicite a su mdico asesoramiento previo a la concepcin.  Si puede quedar embarazada, tome 400 a 800microgramos (mcg) de cido flico todos los das.  Si desea evitar el embarazo, hable con su  mdico sobre el control de la natalidad (anticoncepcin). OSTEOPOROSIS Y MENOPAUSIA   La osteoporosis es una enfermedad en la que los huesos pierden los minerales y la fuerza por el avance de la edad. El resultado pueden ser fracturas graves en los huesos. El riesgo de osteoporosis puede identificarse con una prueba de densidad sea.  Si tiene 65aos o ms, o si est en riesgo de sufrir osteoporosis y fracturas, pregunte a su mdico si debe someterse a exmenes.  Consulte a su mdico si debe tomar un suplemento de calcio o de vitamina D para reducir el riesgo de osteoporosis.  La menopausia puede presentar ciertos sntomas fsicos y riesgos.  La terapia de reemplazo hormonal puede reducir algunos de estos sntomas y riesgos. Consulte a su mdico para saber si la terapia de reemplazo hormonal es conveniente para usted.  INSTRUCCIONES PARA EL CUIDADO EN EL HOGAR   Realcese los estudios de rutina de la salud, dentales y de la vista.  Mantngase al da con las vacunas.  No consuma ningn producto que contenga tabaco, lo que incluye cigarrillos, tabaco de mascar o cigarrillos electrnicos.  Si est embarazada, no beba alcohol.  Si est amamantando, reduzca el consumo de alcohol y la frecuencia con la que consume.  Si es mujer y no est embarazada limite el consumo de alcohol a no ms de 1 medida por da. Una medida equivale a 12onzas de cerveza, 5onzas de vino o 1onzas de bebidas alcohlicas de alta graduacin.  No consuma drogas.  No comparta agujas.  Solicite ayuda a su mdico si necesita apoyo o informacin para abandonar las drogas.  Informe a su mdico si a menudo se siente deprimido.  Notifique a su mdico si alguna vez ha sido vctima de abuso o si no se siente seguro en su hogar.   Esta informacin no tiene como fin reemplazar el consejo del mdico. Asegrese de hacerle al mdico cualquier pregunta que tenga.   Document Released: 01/24/2011 Document Revised:  02/25/2014 Elsevier Interactive Patient Education 2016 Elsevier Inc.  

## 2014-11-29 ENCOUNTER — Other Ambulatory Visit: Payer: Self-pay | Admitting: Family

## 2014-11-29 DIAGNOSIS — E559 Vitamin D deficiency, unspecified: Secondary | ICD-10-CM

## 2014-11-29 LAB — CMP14+EGFR
A/G RATIO: 1.3 (ref 1.1–2.5)
ALBUMIN: 4.2 g/dL (ref 3.5–5.5)
ALT: 21 IU/L (ref 0–32)
AST: 20 IU/L (ref 0–40)
Alkaline Phosphatase: 139 IU/L — ABNORMAL HIGH (ref 39–117)
BILIRUBIN TOTAL: 0.2 mg/dL (ref 0.0–1.2)
BUN / CREAT RATIO: 21 (ref 9–23)
BUN: 11 mg/dL (ref 6–24)
CHLORIDE: 98 mmol/L (ref 97–108)
CO2: 24 mmol/L (ref 18–29)
Calcium: 9.1 mg/dL (ref 8.7–10.2)
Creatinine, Ser: 0.52 mg/dL — ABNORMAL LOW (ref 0.57–1.00)
GFR calc non Af Amer: 115 mL/min/{1.73_m2} (ref >59)
GFR, EST AFRICAN AMERICAN: 133 mL/min/{1.73_m2} (ref >59)
GLUCOSE: 132 mg/dL — AB (ref 65–99)
Globulin, Total: 3.2 g/dL (ref 1.5–4.5)
POTASSIUM: 4.9 mmol/L (ref 3.5–5.2)
Sodium: 136 mmol/L (ref 134–144)
Total Protein: 7.4 g/dL (ref 6.0–8.5)

## 2014-11-29 LAB — LIPID PANEL
Chol/HDL Ratio: 6.3 ratio units — ABNORMAL HIGH (ref 0.0–4.4)
Cholesterol, Total: 213 mg/dL — ABNORMAL HIGH (ref 100–199)
HDL: 34 mg/dL — ABNORMAL LOW (ref >39)
LDL Calculated: 134 mg/dL — ABNORMAL HIGH (ref 0–99)
Triglycerides: 224 mg/dL — ABNORMAL HIGH (ref 0–149)
VLDL CHOLESTEROL CAL: 45 mg/dL — AB (ref 5–40)

## 2014-11-29 LAB — VITAMIN D 25 HYDROXY (VIT D DEFICIENCY, FRACTURES): VIT D 25 HYDROXY: 18.4 ng/mL — AB (ref 30.0–100.0)

## 2014-11-29 MED ORDER — VITAMIN D (ERGOCALCIFEROL) 1.25 MG (50000 UNIT) PO CAPS: 50000.0000 [IU] | ORAL_CAPSULE | ORAL | Status: DC

## 2014-11-30 ENCOUNTER — Telehealth: Payer: Self-pay | Admitting: Family

## 2014-11-30 NOTE — Telephone Encounter (Signed)
Patient aware of results.

## 2015-03-02 ENCOUNTER — Encounter: Payer: Self-pay | Admitting: Family

## 2015-03-02 ENCOUNTER — Ambulatory Visit (INDEPENDENT_AMBULATORY_CARE_PROVIDER_SITE_OTHER): Payer: BLUE CROSS/BLUE SHIELD | Admitting: Family

## 2015-03-02 VITALS — BP 129/80 | HR 77 | Temp 98.3°F | Ht 62.0 in | Wt 271.0 lb

## 2015-03-02 DIAGNOSIS — E1165 Type 2 diabetes mellitus with hyperglycemia: Secondary | ICD-10-CM | POA: Diagnosis not present

## 2015-03-02 DIAGNOSIS — E559 Vitamin D deficiency, unspecified: Secondary | ICD-10-CM

## 2015-03-02 DIAGNOSIS — E785 Hyperlipidemia, unspecified: Secondary | ICD-10-CM | POA: Diagnosis not present

## 2015-03-02 DIAGNOSIS — I10 Essential (primary) hypertension: Secondary | ICD-10-CM

## 2015-03-02 LAB — POCT GLYCOSYLATED HEMOGLOBIN (HGB A1C): Hemoglobin A1C: 6.6

## 2015-03-02 MED ORDER — LANCETS 30G MISC
Status: AC
Start: 2015-03-02 — End: ?

## 2015-03-02 NOTE — Progress Notes (Signed)
Subjective:    Patient ID: Gabriela Watkins, female    DOB: 1965-03-12, 50 y.o.   MRN: [id number]  Pt presents to the office today for chronic follow up. Diabetes She presents for her follow-up diabetic visit. She has type 2 diabetes mellitus. Her disease course has been stable. There are no hypoglycemic associated symptoms. Pertinent negatives for hypoglycemia include no dizziness or headaches. Pertinent negatives for diabetes include no chest pain, no foot paresthesias, no foot ulcerations and no visual change. There are no hypoglycemic complications. Pertinent negatives for diabetic complications include no CVA, heart disease or peripheral neuropathy. Risk factors for coronary artery disease include diabetes mellitus, dyslipidemia, hypertension and sedentary lifestyle. Current diabetic treatment includes oral agent (monotherapy). She is compliant with treatment all of the time. She is following a generally unhealthy diet. She rarely participates in exercise. Her breakfast blood glucose range is generally 140-180 mg/dl. (Pt does not take Blood sugars) An ACE inhibitor/angiotensin II receptor blocker is being taken. Eye exam is current (Pt states August 2016).  Hypertension This is a chronic problem. The current episode started more than 1 year ago. The problem has been resolved since onset. The problem is controlled. Associated symptoms include peripheral edema. Pertinent negatives include no anxiety, chest pain, headaches, palpitations or shortness of breath. Risk factors for coronary artery disease include diabetes mellitus, dyslipidemia, obesity and sedentary lifestyle. Past treatments include ACE inhibitors and diuretics. The current treatment provides significant improvement. Compliance problems include exercise.  There is no history of kidney disease, CAD/MI, CVA, heart failure or a thyroid problem. There is no history of sleep apnea.  Hyperlipidemia This is a chronic problem. The current episode  started more than 1 year ago. The problem is uncontrolled. Recent lipid tests were reviewed and are high. Exacerbating diseases include diabetes. She has no history of hypothyroidism. Factors aggravating her hyperlipidemia include fatty foods. Pertinent negatives include no chest pain, leg pain, myalgias or shortness of breath. Current antihyperlipidemic treatment includes statins. The current treatment provides mild improvement of lipids. Risk factors for coronary artery disease include diabetes mellitus, dyslipidemia, hypertension, obesity, post-menopausal and a sedentary lifestyle.      Review of Systems  Constitutional: Negative.   HENT: Negative.   Eyes: Negative.   Respiratory: Negative.  Negative for shortness of breath.   Cardiovascular: Negative.  Negative for chest pain and palpitations.  Gastrointestinal: Negative.   Endocrine: Negative.   Genitourinary: Negative.   Musculoskeletal: Negative.  Negative for myalgias.  Neurological: Negative.  Negative for dizziness and headaches.  Hematological: Negative.   Psychiatric/Behavioral: Negative.   All other systems reviewed and are negative.      Objective:   Physical Exam  Constitutional: She is oriented to person, place, and time. She appears well-developed and well-nourished. No distress.  Morbid obese   HENT:  Head: Normocephalic and atraumatic.  Right Ear: External ear normal.  Left Ear: External ear normal.  Nose: Nose normal.  Mouth/Throat: Oropharynx is clear and moist.  Eyes: Pupils are equal, round, and reactive to light.  Neck: Normal range of motion. Neck supple. No thyromegaly present.  Cardiovascular: Normal rate, regular rhythm, normal heart sounds and intact distal pulses.   No murmur heard. Pulmonary/Chest: Effort normal and breath sounds normal. No respiratory distress. She has no wheezes.  Abdominal: Soft. Bowel sounds are normal. She exhibits no distension. There is no tenderness.  Musculoskeletal:  Normal range of motion. She exhibits no edema or tenderness.  Neurological: She is alert and oriented to  person, place, and time. She has normal reflexes. No cranial nerve deficit.  Skin: Skin is warm and dry.  Psychiatric: She has a normal mood and affect. Her behavior is normal. Judgment and thought content normal.  Vitals reviewed.   BP 129/80 mmHg  Pulse 77  Temp(Src) 98.3 F (36.8 C) (Oral)  Ht '5\' 2"'$  (1.575 m)  Wt 271 lb (122.925 kg)  BMI 49.55 kg/m2       Assessment & Plan:  1. Essential hypertension, benign - CMP14+EGFR  2. Type 2 diabetes mellitus with hyperglycemia, without long-term current use of insulin (HCC) - POCT glycosylated hemoglobin (Hb A1C) - CMP14+EGFR - Lancets 30G MISC; Check blood glucose BID and prn  Dispense: 100 each; Refill: 6  3. Morbid obesity, unspecified obesity type ([place]) - CMP14+EGFR  4. Hyperlipidemia - CMP14+EGFR - Lipid panel  5. Vitamin D deficiency - CMP14+EGFR - VITAMIN D 25 Hydroxy (Vit-D Deficiency, Fractures)   Continue all meds Labs pending Health Maintenance reviewed Diet and exercise encouraged RTO 3 months  Evelina Dun, FNP

## 2015-03-02 NOTE — Patient Instructions (Signed)
Gabriela Watkins (Health Maintenance, Female) Un estilo de vida saludable y los cuidados preventivos pueden favorecer considerablemente a la salud y Musician. Pregunte a su mdico cul es el cronograma de exmenes peridicos apropiado para usted. Esta es una buena oportunidad para consultarlo sobre cmo prevenir enfermedades y Gabriela Watkins sano. Adems de los controles, hay muchas otras cosas que puede hacer usted mismo. Los expertos han realizado numerosas investigaciones Gabriela Watkins cambios en el estilo de vida y las medidas de prevencin que, [name], lo ayudarn a mantenerse sano. Solicite a su mdico ms informacin. EL PESO Y LA DIETA  Consuma una dieta saludable.  Asegrese de [name] verduras, frutas, productos lcteos de bajo contenido de Gabriela Watkins y [name].  No consuma muchos alimentos de alto contenido de grasas slidas, azcares agregados o sal.  Realice actividad fsica con regularidad. Esta es una de las prcticas ms importantes que puede hacer por su salud.  La mayora de los adultos deben hacer ejercicio durante al menos 173mnutos por semana. El ejercicio debe aumentar la frecuencia cardaca y pActorla transpiracin (ejercicio de iEnola.  La mayora de los adultos tambin deben hField seismologistejercicios de elongacin al mToysRusveces a la semana. Agregue esto al su plan de ejercicio de intensidad moderada. Mantenga un peso saludable.  El ndice de masa corporal ([name] es una medida que puede utilizarse para identificar posibles problemas de Gabriela Watkins una estimacin de la grasa corporal basndose en el peso y la altura. Su mdico puede ayudarle a dRadiation protection practitionerIAvenely a lScientist, Gabriela Watkins peso saludable.  Para las mujeres de 20aos o ms:  Un IMadera Ambulatory Endoscopy Centermenor de 18,5 se considera bajo peso.  Un IAbbeville Area Medical Centerentre 18,5 y 24,9 es normal.  Un IMethodist Hospitalentre 25 y 29,9 se considera sobrepeso.  Un IMC de 30 o ms se considera  obesidad. Observe los niveles de colesterol y lpidos en la sangre.  Debe comenzar a rEnglish as a second language teacherde lpidos y cResearch officer, trade unionen la sangre a los 20aos y luego repetirlos cada 513aos  Es posible que Gabriela engineerlos niveles de colesterol con mayor frecuencia si:  Sus niveles de lpidos y colesterol son altos.  Es mayor de 50aos.  Presenta un alto riesgo de padecer enfermedades cardacas. DETECCIN DE CNCER  Cncer de pulmn  Se recomienda realizar exmenes de deteccin de cncer de pulmn a personas adultas entre 557y 833aos que estn en riesgo de dHorticulturist, commercialde pulmn por sus antecedentes de consumo de tabaco.  Se recomienda una tomografa computarizada de baja dosis de los pLiberty Mediaaos a las personas que:  Fuman actualmente.  Hayan dejado el hbito en algn momento en los ltimos 15aos.  Hayan fumado durante 30aos un paquete diario. Un paquete-ao equivale a fumar un promedio de un paquete de cigarrillos diario durante un ao.  Los exmenes de deteccin anuales deben continuar hasta que hayan pasado 15aos desde que dej de fumar.  Ya no debern realizarse si tiene un problema de salud que le impida recibir tratamiento para eScience writerde pulmn. Cncer de mama  Practique la autoconciencia de la mama. Esto significa reconocer la apariencia normal de sus mamas y cmo las siente.  Tambin significa realizar autoexmenes regulares de lJohnson & Gabriela Informe a su mdico sobre cualquier cambio, sin importar cun pequeo sea.  Si tiene entre 20 y 31aos, un mdico debe realizarle un examen clnico de las mBrunswick Corporationparte del examen regular de sHohenwald Watkins  1 a 3aos.  Si tiene 40aos o ms, debe realizarse un examen clnico de las mamas todos los aos. Tambin considere realizarse una radiografa de las mamas (mamografa) todos los aos.  Si tiene antecedentes familiares de cncer de mama, hable con su mdico para someterse a un estudio  gentico.  Si tiene alto riesgo de padecer cncer de mama, hable con su mdico para someterse a una resonancia magntica y una mamografa todos los aos.  La evaluacin del gen del cncer de mama (BRCA) se recomienda a mujeres que tengan familiares con cnceres relacionados con el BRCA. Los cnceres relacionados con el BRCA incluyen los siguientes:  Mama.  Ovario.  Trompas.  Cnceres de peritoneo.  Los resultados de la evaluacin determinarn la necesidad de asesoramiento gentico y de anlisis de BRCA1 y BRCA2. Cncer de cuello del tero El mdico puede recomendarle que se haga pruebas peridicas de deteccin de cncer de los rganos de la pelvis (ovarios, tero y vagina). Estas pruebas incluyen un examen plvico, que abarca controlar si se produjeron cambios microscpicos en la superficie del cuello del tero (prueba de Papanicolaou). Pueden recomendarle que se haga estas pruebas cada 3aos, a partir de los 21aos.  A las mujeres que tienen entre 30 y 65aos, los mdicos pueden recomendarles que se sometan a exmenes plvicos y pruebas de Papanicolaou cada 3aos, o a la prueba de Papanicolaou y el examen plvico en combinacin con estudios de deteccin del virus del papiloma humano (VPH) cada 5aos. Algunos tipos de VPH aumentan el riesgo de padecer cncer de cuello del tero. La prueba para la deteccin del VPH tambin puede realizarse a mujeres de cualquier edad cuyos resultados de la prueba de Papanicolaou no sean claros.  Es posible que otros mdicos no recomienden exmenes de deteccin a mujeres no embarazadas que se consideran sujetos de bajo riesgo de padecer cncer de pelvis y que no tienen sntomas. Pregntele al mdico si un examen plvico de deteccin es adecuado para usted.  Si ha recibido un tratamiento para el cncer cervical o una enfermedad que podra causar cncer, necesitar realizarse una prueba de Papanicolaou y controles durante al menos 20 aos de concluido el  tratamiento. Si no se ha hecho el Papanicolaou con regularidad, debern volver a evaluarse los factores de riesgo (como tener un nuevo compaero sexual), para determinar si debe realizarse los estudios nuevamente. Algunas mujeres sufren problemas mdicos que aumentan la probabilidad de contraer cncer de cuello del tero. En estos casos, el mdico podr indicar que se realicen controles y pruebas de Papanicolaou con ms frecuencia. Cncer colorrectal  Este tipo de cncer puede detectarse y a menudo prevenirse.  Por lo general, los estudios de rutina se deben comenzar a hacer a partir de los 50 aos y hasta los 75 aos.  Sin embargo, el mdico podr aconsejarle que lo haga antes, si tiene factores de riesgo para el cncer de colon.  Tambin puede recomendarle que use un kit de prueba para hallar sangre oculta en la materia fecal.  Es posible que se use una pequea cmara en el extremo de un tubo para examinar directamente el colon (sigmoidoscopia o colonoscopia) a fin de detectar formas tempranas de cncer colorrectal.  Los exmenes de rutina generalmente comienzan a los 50aos.  El examen directo del colon se debe repetir cada 5 a 10aos hasta los 75aos. Sin embargo, es posible que se realicen exmenes con mayor frecuencia, si se detectan formas tempranas de plipos precancerosos o pequeos bultos. Cncer de piel  Revise   la piel de la cabeza a los pies con regularidad.  Informe a su mdico si aparecen nuevos lunares o los que tiene se modifican, especialmente en su forma y color.  Tambin notifique al mdico si tiene un lunar que es ms grande que el tamao de una goma de lpiz.  Siempre use pantalla solar. Aplique pantalla solar de Kerry Dory y repetida a lo largo del Training and development officer.  Protjase usando mangas y [employer], un sombrero de ala ancha y gafas para el sol, siempre que se encuentre en el exterior. ENFERMEDADES CARDACAS, DIABETES E HIPERTENSIN ARTERIAL   La hipertensin  arterial causa enfermedades cardacas y [place] el riesgo de ictus. La hipertensin arterial es ms probable en los siguientes casos:  Las personas que tienen la presin arterial en el extremo del rango normal (100-139/85-89 mm Hg).  Las personas con sobrepeso u obesidad.  Las Retail banker.  Si usted tiene entre 18 y 39 aos, debe medirse la presin arterial cada 3 a 5 aos. Si usted tiene 40 aos o ms, debe medirse la presin arterial [employer]. Debe medirse la presin arterial dos veces: una vez cuando est en un hospital o una clnica y la otra vez cuando est en otro sitio. Registre el promedio de [employer]. Para controlar su presin arterial cuando no est en un hospital o [place], puede usar lo siguiente:  Ardelia Mems mquina automtica para medir la presin arterial en una farmacia.  Un monitor para medir la presin arterial en el hogar.  Si tiene entre 33 y 105 aos, consulte a su mdico si debe tomar aspirina para prevenir el ictus.  Realcese exmenes de deteccin de la diabetes con regularidad. Esto incluye la toma de [place] de sangre para controlar el nivel de azcar en la sangre durante el [place].  Si tiene un peso normal y un bajo riesgo de padecer diabetes, realcese este anlisis cada tres aos despus de los 45aos.  Si tiene sobrepeso y un alto riesgo de padecer diabetes, considere someterse a este anlisis antes o con mayor frecuencia. PREVENCIN DE INFECCIONES  HepatitisB  Si tiene un riesgo ms alto de Museum/gallery curator hepatitis B, debe someterse a un examen de deteccin de este virus. Se considera que tiene un alto riesgo de Museum/gallery curator hepatitis B si:  Naci en un pas donde la hepatitis B es frecuente. Pregntele a su mdico qu pases son considerados de Public affairs consultant.  Sus padres nacieron en un pas de alto riesgo y usted no recibi una vacuna que lo proteja contra la hepatitis B (vacuna contra la hepatitis B).  [place] agujas  para inyectarse drogas.  Vive con alguien que tiene hepatitis B.  Ha tenido sexo con alguien que tiene hepatitis B.  Recibe tratamiento de hemodilisis.  Toma ciertos medicamentos para el cncer, trasplante de rganos y afecciones autoinmunitarias. Hepatitis C  Se recomienda un anlisis de [place] para:  Todos los que nacieron entre 1943 y [id number].  Todas las personas que tengan un riesgo de haber contrado hepatitis C. Enfermedades de transmisin sexual (ETS).  Debe realizarse pruebas de deteccin de enfermedades de transmisin sexual (ETS), incluidas gonorrea y clamidia si:  Es sexualmente activo y es menor de 24aos.  Es mayor de 24aos, y Investment banker, operational informa que corre riesgo de tener este tipo de infecciones.  La actividad sexual ha cambiado desde que le hicieron la ltima prueba de deteccin y tiene un riesgo mayor de Best boy clamidia o Radio broadcast assistant. Pregntele  al mdico si usted tiene riesgo.  Si no tiene el VIH, pero corre riesgo de infectarse por el virus, se recomienda tomar diariamente un medicamento recetado para evitar la infeccin. Esto se conoce como profilaxis previa a la exposicin. Se considera que est en riesgo si:  Es activo sexualmente y no usa preservativos habitualmente o no conoce el estado del VIH de sus parejas sexuales.  Se inyecta drogas.  Es activo sexualmente con una pareja que tiene VIH. Consulte a su mdico para saber si tiene un alto riesgo de infectarse por el VIH. Si opta por comenzar la profilaxis previa a la exposicin, primero debe realizarse anlisis de deteccin del VIH. Luego, le harn anlisis cada 3meses mientras est tomando los medicamentos para la profilaxis previa a la exposicin.  EMBARAZO   Si es premenopusica y puede quedar embarazada, solicite a su mdico asesoramiento previo a la concepcin.  Si puede quedar embarazada, tome 400 a 800microgramos (mcg) de cido flico todos los das.  Si desea evitar el embarazo, hable con su  mdico sobre el control de la natalidad (anticoncepcin). OSTEOPOROSIS Y MENOPAUSIA   La osteoporosis es una enfermedad en la que los huesos pierden los minerales y la fuerza por el avance de la edad. El resultado pueden ser fracturas graves en los huesos. El riesgo de osteoporosis puede identificarse con una prueba de densidad sea.  Si tiene 65aos o ms, o si est en riesgo de sufrir osteoporosis y fracturas, pregunte a su mdico si debe someterse a exmenes.  Consulte a su mdico si debe tomar un suplemento de calcio o de vitamina D para reducir el riesgo de osteoporosis.  La menopausia puede presentar ciertos sntomas fsicos y riesgos.  La terapia de reemplazo hormonal puede reducir algunos de estos sntomas y riesgos. Consulte a su mdico para saber si la terapia de reemplazo hormonal es conveniente para usted.  INSTRUCCIONES PARA EL CUIDADO EN EL HOGAR   Realcese los estudios de rutina de la salud, dentales y de la vista.  Mantngase al da con las vacunas.  No consuma ningn producto que contenga tabaco, lo que incluye cigarrillos, tabaco de mascar o cigarrillos electrnicos.  Si est embarazada, no beba alcohol.  Si est amamantando, reduzca el consumo de alcohol y la frecuencia con la que consume.  Si es mujer y no est embarazada limite el consumo de alcohol a no ms de 1 medida por da. Una medida equivale a 12onzas de cerveza, 5onzas de vino o 1onzas de bebidas alcohlicas de alta graduacin.  No consuma drogas.  No comparta agujas.  Solicite ayuda a su mdico si necesita apoyo o informacin para abandonar las drogas.  Informe a su mdico si a menudo se siente deprimido.  Notifique a su mdico si alguna vez ha sido vctima de abuso o si no se siente seguro en su hogar.   Esta informacin no tiene como fin reemplazar el consejo del mdico. Asegrese de hacerle al mdico cualquier pregunta que tenga.   Document Released: 01/24/2011 Document Revised:  02/25/2014 Elsevier Interactive Patient Education 2016 Elsevier Inc.  

## 2015-03-03 ENCOUNTER — Other Ambulatory Visit: Payer: Self-pay | Admitting: Family

## 2015-03-03 LAB — CMP14+EGFR
ALBUMIN: 4.1 g/dL (ref 3.5–5.5)
ALK PHOS: 136 IU/L — AB (ref 39–117)
ALT: 23 IU/L (ref 0–32)
AST: 27 IU/L (ref 0–40)
Albumin/Globulin Ratio: 1.4 (ref 1.1–2.5)
BILIRUBIN TOTAL: 0.3 mg/dL (ref 0.0–1.2)
BUN / CREAT RATIO: 30 — AB (ref 9–23)
BUN: 16 mg/dL (ref 6–24)
CHLORIDE: 100 mmol/L (ref 96–106)
CO2: 22 mmol/L (ref 18–29)
Calcium: 9 mg/dL (ref 8.7–10.2)
Creatinine, Ser: 0.53 mg/dL — ABNORMAL LOW (ref 0.57–1.00)
GFR calc Af Amer: 131 mL/min/{1.73_m2} (ref >59)
GFR calc non Af Amer: 113 mL/min/{1.73_m2} (ref >59)
GLOBULIN, TOTAL: 2.9 g/dL (ref 1.5–4.5)
Glucose: 128 mg/dL — ABNORMAL HIGH (ref 65–99)
Potassium: 4.8 mmol/L (ref 3.5–5.2)
Sodium: 138 mmol/L (ref 134–144)
Total Protein: 7 g/dL (ref 6.0–8.5)

## 2015-03-03 LAB — LIPID PANEL
CHOLESTEROL TOTAL: 133 mg/dL (ref 100–199)
Chol/HDL Ratio: 4 ratio units (ref 0.0–4.4)
HDL: 33 mg/dL — AB (ref >39)
LDL CALC: 70 mg/dL (ref 0–99)
Triglycerides: 148 mg/dL (ref 0–149)
VLDL CHOLESTEROL CAL: 30 mg/dL (ref 5–40)

## 2015-03-03 LAB — VITAMIN D 25 HYDROXY (VIT D DEFICIENCY, FRACTURES): VIT D 25 HYDROXY: 22.6 ng/mL — AB (ref 30.0–100.0)

## 2015-03-07 ENCOUNTER — Telehealth: Payer: Self-pay | Admitting: Family

## 2015-03-07 DIAGNOSIS — I10 Essential (primary) hypertension: Secondary | ICD-10-CM

## 2015-03-07 DIAGNOSIS — E785 Hyperlipidemia, unspecified: Secondary | ICD-10-CM

## 2015-03-07 DIAGNOSIS — E1165 Type 2 diabetes mellitus with hyperglycemia: Secondary | ICD-10-CM

## 2015-03-08 MED ORDER — LISINOPRIL-HYDROCHLOROTHIAZIDE 20-12.5 MG PO TABS: 1.0000 | ORAL_TABLET | Freq: Every day | ORAL | Status: DC

## 2015-03-08 MED ORDER — ATORVASTATIN CALCIUM 80 MG PO TABS: 80.0000 mg | ORAL_TABLET | Freq: Every day | ORAL | Status: DC

## 2015-03-08 MED ORDER — METFORMIN HCL ER 500 MG PO TB24: 500.0000 mg | ORAL_TABLET | Freq: Every day | ORAL | Status: DC

## 2015-03-08 NOTE — Telephone Encounter (Signed)
done

## 2015-06-01 ENCOUNTER — Encounter: Payer: Self-pay | Admitting: Family

## 2015-06-01 ENCOUNTER — Ambulatory Visit (INDEPENDENT_AMBULATORY_CARE_PROVIDER_SITE_OTHER): Payer: BLUE CROSS/BLUE SHIELD | Admitting: Family

## 2015-06-01 VITALS — BP 116/69 | HR 69 | Temp 98.0°F | Ht 62.0 in | Wt 275.0 lb

## 2015-06-01 DIAGNOSIS — I10 Essential (primary) hypertension: Secondary | ICD-10-CM | POA: Diagnosis not present

## 2015-06-01 DIAGNOSIS — E1165 Type 2 diabetes mellitus with hyperglycemia: Secondary | ICD-10-CM

## 2015-06-01 DIAGNOSIS — E785 Hyperlipidemia, unspecified: Secondary | ICD-10-CM | POA: Diagnosis not present

## 2015-06-01 DIAGNOSIS — E559 Vitamin D deficiency, unspecified: Secondary | ICD-10-CM | POA: Diagnosis not present

## 2015-06-01 LAB — BAYER DCA HB A1C WAIVED: HB A1C (BAYER DCA - WAIVED): 6.8 % (ref <7.0)

## 2015-06-01 MED ORDER — BLOOD GLUCOSE MONITOR KIT: PACK | Status: AC

## 2015-06-01 NOTE — Patient Instructions (Signed)
Gabriela Watkins (Health Maintenance, Female) Un estilo de vida saludable y los cuidados preventivos pueden favorecer considerablemente a la salud y Musician. Pregunte a su mdico cul es el cronograma de exmenes peridicos apropiado para usted. Esta es una buena oportunidad para consultarlo sobre cmo prevenir enfermedades y [name]. Adems de los controles, hay muchas otras cosas que puede hacer usted mismo. Los expertos han realizado numerosas investigaciones [name] cambios en el estilo de vida y las medidas de prevencin que, [name], lo ayudarn a mantenerse sano. Solicite a su mdico ms informacin. EL PESO Y LA DIETA  Consuma una dieta saludable.  Asegrese de [name] verduras, frutas, productos lcteos de bajo contenido de [name] y [name].  No consuma muchos alimentos de alto contenido de grasas slidas, azcares agregados o sal.  Realice actividad fsica con regularidad. Esta es una de las prcticas ms importantes que puede hacer por su salud.  La mayora de los adultos deben hacer ejercicio durante al menos 173mnutos por semana. El ejercicio debe aumentar la frecuencia cardaca y pActorla transpiracin (ejercicio de iEnola.  La mayora de los adultos tambin deben hField seismologistejercicios de elongacin al mToysRusveces a la semana. Agregue esto al su plan de ejercicio de intensidad moderada. Mantenga un peso saludable.  El ndice de masa corporal ([name] es una medida que puede utilizarse para identificar posibles problemas de pBelle Valley Proporciona una estimacin de la grasa corporal basndose en el peso y la altura. Su mdico puede ayudarle a dRadiation protection practitionerIAvenely a lScientist, forensico mTheatre managerun peso saludable.  Para las mujeres de 20aos o ms:  Un IMadera Ambulatory Endoscopy Centermenor de 18,5 se considera bajo peso.  Un IAbbeville Area Medical Centerentre 18,5 y 24,9 es normal.  Un IMethodist Hospitalentre 25 y 29,9 se considera sobrepeso.  Un IMC de 30 o ms se considera  obesidad. Observe los niveles de colesterol y lpidos en la sangre.  Debe comenzar a rEnglish as a second language teacherde lpidos y cResearch officer, trade unionen la sangre a los 20aos y luego repetirlos cada 513aos  Es posible que nAutomotive engineerlos niveles de colesterol con mayor frecuencia si:  Sus niveles de lpidos y colesterol son altos.  Es mayor de 50aos.  Presenta un alto riesgo de padecer enfermedades cardacas. DETECCIN DE CNCER  Cncer de pulmn  Se recomienda realizar exmenes de deteccin de cncer de pulmn a personas adultas entre 557y 833aos que estn en riesgo de dHorticulturist, commercialde pulmn por sus antecedentes de consumo de tabaco.  Se recomienda una tomografa computarizada de baja dosis de los pLiberty Mediaaos a las personas que:  Fuman actualmente.  Hayan dejado el hbito en algn momento en los ltimos 15aos.  Hayan fumado durante 30aos un paquete diario. Un paquete-ao equivale a fumar un promedio de un paquete de cigarrillos diario durante un ao.  Los exmenes de deteccin anuales deben continuar hasta que hayan pasado 15aos desde que dej de fumar.  Ya no debern realizarse si tiene un problema de salud que le impida recibir tratamiento para eScience writerde pulmn. Cncer de mama  Practique la autoconciencia de la mama. Esto significa reconocer la apariencia normal de sus mamas y cmo las siente.  Tambin significa realizar autoexmenes regulares de lJohnson & Johnson Informe a su mdico sobre cualquier cambio, sin importar cun pequeo sea.  Si tiene entre 20 y 31aos, un mdico debe realizarle un examen clnico de las mBrunswick Corporationparte del examen regular de sHohenwald cKentucky  1 a 3aos.  Si tiene 40aos o ms, debe realizarse un examen clnico de las mamas todos los aos. Tambin considere realizarse una radiografa de las mamas (mamografa) todos los aos.  Si tiene antecedentes familiares de cncer de mama, hable con su mdico para someterse a un estudio  gentico.  Si tiene alto riesgo de padecer cncer de mama, hable con su mdico para someterse a una resonancia magntica y una mamografa todos los aos.  La evaluacin del gen del cncer de mama (BRCA) se recomienda a mujeres que tengan familiares con cnceres relacionados con el BRCA. Los cnceres relacionados con el BRCA incluyen los siguientes:  Mama.  Ovario.  Trompas.  Cnceres de peritoneo.  Los resultados de la evaluacin determinarn la necesidad de asesoramiento gentico y de anlisis de BRCA1 y BRCA2. Cncer de cuello del tero El mdico puede recomendarle que se haga pruebas peridicas de deteccin de cncer de los rganos de la pelvis (ovarios, tero y vagina). Estas pruebas incluyen un examen plvico, que abarca controlar si se produjeron cambios microscpicos en la superficie del cuello del tero (prueba de Papanicolaou). Pueden recomendarle que se haga estas pruebas cada 3aos, a partir de los 21aos.  A las mujeres que tienen entre 30 y 65aos, los mdicos pueden recomendarles que se sometan a exmenes plvicos y pruebas de Papanicolaou cada 3aos, o a la prueba de Papanicolaou y el examen plvico en combinacin con estudios de deteccin del virus del papiloma humano (VPH) cada 5aos. Algunos tipos de VPH aumentan el riesgo de padecer cncer de cuello del tero. La prueba para la deteccin del VPH tambin puede realizarse a mujeres de cualquier edad cuyos resultados de la prueba de Papanicolaou no sean claros.  Es posible que otros mdicos no recomienden exmenes de deteccin a mujeres no embarazadas que se consideran sujetos de bajo riesgo de padecer cncer de pelvis y que no tienen sntomas. Pregntele al mdico si un examen plvico de deteccin es adecuado para usted.  Si ha recibido un tratamiento para el cncer cervical o una enfermedad que podra causar cncer, necesitar realizarse una prueba de Papanicolaou y controles durante al menos 20 aos de concluido el  tratamiento. Si no se ha hecho el Papanicolaou con regularidad, debern volver a evaluarse los factores de riesgo (como tener un nuevo compaero sexual), para determinar si debe realizarse los estudios nuevamente. Algunas mujeres sufren problemas mdicos que aumentan la probabilidad de contraer cncer de cuello del tero. En estos casos, el mdico podr indicar que se realicen controles y pruebas de Papanicolaou con ms frecuencia. Cncer colorrectal  Este tipo de cncer puede detectarse y a menudo prevenirse.  Por lo general, los estudios de rutina se deben comenzar a hacer a partir de los 50 aos y hasta los 75 aos.  Sin embargo, el mdico podr aconsejarle que lo haga antes, si tiene factores de riesgo para el cncer de colon.  Tambin puede recomendarle que use un kit de prueba para hallar sangre oculta en la materia fecal.  Es posible que se use una pequea cmara en el extremo de un tubo para examinar directamente el colon (sigmoidoscopia o colonoscopia) a fin de detectar formas tempranas de cncer colorrectal.  Los exmenes de rutina generalmente comienzan a los 50aos.  El examen directo del colon se debe repetir cada 5 a 10aos hasta los 75aos. Sin embargo, es posible que se realicen exmenes con mayor frecuencia, si se detectan formas tempranas de plipos precancerosos o pequeos bultos. Cncer de piel  Revise   la piel de la cabeza a los pies con regularidad.  Informe a su mdico si aparecen nuevos lunares o los que tiene se modifican, especialmente en su forma y color.  Tambin notifique al mdico si tiene un lunar que es ms grande que el tamao de una goma de lpiz.  Siempre use pantalla solar. Aplique pantalla solar de Kerry Dory y repetida a lo largo del Training and development officer.  Protjase usando mangas y [employer], un sombrero de ala ancha y gafas para el sol, siempre que se encuentre en el exterior. ENFERMEDADES CARDACAS, DIABETES E HIPERTENSIN ARTERIAL   La hipertensin  arterial causa enfermedades cardacas y [place] el riesgo de ictus. La hipertensin arterial es ms probable en los siguientes casos:  Las personas que tienen la presin arterial en el extremo del rango normal (100-139/85-89 mm Hg).  Las personas con sobrepeso u obesidad.  Las Retail banker.  Si usted tiene entre 18 y 39 aos, debe medirse la presin arterial cada 3 a 5 aos. Si usted tiene 40 aos o ms, debe medirse la presin arterial [employer]. Debe medirse la presin arterial dos veces: una vez cuando est en un hospital o una clnica y la otra vez cuando est en otro sitio. Registre el promedio de [employer]. Para controlar su presin arterial cuando no est en un hospital o [place], puede usar lo siguiente:  Ardelia Mems mquina automtica para medir la presin arterial en una farmacia.  Un monitor para medir la presin arterial en el hogar.  Si tiene entre 33 y 105 aos, consulte a su mdico si debe tomar aspirina para prevenir el ictus.  Realcese exmenes de deteccin de la diabetes con regularidad. Esto incluye la toma de [place] de sangre para controlar el nivel de azcar en la sangre durante el [place].  Si tiene un peso normal y un bajo riesgo de padecer diabetes, realcese este anlisis cada tres aos despus de los 45aos.  Si tiene sobrepeso y un alto riesgo de padecer diabetes, considere someterse a este anlisis antes o con mayor frecuencia. PREVENCIN DE INFECCIONES  HepatitisB  Si tiene un riesgo ms alto de Museum/gallery curator hepatitis B, debe someterse a un examen de deteccin de este virus. Se considera que tiene un alto riesgo de Museum/gallery curator hepatitis B si:  Naci en un pas donde la hepatitis B es frecuente. Pregntele a su mdico qu pases son considerados de Public affairs consultant.  Sus padres nacieron en un pas de alto riesgo y usted no recibi una vacuna que lo proteja contra la hepatitis B (vacuna contra la hepatitis B).  [place]  [place] agujas  para inyectarse drogas.  Vive con alguien que tiene hepatitis B.  Ha tenido sexo con alguien que tiene hepatitis B.  Recibe tratamiento de hemodilisis.  Toma ciertos medicamentos para el cncer, trasplante de rganos y afecciones autoinmunitarias. Hepatitis C  Se recomienda un anlisis de [place] para:  Todos los que nacieron entre 1943 y [id number].  Todas las personas que tengan un riesgo de haber contrado hepatitis C. Enfermedades de transmisin sexual (ETS).  Debe realizarse pruebas de deteccin de enfermedades de transmisin sexual (ETS), incluidas gonorrea y clamidia si:  Es sexualmente activo y es menor de 24aos.  Es mayor de 24aos, y Investment banker, operational informa que corre riesgo de tener este tipo de infecciones.  La actividad sexual ha cambiado desde que le hicieron la ltima prueba de deteccin y tiene un riesgo mayor de Best boy clamidia o Radio broadcast assistant. Pregntele  al mdico si usted tiene riesgo.  Si no tiene el VIH, pero corre riesgo de infectarse por el virus, se recomienda tomar diariamente un medicamento recetado para evitar la infeccin. Esto se conoce como profilaxis previa a la exposicin. Se considera que est en riesgo si:  Es activo sexualmente y no usa preservativos habitualmente o no conoce el estado del VIH de sus parejas sexuales.  Se inyecta drogas.  Es activo sexualmente con una pareja que tiene VIH. Consulte a su mdico para saber si tiene un alto riesgo de infectarse por el VIH. Si opta por comenzar la profilaxis previa a la exposicin, primero debe realizarse anlisis de deteccin del VIH. Luego, le harn anlisis cada 3meses mientras est tomando los medicamentos para la profilaxis previa a la exposicin.  EMBARAZO   Si es premenopusica y puede quedar embarazada, solicite a su mdico asesoramiento previo a la concepcin.  Si puede quedar embarazada, tome 400 a 800microgramos (mcg) de cido flico todos los das.  Si desea evitar el embarazo, hable con su  mdico sobre el control de la natalidad (anticoncepcin). OSTEOPOROSIS Y MENOPAUSIA   La osteoporosis es una enfermedad en la que los huesos pierden los minerales y la fuerza por el avance de la edad. El resultado pueden ser fracturas graves en los huesos. El riesgo de osteoporosis puede identificarse con una prueba de densidad sea.  Si tiene 65aos o ms, o si est en riesgo de sufrir osteoporosis y fracturas, pregunte a su mdico si debe someterse a exmenes.  Consulte a su mdico si debe tomar un suplemento de calcio o de vitamina D para reducir el riesgo de osteoporosis.  La menopausia puede presentar ciertos sntomas fsicos y riesgos.  La terapia de reemplazo hormonal puede reducir algunos de estos sntomas y riesgos. Consulte a su mdico para saber si la terapia de reemplazo hormonal es conveniente para usted.  INSTRUCCIONES PARA EL CUIDADO EN EL HOGAR   Realcese los estudios de rutina de la salud, dentales y de la vista.  Mantngase al da con las vacunas.  No consuma ningn producto que contenga tabaco, lo que incluye cigarrillos, tabaco de mascar o cigarrillos electrnicos.  Si est embarazada, no beba alcohol.  Si est amamantando, reduzca el consumo de alcohol y la frecuencia con la que consume.  Si es mujer y no est embarazada limite el consumo de alcohol a no ms de 1 medida por da. Una medida equivale a 12onzas de cerveza, 5onzas de vino o 1onzas de bebidas alcohlicas de alta graduacin.  No consuma drogas.  No comparta agujas.  Solicite ayuda a su mdico si necesita apoyo o informacin para abandonar las drogas.  Informe a su mdico si a menudo se siente deprimido.  Notifique a su mdico si alguna vez ha sido vctima de abuso o si no se siente seguro en su hogar.   Esta informacin no tiene como fin reemplazar el consejo del mdico. Asegrese de hacerle al mdico cualquier pregunta que tenga.   Document Released: 01/24/2011 Document Revised:  02/25/2014 Elsevier Interactive Patient Education 2016 Elsevier Inc.  

## 2015-06-01 NOTE — Addendum Note (Signed)
Addended by: Fawn KirkHOLT, CATHY on: 06/01/2015 11:04 AM   Modules accepted: Orders, SmartSet

## 2015-06-01 NOTE — Progress Notes (Signed)
Subjective:    Patient ID: Gabriela Watkins, female    DOB: 03/09/1965, 50 y.o.   MRN: [id number]  Pt presents to the office today for chronic follow up. Diabetes She presents for her follow-up diabetic visit. She has type 2 diabetes mellitus. Her disease course has been stable. There are no hypoglycemic associated symptoms. Pertinent negatives for hypoglycemia include no dizziness or headaches. Pertinent negatives for diabetes include no chest pain, no foot paresthesias, no foot ulcerations and no visual change. There are no hypoglycemic complications. Pertinent negatives for diabetic complications include no CVA, heart disease or peripheral neuropathy. Risk factors for coronary artery disease include diabetes mellitus, dyslipidemia, hypertension and sedentary lifestyle. Current diabetic treatment includes oral agent (monotherapy). She is compliant with treatment all of the time. She is following a generally unhealthy diet. She rarely participates in exercise. (Pt does not take Blood sugars) An ACE inhibitor/angiotensin II receptor blocker is being taken. Eye exam is current (Pt states August 2016).  Hypertension This is a chronic problem. The current episode started more than 1 year ago. The problem has been resolved since onset. The problem is controlled. Associated symptoms include peripheral edema ("some times"). Pertinent negatives include no anxiety, chest pain, headaches, palpitations or shortness of breath. Risk factors for coronary artery disease include diabetes mellitus, dyslipidemia, obesity and sedentary lifestyle. Past treatments include ACE inhibitors and diuretics. The current treatment provides significant improvement. Compliance problems include exercise.  There is no history of kidney disease, CAD/MI, CVA, heart failure or a thyroid problem. There is no history of sleep apnea.  Hyperlipidemia This is a chronic problem. The current episode started more than 1 year ago. The problem is  controlled. Recent lipid tests were reviewed and are normal. Exacerbating diseases include diabetes. She has no history of hypothyroidism. Factors aggravating her hyperlipidemia include fatty foods. Pertinent negatives include no chest pain, leg pain, myalgias or shortness of breath. Current antihyperlipidemic treatment includes statins. The current treatment provides mild improvement of lipids. Risk factors for coronary artery disease include diabetes mellitus, dyslipidemia, hypertension, obesity, post-menopausal and a sedentary lifestyle.      Review of Systems  Constitutional: Negative.   HENT: Negative.   Eyes: Negative.   Respiratory: Negative.  Negative for shortness of breath.   Cardiovascular: Negative.  Negative for chest pain and palpitations.  Gastrointestinal: Negative.   Endocrine: Negative.   Genitourinary: Negative.   Musculoskeletal: Negative.  Negative for myalgias.  Neurological: Negative.  Negative for dizziness and headaches.  Hematological: Negative.   Psychiatric/Behavioral: Negative.   All other systems reviewed and are negative.      Objective:   Physical Exam  Constitutional: She is oriented to person, place, and time. She appears well-developed and well-nourished. No distress.  Morbid obese   HENT:  Head: Normocephalic and atraumatic.  Right Ear: External ear normal.  Left Ear: External ear normal.  Nose: Nose normal.  Mouth/Throat: Oropharynx is clear and moist.  Eyes: Pupils are equal, round, and reactive to light.  Neck: Normal range of motion. Neck supple. No thyromegaly present.  Cardiovascular: Normal rate, regular rhythm, normal heart sounds and intact distal pulses.   No murmur heard. Pulmonary/Chest: Effort normal and breath sounds normal. No respiratory distress. She has no wheezes.  Abdominal: Soft. Bowel sounds are normal. She exhibits no distension. There is no tenderness.  Musculoskeletal: Normal range of motion. She exhibits no edema or  tenderness.  Neurological: She is alert and oriented to person, place, and time. She has normal  reflexes. No cranial nerve deficit.  Skin: Skin is warm and dry.  Psychiatric: She has a normal mood and affect. Her behavior is normal. Judgment and thought content normal.  Vitals reviewed.   BP 116/69 mmHg  Pulse 69  Temp(Src) 98 F (36.7 C) (Oral)  Ht 5' 2"  (1.575 m)  Wt 275 lb (124.739 kg)  BMI 50.29 kg/m2       Assessment & Plan:  1. Essential hypertension, benign - CMP14+EGFR  2. Type 2 diabetes mellitus with hyperglycemia, without long-term current use of insulin (HCC) - blood glucose meter kit and supplies KIT; Dispense based on patient and insurance preference. Use up to four times daily as directed. (FOR ICD-9 250.00, 250.01).  Dispense: 1 each; Refill: 0 - Bayer DCA Hb A1c Waived - CMP14+EGFR - Microalbumin / creatinine urine ratio  3. Hyperlipidemia - CMP14+EGFR - Lipid panel  4. Vitamin D deficiency - CMP14+EGFR - VITAMIN D 25 Hydroxy (Vit-D Deficiency, Fractures)  5. Morbid obesity, unspecified obesity type ([place]) - CMP14+EGFR   Continue all meds Labs pending Health Maintenance reviewed Diet and exercise encouraged RTO 6 months  Evelina Dun, FNP

## 2015-06-02 LAB — CMP14+EGFR
A/G RATIO: 1.5 (ref 1.2–2.2)
ALBUMIN: 4.3 g/dL (ref 3.5–5.5)
ALK PHOS: 133 IU/L — AB (ref 39–117)
ALT: 26 IU/L (ref 0–32)
AST: 24 IU/L (ref 0–40)
BILIRUBIN TOTAL: 0.3 mg/dL (ref 0.0–1.2)
BUN / CREAT RATIO: 23 (ref 9–23)
BUN: 11 mg/dL (ref 6–24)
CHLORIDE: 99 mmol/L (ref 96–106)
CO2: 20 mmol/L (ref 18–29)
Calcium: 9.1 mg/dL (ref 8.7–10.2)
Creatinine, Ser: 0.48 mg/dL — ABNORMAL LOW (ref 0.57–1.00)
GFR calc Af Amer: 135 mL/min/{1.73_m2} (ref >59)
GFR calc non Af Amer: 117 mL/min/{1.73_m2} (ref >59)
GLOBULIN, TOTAL: 2.9 g/dL (ref 1.5–4.5)
Glucose: 135 mg/dL — ABNORMAL HIGH (ref 65–99)
Potassium: 4.4 mmol/L (ref 3.5–5.2)
SODIUM: 137 mmol/L (ref 134–144)
Total Protein: 7.2 g/dL (ref 6.0–8.5)

## 2015-06-02 LAB — LIPID PANEL
CHOLESTEROL TOTAL: 140 mg/dL (ref 100–199)
Chol/HDL Ratio: 4 ratio units (ref 0.0–4.4)
HDL: 35 mg/dL — ABNORMAL LOW (ref >39)
LDL CALC: 76 mg/dL (ref 0–99)
Triglycerides: 143 mg/dL (ref 0–149)
VLDL Cholesterol Cal: 29 mg/dL (ref 5–40)

## 2015-06-02 LAB — VITAMIN D 25 HYDROXY (VIT D DEFICIENCY, FRACTURES): VIT D 25 HYDROXY: 23.7 ng/mL — AB (ref 30.0–100.0)

## 2015-06-30 ENCOUNTER — Other Ambulatory Visit: Payer: Self-pay | Admitting: Family

## 2015-09-02 ENCOUNTER — Other Ambulatory Visit: Payer: Self-pay | Admitting: Family

## 2015-12-04 ENCOUNTER — Ambulatory Visit: Payer: BLUE CROSS/BLUE SHIELD | Admitting: Family

## 2015-12-14 ENCOUNTER — Encounter: Payer: Self-pay | Admitting: Family

## 2015-12-14 ENCOUNTER — Ambulatory Visit (INDEPENDENT_AMBULATORY_CARE_PROVIDER_SITE_OTHER): Payer: BLUE CROSS/BLUE SHIELD | Admitting: Family

## 2015-12-14 VITALS — BP 138/77 | HR 73 | Temp 98.8°F | Ht 62.0 in | Wt 262.6 lb

## 2015-12-14 DIAGNOSIS — I10 Essential (primary) hypertension: Secondary | ICD-10-CM

## 2015-12-14 DIAGNOSIS — E559 Vitamin D deficiency, unspecified: Secondary | ICD-10-CM | POA: Diagnosis not present

## 2015-12-14 DIAGNOSIS — E1165 Type 2 diabetes mellitus with hyperglycemia: Secondary | ICD-10-CM

## 2015-12-14 DIAGNOSIS — E782 Mixed hyperlipidemia: Secondary | ICD-10-CM

## 2015-12-14 LAB — BAYER DCA HB A1C WAIVED: HB A1C: 6.6 % (ref <7.0)

## 2015-12-14 NOTE — Patient Instructions (Signed)
La diabetes mellitus y los alimentos (Diabetes Mellitus and Food) Es importante que controle su nivel de azcar en la sangre (glucosa). El nivel de glucosa en sangre depende en gran medida de lo que usted come. Comer alimentos saludables en las cantidades adecuadas a lo largo del da, aproximadamente a la misma hora todos los das, lo ayudar a controlar su nivel de glucosa en sangre. Tambin puede ayudarlo a retrasar o evitar el empeoramiento de la diabetes mellitus. Comer de manera saludable incluso puede ayudarlo a mejorar el nivel de presin arterial y a alcanzar o mantener un peso saludable.  Entre las recomendaciones generales para alimentarse y cocinar los alimentos de forma saludable, se incluyen las siguientes:  Respetar las comidas principales y comer colaciones con regularidad. Evitar pasar largos perodos sin comer con el fin de perder peso.  Seguir una dieta que consista principalmente en alimentos de origen vegetal, como frutas, vegetales, frutos secos, legumbres y cereales integrales.  Utilizar mtodos de coccin a baja temperatura, como hornear, en lugar de mtodos de coccin a alta temperatura, como frer en abundante aceite. Trabaje con el nutricionista para aprender a usar la informacin nutricional de las etiquetas de los alimentos. CMO PUEDEN AFECTARME LOS ALIMENTOS? Carbohidratos Los carbohidratos afectan el nivel de glucosa en sangre ms que cualquier otro tipo de alimento. El nutricionista lo ayudar a determinar cuntos carbohidratos puede consumir en cada comida y ensearle a contarlos. El recuento de carbohidratos es importante para mantener la glucosa en sangre en un nivel saludable, en especial si utiliza insulina o toma determinados medicamentos para la diabetes mellitus. Alcohol El alcohol puede provocar disminuciones sbitas de la glucosa en sangre (hipoglucemia), en especial si utiliza insulina o toma determinados medicamentos para la diabetes mellitus. La  hipoglucemia es una afeccin que puede poner en peligro la vida. Los sntomas de la hipoglucemia (somnolencia, mareos y desorientacin) son similares a los sntomas de haber consumido mucho alcohol.  Si el mdico lo autoriza a beber alcohol, hgalo con moderacin y siga estas pautas:  Las mujeres no deben beber ms de un trago por da, y los hombres no deben beber ms de dos tragos por da. Un trago es igual a:  12 onzas (355 ml) de cerveza  5 onzas de vino (150 ml) de vino  1,5onzas (45ml) de bebidas espirituosas  No beba con el estmago vaco.  Mantngase hidratado. Beba agua, gaseosas dietticas o t helado sin azcar.  Las gaseosas comunes, los jugos y otros refrescos podran contener muchos carbohidratos y se deben contar. QU ALIMENTOS NO SE RECOMIENDAN? Cuando haga las elecciones de alimentos, es importante que recuerde que todos los alimentos son distintos. Algunos tienen menos nutrientes que otros por porcin, aunque podran tener la misma cantidad de caloras o carbohidratos. Es difcil darle al cuerpo lo que necesita cuando consume alimentos con menos nutrientes. Estos son algunos ejemplos de alimentos que debera evitar ya que contienen muchas caloras y carbohidratos, pero pocos nutrientes:  Grasas trans (la mayora de los alimentos procesados incluyen grasas trans en la etiqueta de Informacin nutricional).  Gaseosas comunes.  Jugos.  Caramelos.  Dulces, como tortas, pasteles, rosquillas y galletas.  Comidas fritas. QU ALIMENTOS PUEDO COMER? Consuma alimentos ricos en nutrientes, que nutrirn el cuerpo y lo mantendrn saludable. Los alimentos que debe comer tambin dependern de varios factores, como:  Las caloras que necesita.  Los medicamentos que toma.  Su peso.  El nivel de glucosa en sangre.  El nivel de presin arterial.  El nivel de colesterol.   Debe consumir una amplia variedad de alimentos, por ejemplo:  Protenas.  Cortes de carne  magros.  Protenas con bajo contenido de grasas saturadas, como pescado, clara de huevo y frijoles. Evite las carnes procesadas.  Frutas y vegetales.  Frutas y vegetales que pueden ayudar a controlar los niveles sanguneos de glucosa, como manzanas, mangos y batatas.  Productos lcteos.  Elija productos lcteos sin grasa o con bajo contenido de grasa, como leche, yogur y queso.  Cereales, panes, pastas y arroz.  Elija cereales integrales, como panes multicereales, avena en grano y arroz integral. Estos alimentos pueden ayudar a controlar la presin arterial.  Grasas.  Alimentos que contengan grasas saludables, como frutos secos, aguacate, aceite de oliva, aceite de canola y pescado. TODOS LOS QUE PADECEN DIABETES MELLITUS TIENEN EL MISMO PLAN DE COMIDAS? Dado que todas las personas que padecen diabetes mellitus son distintas, no hay un solo plan de comidas que funcione para todos. Es muy importante que se rena con un nutricionista que lo ayudar a crear un plan de comidas adecuado para usted.   Esta informacin no tiene como fin reemplazar el consejo del mdico. Asegrese de hacerle al mdico cualquier pregunta que tenga.   Document Released: 05/14/2007 Document Revised: 02/25/2014 Elsevier Interactive Patient Education 2016 Elsevier Inc.  

## 2015-12-14 NOTE — Progress Notes (Signed)
Subjective:    Patient ID: Gabriela Watkins, female    DOB: [birth year], 50 y.o.   MRN: [id number]  Pt presents to the office today for chronic follow up. Diabetes  She presents for her follow-up diabetic visit. She has type 2 diabetes mellitus. Her disease course has been stable. There are no hypoglycemic associated symptoms. Pertinent negatives for hypoglycemia include no dizziness or headaches. Pertinent negatives for diabetes include no chest pain, no foot paresthesias, no foot ulcerations and no visual change. There are no hypoglycemic complications. Pertinent negatives for diabetic complications include no CVA, heart disease or peripheral neuropathy. Risk factors for coronary artery disease include diabetes mellitus, dyslipidemia, hypertension and sedentary lifestyle. Current diabetic treatment includes oral agent (monotherapy). She is compliant with treatment all of the time. She is following a generally unhealthy diet. She rarely participates in exercise. (Pt does not take Blood sugars) An ACE inhibitor/angiotensin II receptor blocker is being taken. Eye exam is not current (Pt states August 2016).  Hypertension  This is a chronic problem. The current episode started more than 1 year ago. The problem has been resolved since onset. The problem is controlled. Pertinent negatives include no anxiety, chest pain, headaches, palpitations, peripheral edema or shortness of breath. Risk factors for coronary artery disease include diabetes mellitus, dyslipidemia, obesity and sedentary lifestyle. Past treatments include ACE inhibitors and diuretics. The current treatment provides significant improvement. Compliance problems include exercise.  There is no history of kidney disease, CAD/MI, CVA, heart failure or a thyroid problem. There is no history of sleep apnea.  Hyperlipidemia  This is a chronic problem. The current episode started more than 1 year ago. The problem is controlled. Recent lipid tests were  reviewed and are normal. Exacerbating diseases include diabetes and obesity. She has no history of hypothyroidism. Factors aggravating her hyperlipidemia include fatty foods. Pertinent negatives include no chest pain, leg pain, myalgias or shortness of breath. Current antihyperlipidemic treatment includes statins. The current treatment provides mild improvement of lipids. Risk factors for coronary artery disease include diabetes mellitus, dyslipidemia, hypertension, obesity, post-menopausal and a sedentary lifestyle.      Review of Systems  Constitutional: Negative.   HENT: Negative.   Eyes: Negative.   Respiratory: Negative.  Negative for shortness of breath.   Cardiovascular: Negative.  Negative for chest pain and palpitations.  Gastrointestinal: Negative.   Endocrine: Negative.   Genitourinary: Negative.   Musculoskeletal: Negative.  Negative for myalgias.  Neurological: Negative.  Negative for dizziness and headaches.  Hematological: Negative.   Psychiatric/Behavioral: Negative.   All other systems reviewed and are negative.      Objective:   Physical Exam  Constitutional: She is oriented to person, place, and time. She appears well-developed and well-nourished. No distress.  Morbid obese   HENT:  Head: Normocephalic and atraumatic.  Right Ear: External ear normal.  Left Ear: External ear normal.  Nose: Nose normal.  Mouth/Throat: Oropharynx is clear and moist.  Eyes: Pupils are equal, round, and reactive to light.  Neck: Normal range of motion. Neck supple. No thyromegaly present.  Cardiovascular: Normal rate, regular rhythm, normal heart sounds and intact distal pulses.   No murmur heard. Pulmonary/Chest: Effort normal and breath sounds normal. No respiratory distress. She has no wheezes.  Abdominal: Soft. Bowel sounds are normal. She exhibits no distension. There is no tenderness.  Musculoskeletal: Normal range of motion. She exhibits no edema or tenderness.    Neurological: She is alert and oriented to person, place, and time. She  has normal reflexes. No cranial nerve deficit.  Skin: Skin is warm and dry.  Psychiatric: She has a normal mood and affect. Her behavior is normal. Judgment and thought content normal.  Vitals reviewed.   BP 138/77   Pulse 73   Temp 98.8 F (37.1 C) (Oral)   Ht '5\' 2"'$  (1.575 m)   Wt 262 lb 9.6 oz (119.1 kg)   BMI 48.03 kg/m        Assessment & Plan:  1. Essential hypertension, benign - CMP14+EGFR  2. Type 2 diabetes mellitus with hyperglycemia, without long-term current use of insulin ([place]) - Ambulatory referral to Ophthalmology - CMP14+EGFR - Bayer [place] Hb A1c Waived - Microalbumin / creatinine urine ratio  3. Vitamin D deficiency - CMP14+EGFR - VITAMIN D 25 Hydroxy (Vit-D Deficiency, Fractures)  4. Obesity, morbid ([place]) - CMP14+EGFR - Lipid panel  5. Mixed hyperlipidemia - CMP14+EGFR - Lipid panel   Continue all meds Labs pending Health Maintenance reviewed Diet and exercise encouraged RTO 4 months   Evelina Dun, FNP

## 2015-12-15 ENCOUNTER — Other Ambulatory Visit: Payer: Self-pay | Admitting: Family

## 2015-12-15 DIAGNOSIS — E559 Vitamin D deficiency, unspecified: Secondary | ICD-10-CM

## 2015-12-15 LAB — VITAMIN D 25 HYDROXY (VIT D DEFICIENCY, FRACTURES): Vit D, 25-Hydroxy: 14.2 ng/mL — ABNORMAL LOW (ref 30.0–100.0)

## 2015-12-15 LAB — LIPID PANEL
CHOLESTEROL TOTAL: 186 mg/dL (ref 100–199)
Chol/HDL Ratio: 5.3 ratio units — ABNORMAL HIGH (ref 0.0–4.4)
HDL: 35 mg/dL — ABNORMAL LOW (ref >39)
LDL CALC: 128 mg/dL — AB (ref 0–99)
TRIGLYCERIDES: 113 mg/dL (ref 0–149)
VLDL CHOLESTEROL CAL: 23 mg/dL (ref 5–40)

## 2015-12-15 LAB — CMP14+EGFR
ALBUMIN: 4 g/dL (ref 3.5–5.5)
ALK PHOS: 122 IU/L — AB (ref 39–117)
ALT: 17 IU/L (ref 0–32)
AST: 16 IU/L (ref 0–40)
Albumin/Globulin Ratio: 1.4 (ref 1.2–2.2)
BILIRUBIN TOTAL: 0.4 mg/dL (ref 0.0–1.2)
BUN / CREAT RATIO: 28 — AB (ref 9–23)
BUN: 18 mg/dL (ref 6–24)
CHLORIDE: 102 mmol/L (ref 96–106)
CO2: 23 mmol/L (ref 18–29)
CREATININE: 0.65 mg/dL (ref 0.57–1.00)
Calcium: 8.9 mg/dL (ref 8.7–10.2)
GFR calc Af Amer: 122 mL/min/{1.73_m2} (ref >59)
GFR calc non Af Amer: 106 mL/min/{1.73_m2} (ref >59)
GLUCOSE: 139 mg/dL — AB (ref 65–99)
Globulin, Total: 2.9 g/dL (ref 1.5–4.5)
Potassium: 4.1 mmol/L (ref 3.5–5.2)
Sodium: 139 mmol/L (ref 134–144)
Total Protein: 6.9 g/dL (ref 6.0–8.5)

## 2015-12-15 LAB — MICROALBUMIN / CREATININE URINE RATIO
Creatinine, Urine: 235.8 mg/dL
MICROALB/CREAT RATIO: 29.9 mg/g{creat} (ref 0.0–30.0)
MICROALBUM., U, RANDOM: 70.4 ug/mL

## 2015-12-15 MED ORDER — VITAMIN D (ERGOCALCIFEROL) 1.25 MG (50000 UNIT) PO CAPS: 50000.0000 [IU] | ORAL_CAPSULE | ORAL | 3 refills | Status: DC

## 2016-01-12 DIAGNOSIS — E119 Type 2 diabetes mellitus without complications: Secondary | ICD-10-CM | POA: Diagnosis not present

## 2016-01-12 DIAGNOSIS — Z7984 Long term (current) use of oral hypoglycemic drugs: Secondary | ICD-10-CM | POA: Diagnosis not present

## 2016-01-12 LAB — HM DIABETES EYE EXAM

## 2016-01-18 ENCOUNTER — Other Ambulatory Visit: Payer: Self-pay | Admitting: Gastroenterology

## 2016-01-18 DIAGNOSIS — R634 Abnormal weight loss: Secondary | ICD-10-CM | POA: Diagnosis not present

## 2016-01-18 DIAGNOSIS — R140 Abdominal distension (gaseous): Secondary | ICD-10-CM | POA: Diagnosis not present

## 2016-01-18 DIAGNOSIS — K439 Ventral hernia without obstruction or gangrene: Secondary | ICD-10-CM | POA: Diagnosis not present

## 2016-01-18 DIAGNOSIS — R1031 Right lower quadrant pain: Secondary | ICD-10-CM | POA: Diagnosis not present

## 2016-01-18 DIAGNOSIS — R1084 Generalized abdominal pain: Secondary | ICD-10-CM

## 2016-01-18 DIAGNOSIS — D500 Iron deficiency anemia secondary to blood loss (chronic): Secondary | ICD-10-CM | POA: Diagnosis not present

## 2016-01-26 ENCOUNTER — Ambulatory Visit
Admission: RE | Admit: 2016-01-26 | Discharge: 2016-01-26 | Disposition: A | Discharge status: Home / Self Care | Payer: BLUE CROSS/BLUE SHIELD | Source: Ambulatory Visit | Attending: Gastroenterology | Admitting: Gastroenterology

## 2016-01-26 DIAGNOSIS — R1084 Generalized abdominal pain: Secondary | ICD-10-CM

## 2016-01-26 DIAGNOSIS — R109 Unspecified abdominal pain: Secondary | ICD-10-CM | POA: Diagnosis not present

## 2016-01-26 MED ORDER — IOPAMIDOL (ISOVUE-300) INJECTION 61%
100.0000 mL | Freq: Once | INTRAVENOUS | Status: AC | PRN
  Administered 2016-01-26: 100 mL via INTRAVENOUS

## 2016-03-07 DIAGNOSIS — K432 Incisional hernia without obstruction or gangrene: Secondary | ICD-10-CM | POA: Diagnosis not present

## 2016-04-18 ENCOUNTER — Ambulatory Visit (INDEPENDENT_AMBULATORY_CARE_PROVIDER_SITE_OTHER): Payer: BLUE CROSS/BLUE SHIELD | Admitting: Family

## 2016-04-18 ENCOUNTER — Encounter: Payer: Self-pay | Admitting: Family

## 2016-04-18 VITALS — BP 133/73 | HR 67 | Temp 98.3°F | Ht 62.0 in | Wt 257.4 lb

## 2016-04-18 DIAGNOSIS — E782 Mixed hyperlipidemia: Secondary | ICD-10-CM | POA: Diagnosis not present

## 2016-04-18 DIAGNOSIS — E1165 Type 2 diabetes mellitus with hyperglycemia: Secondary | ICD-10-CM | POA: Diagnosis not present

## 2016-04-18 DIAGNOSIS — E559 Vitamin D deficiency, unspecified: Secondary | ICD-10-CM

## 2016-04-18 DIAGNOSIS — I10 Essential (primary) hypertension: Secondary | ICD-10-CM | POA: Diagnosis not present

## 2016-04-18 LAB — CMP14+EGFR
A/G RATIO: 1.5 (ref 1.2–2.2)
ALBUMIN: 3.9 g/dL (ref 3.5–5.5)
ALT: 15 IU/L (ref 0–32)
AST: 14 IU/L (ref 0–40)
Alkaline Phosphatase: 95 IU/L (ref 39–117)
BUN / CREAT RATIO: 24 — AB (ref 9–23)
BUN: 12 mg/dL (ref 6–24)
Bilirubin Total: 0.2 mg/dL (ref 0.0–1.2)
CO2: 21 mmol/L (ref 18–29)
Calcium: 8.4 mg/dL — ABNORMAL LOW (ref 8.7–10.2)
Chloride: 103 mmol/L (ref 96–106)
Creatinine, Ser: 0.5 mg/dL — ABNORMAL LOW (ref 0.57–1.00)
GFR, EST AFRICAN AMERICAN: 132 mL/min/{1.73_m2} (ref >59)
GFR, EST NON AFRICAN AMERICAN: 115 mL/min/{1.73_m2} (ref >59)
GLOBULIN, TOTAL: 2.6 g/dL (ref 1.5–4.5)
Glucose: 107 mg/dL — ABNORMAL HIGH (ref 65–99)
POTASSIUM: 4.3 mmol/L (ref 3.5–5.2)
SODIUM: 138 mmol/L (ref 134–144)
TOTAL PROTEIN: 6.5 g/dL (ref 6.0–8.5)

## 2016-04-18 LAB — LIPID PANEL
CHOL/HDL RATIO: 4.9 ratio — AB (ref 0.0–4.4)
Cholesterol, Total: 157 mg/dL (ref 100–199)
HDL: 32 mg/dL — AB (ref >39)
LDL Calculated: 106 mg/dL — ABNORMAL HIGH (ref 0–99)
Triglycerides: 97 mg/dL (ref 0–149)
VLDL Cholesterol Cal: 19 mg/dL (ref 5–40)

## 2016-04-18 LAB — BAYER DCA HB A1C WAIVED: HB A1C: 6 % (ref <7.0)

## 2016-04-18 NOTE — Patient Instructions (Signed)
Mantenimiento de la salud - Mujeres (Health Maintenance, Female) Un estilo de vida saludable y los cuidados preventivos pueden favorecer considerablemente a la salud y el bienestar. Pregunte a su mdico cul es el cronograma de exmenes peridicos apropiado para usted. Esta es una buena oportunidad para consultarlo sobre cmo prevenir enfermedades y mantenerse sano. Adems de los controles, hay muchas otras cosas que puede hacer usted mismo. Los expertos han realizado numerosas investigaciones sobre los cambios en el estilo de vida y las medidas de prevencin que, muy probablemente, lo ayudarn a mantenerse sano. Solicite a su mdico ms informacin. EL PESO Y LA DIETA Consuma una dieta saludable.   Asegrese de incluir muchas verduras, frutas, productos lcteos de bajo contenido de grasa y protenas magras.  No consuma muchos alimentos de alto contenido de grasas slidas, azcares agregados o sal.  Realice actividad fsica con regularidad. Esta es una de las prcticas ms importantes que puede hacer por su salud.  La mayora de los adultos deben hacer ejercicio durante al menos 150minutos por semana. El ejercicio debe aumentar la frecuencia cardaca y provocar la transpiracin (ejercicio de intensidad moderada).  La mayora de los adultos tambin deben hacer ejercicios de elongacin al menos dos veces a la semana. Agregue esto al su plan de ejercicio de intensidad moderada. Mantenga un peso saludable.   El ndice de masa corporal (IMC) es una medida que puede utilizarse para identificar posibles problemas de peso. Proporciona una estimacin de la grasa corporal basndose en el peso y la altura. Su mdico puede ayudarle a determinar su IMC y a lograr o mantener un peso saludable.  Para las mujeres de 20aos o ms:  Un IMC menor de 18,5 se considera bajo peso.  Un IMC entre 18,5 y 24,9 es normal.  Un IMC entre 25 y 29,9 se considera sobrepeso.  Un IMC de 30 o ms se considera  obesidad. Observe los niveles de colesterol y lpidos en la sangre.   Debe comenzar a realizarse anlisis de lpidos y colesterol en la sangre a los 20aos y luego repetirlos cada 5aos.  Es posible que necesite controlar los niveles de colesterol con mayor frecuencia si:  Sus niveles de lpidos y colesterol son altos.  Es mayor de 50aos.  Presenta un alto riesgo de padecer enfermedades cardacas. DETECCIN DE CNCER Cncer de pulmn   Se recomienda realizar exmenes de deteccin de cncer de pulmn a personas adultas entre 55 y 80 aos que estn en riesgo de desarrollar cncer de pulmn por sus antecedentes de consumo de tabaco.  Se recomienda una tomografa computarizada de baja dosis de los pulmones todos los aos a las personas que:  Fuman actualmente.  Hayan dejado el hbito en algn momento en los ltimos 15aos.  Hayan fumado durante 30aos un paquete diario. Un paquete-ao equivale a fumar un promedio de un paquete de cigarrillos diario durante un ao.  Los exmenes de deteccin anuales deben continuar hasta que hayan pasado 15aos desde que dej de fumar.  Ya no debern realizarse si tiene un problema de salud que le impida recibir tratamiento para el cncer de pulmn. Cncer de mama   Practique la autoconciencia de la mama. Esto significa reconocer la apariencia normal de sus mamas y cmo las siente.  Tambin significa realizar autoexmenes regulares de las mamas. Informe a su mdico sobre cualquier cambio, sin importar cun pequeo sea.  Si tiene entre 20 y 30 aos, un mdico debe realizarle un examen clnico de las mamas como parte del examen regular   de salud, cada 1 a 3aos.  Si tiene 40aos o ms, debe realizarse un examen clnico de las mamas todos los aos. Tambin considere realizarse una radiografa de las mamas (mamografa) todos los aos.  Si tiene antecedentes familiares de cncer de mama, hable con su mdico para someterse a un estudio  gentico.  Si tiene alto riesgo de padecer cncer de mama, hable con su mdico para someterse a una resonancia magntica y una mamografa todos los aos.  La evaluacin del gen del cncer de mama (BRCA) se recomienda a mujeres que tengan familiares con cnceres relacionados con el BRCA. Los cnceres relacionados con el BRCA incluyen los siguientes:  Mama.  Ovario.  Trompas.  Cnceres de peritoneo.  Los resultados de la evaluacin determinarn la necesidad de asesoramiento gentico y de anlisis de BRCA1 y BRCA2. Cncer de cuello del tero  El mdico puede recomendarle que se haga pruebas peridicas de deteccin de cncer de los rganos de la pelvis (ovarios, tero y vagina). Estas pruebas incluyen un examen plvico, que abarca controlar si se produjeron cambios microscpicos en la superficie del cuello del tero (prueba de Papanicolaou). Pueden recomendarle que se haga estas pruebas cada 3aos, a partir de los 21aos.  A las mujeres que tienen entre 30 y 65aos, los mdicos pueden recomendarles que se sometan a exmenes plvicos y pruebas de Papanicolaou cada 3aos, o a la prueba de Papanicolaou y el examen plvico en combinacin con estudios de deteccin del virus del papiloma humano (VPH) cada 5aos. Algunos tipos de VPH aumentan el riesgo de padecer cncer de cuello del tero. La prueba para la deteccin del VPH tambin puede realizarse a mujeres de cualquier edad cuyos resultados de la prueba de Papanicolaou no sean claros.  Es posible que otros mdicos no recomienden exmenes de deteccin a mujeres no embarazadas que se consideran sujetos de bajo riesgo de padecer cncer de pelvis y que no tienen sntomas. Pregntele al mdico si un examen plvico de deteccin es adecuado para usted.  Si ha recibido un tratamiento para el cncer cervical o una enfermedad que podra causar cncer, necesitar realizarse una prueba de Papanicolaou y controles durante al menos 20 aos de concluido el  tratamiento. Si no se ha hecho el Papanicolaou con regularidad, debern volver a evaluarse los factores de riesgo (como tener un nuevo compaero sexual), para determinar si debe realizarse los estudios nuevamente. Algunas mujeres sufren problemas mdicos que aumentan la probabilidad de contraer cncer de cuello del tero. En estos casos, el mdico podr indicar que se realicen controles y pruebas de Papanicolaou con ms frecuencia. Cncer colorrectal   Este tipo de cncer puede detectarse y a menudo prevenirse.  Por lo general, los estudios de rutina se deben comenzar a hacer a partir de los 50 aos y hasta los 75 aos.  Sin embargo, el mdico podr aconsejarle que lo haga antes, si tiene factores de riesgo para el cncer de colon.  Tambin puede recomendarle que use un kit de prueba para hallar sangre oculta en la materia fecal.  Es posible que se use una pequea cmara en el extremo de un tubo para examinar directamente el colon (sigmoidoscopia o colonoscopia) a fin de detectar formas tempranas de cncer colorrectal.  Los exmenes de rutina generalmente comienzan a los 50aos.  El examen directo del colon se debe repetir cada 5 a 10aos hasta los 75aos. Sin embargo, es posible que se realicen exmenes con mayor frecuencia, si se detectan formas tempranas de plipos precancerosos o pequeos bultos.   Cncer de piel   Revise la piel de la cabeza a los pies con regularidad.  Informe a su mdico si aparecen nuevos lunares o los que tiene se modifican, especialmente en su forma y color.  Tambin notifique al mdico si tiene un lunar que es ms grande que el tamao de una goma de lpiz.  Siempre use pantalla solar. Aplique pantalla solar de manera libre y repetida a lo largo del da.  Protjase usando mangas y pantalones largos, un sombrero de ala ancha y gafas para el sol, siempre que se encuentre en el exterior. ENFERMEDADES CARDACAS, DIABETES E HIPERTENSIN ARTERIAL  La hipertensin  arterial causa enfermedades cardacas y aumenta el riesgo de ictus. La hipertensin arterial es ms probable en los siguientes casos:  Las personas que tienen la presin arterial en el extremo del rango normal (100-139/85-89 mm Hg).  Las personas con sobrepeso u obesidad.  Las personas afroamericanas.  Si usted tiene entre 18 y 39 aos, debe medirse la presin arterial cada 3 a 5 aos. Si usted tiene 40 aos o ms, debe medirse la presin arterial todos los aos. Debe medirse la presin arterial dos veces: una vez cuando est en un hospital o una clnica y la otra vez cuando est en otro sitio. Registre el promedio de las dos mediciones. Para controlar su presin arterial cuando no est en un hospital o una clnica, puede usar lo siguiente:  Una mquina automtica para medir la presin arterial en una farmacia.  Un monitor para medir la presin arterial en el hogar.  Si tiene entre 55 y 79 aos, consulte a su mdico si debe tomar aspirina para prevenir el ictus.  Realcese exmenes de deteccin de la diabetes con regularidad. Esto incluye la toma de una muestra de sangre para controlar el nivel de azcar en la sangre durante el ayuno.  Si tiene un peso normal y un bajo riesgo de padecer diabetes, realcese este anlisis cada tres aos despus de los 45aos.  Si tiene sobrepeso y un alto riesgo de padecer diabetes, considere someterse a este anlisis antes o con mayor frecuencia. PREVENCIN DE INFECCIONES HepatitisB   Si tiene un riesgo ms alto de contraer hepatitis B, debe someterse a un examen de deteccin de este virus. Se considera que tiene un alto riesgo de contraer hepatitis B si:  Naci en un pas donde la hepatitis B es frecuente. Pregntele a su mdico qu pases son considerados de alto riesgo.  Sus padres nacieron en un pas de alto riesgo y usted no recibi una vacuna que lo proteja contra la hepatitis B (vacuna contra la hepatitis B).  Tiene VIH o sida.  Usa agujas  para inyectarse drogas.  Vive con alguien que tiene hepatitis B.  Ha tenido sexo con alguien que tiene hepatitis B.  Recibe tratamiento de hemodilisis.  Toma ciertos medicamentos para el cncer, trasplante de rganos y afecciones autoinmunitarias. Hepatitis C   Se recomienda un anlisis de sangre para:  Todos los que nacieron entre 1945 y 1965.  Todas las personas que tengan un riesgo de haber contrado hepatitis C. Enfermedades de transmisin sexual (ETS).   Debe realizarse pruebas de deteccin de enfermedades de transmisin sexual (ETS), incluidas gonorrea y clamidia si:  Es sexualmente activo y es menor de 24aos.  Es mayor de 24aos, y el mdico le informa que corre riesgo de tener este tipo de infecciones.  La actividad sexual ha cambiado desde que le hicieron la ltima prueba de deteccin y tiene un riesgo   mayor de tener clamidia o gonorrea. Pregntele al mdico si usted tiene riesgo.  Si no tiene el VIH, pero corre riesgo de infectarse por el virus, se recomienda tomar diariamente un medicamento recetado para evitar la infeccin. Esto se conoce como profilaxis previa a la exposicin. Se considera que est en riesgo si:  Es activo sexualmente y no usa preservativos habitualmente o no conoce el estado del VIH de sus parejas sexuales.  Se inyecta drogas.  Es activo sexualmente con una pareja que tiene VIH. Consulte a su mdico para saber si tiene un alto riesgo de infectarse por el VIH. Si opta por comenzar la profilaxis previa a la exposicin, primero debe realizarse anlisis de deteccin del VIH. Luego, le harn anlisis cada 3meses mientras est tomando los medicamentos para la profilaxis previa a la exposicin. EMBARAZO  Si es premenopusica y puede quedar embarazada, solicite a su mdico asesoramiento previo a la concepcin.  Si puede quedar embarazada, tome 400 a 800microgramos (mcg) de cido flico todos los das.  Si desea evitar el embarazo, hable con su  mdico sobre el control de la natalidad (anticoncepcin). OSTEOPOROSIS Y MENOPAUSIA  La osteoporosis es una enfermedad en la que los huesos pierden los minerales y la fuerza por el avance de la edad. El resultado pueden ser fracturas graves en los huesos. El riesgo de osteoporosis puede identificarse con una prueba de densidad sea.  Si tiene 65aos o ms, o si est en riesgo de sufrir osteoporosis y fracturas, pregunte a su mdico si debe someterse a exmenes.  Consulte a su mdico si debe tomar un suplemento de calcio o de vitamina D para reducir el riesgo de osteoporosis.  La menopausia puede presentar ciertos sntomas fsicos y riesgos.  La terapia de reemplazo hormonal puede reducir algunos de estos sntomas y riesgos. Consulte a su mdico para saber si la terapia de reemplazo hormonal es conveniente para usted. INSTRUCCIONES PARA EL CUIDADO EN EL HOGAR  Realcese los estudios de rutina de la salud, dentales y de la vista.  Mantngase al da con las vacunas.  No consuma ningn producto que contenga tabaco, lo que incluye cigarrillos, tabaco de mascar o cigarrillos electrnicos.  Si est embarazada, no beba alcohol.  Si est amamantando, reduzca el consumo de alcohol y la frecuencia con la que consume.  Si es mujer y no est embarazada limite el consumo de alcohol a no ms de 1 medida por da. Una medida equivale a 12onzas de cerveza, 5onzas de vino o 1onzas de bebidas alcohlicas de alta graduacin.  No consuma drogas.  No comparta agujas.  Solicite ayuda a su mdico si necesita apoyo o informacin para abandonar las drogas.  Informe a su mdico si a menudo se siente deprimido.  Notifique a su mdico si alguna vez ha sido vctima de abuso o si no se siente seguro en su hogar. Esta informacin no tiene como fin reemplazar el consejo del mdico. Asegrese de hacerle al mdico cualquier pregunta que tenga. Document Released: 01/24/2011 Document Revised: 02/25/2014 Document  Reviewed: 11/08/2014 Elsevier Interactive Patient Education  2017 [employer]  

## 2016-04-18 NOTE — Progress Notes (Signed)
Subjective:    Patient ID: Gabriela Watkins, female    DOB: 12/01/1965, 51 y.o.   MRN: [id number]  Pt presents to the office today for chronic follow up. Diabetes  She presents for her follow-up diabetic visit. She has type 2 diabetes mellitus. Her disease course has been stable. There are no hypoglycemic associated symptoms. Pertinent negatives for hypoglycemia include no dizziness or headaches. Pertinent negatives for diabetes include no chest pain, no foot paresthesias, no foot ulcerations and no visual change. There are no hypoglycemic complications. Pertinent negatives for diabetic complications include no CVA, heart disease or peripheral neuropathy. Risk factors for coronary artery disease include diabetes mellitus, dyslipidemia, hypertension and sedentary lifestyle. Current diabetic treatment includes oral agent (monotherapy). She is compliant with treatment all of the time. She is following a generally unhealthy diet. She rarely participates in exercise. Her breakfast blood glucose range is generally 110-130 mg/dl. An ACE inhibitor/angiotensin II receptor blocker is being taken. Eye exam is not current.  Hypertension  This is a chronic problem. The current episode started more than 1 year ago. The problem has been resolved since onset. The problem is controlled. Pertinent negatives include no anxiety, chest pain, headaches, palpitations, peripheral edema or shortness of breath. Risk factors for coronary artery disease include diabetes mellitus, dyslipidemia, obesity and sedentary lifestyle. Past treatments include ACE inhibitors and diuretics. The current treatment provides significant improvement. Compliance problems include exercise.  There is no history of kidney disease, CAD/MI, CVA or heart failure. There is no history of sleep apnea or a thyroid problem.  Hyperlipidemia  This is a chronic problem. The current episode started more than 1 year ago. The problem is uncontrolled. Recent lipid tests  were reviewed and are high. Exacerbating diseases include diabetes and obesity. She has no history of hypothyroidism. Factors aggravating her hyperlipidemia include fatty foods. Pertinent negatives include no chest pain, leg pain, myalgias or shortness of breath. Current antihyperlipidemic treatment includes statins. The current treatment provides mild improvement of lipids. Risk factors for coronary artery disease include diabetes mellitus, dyslipidemia, hypertension, obesity, post-menopausal and a sedentary lifestyle.      Review of Systems  Constitutional: Negative.   HENT: Negative.   Eyes: Negative.   Respiratory: Negative.  Negative for shortness of breath.   Cardiovascular: Negative.  Negative for chest pain and palpitations.  Gastrointestinal: Negative.   Endocrine: Negative.   Genitourinary: Negative.   Musculoskeletal: Negative.  Negative for myalgias.  Neurological: Negative.  Negative for dizziness and headaches.  Hematological: Negative.   Psychiatric/Behavioral: Negative.   All other systems reviewed and are negative.      Objective:   Physical Exam  Constitutional: She is oriented to person, place, and time. She appears well-developed and well-nourished. No distress.  Morbid obese   HENT:  Head: Normocephalic and atraumatic.  Right Ear: External ear normal.  Left Ear: External ear normal.  Nose: Nose normal.  Mouth/Throat: Oropharynx is clear and moist.  Eyes: Pupils are equal, round, and reactive to light.  Neck: Normal range of motion. Neck supple. No thyromegaly present.  Cardiovascular: Normal rate, regular rhythm, normal heart sounds and intact distal pulses.   No murmur heard. Pulmonary/Chest: Effort normal and breath sounds normal. No respiratory distress. She has no wheezes.  Abdominal: Soft. Bowel sounds are normal. She exhibits no distension. There is no tenderness.  Musculoskeletal: Normal range of motion. She exhibits no edema or tenderness.    Neurological: She is alert and oriented to person, place, and time.  Skin: Skin is warm and dry.  Psychiatric: She has a normal mood and affect. Her behavior is normal. Judgment and thought content normal.  Vitals reviewed.   BP 133/73   Pulse 67   Temp 98.3 F (36.8 C) (Oral)   Ht '5\' 2"'$  (1.575 m)   Wt 257 lb 6.4 oz (116.8 kg)   BMI 47.08 kg/m       Assessment & Plan:  1. Essential hypertension, benign - CMP14+EGFR  2. Type 2 diabetes mellitus with hyperglycemia, without long-term current use of insulin ([facility]) - CMP14+EGFR - Microalbumin / creatinine urine ratio - Bayer [place] Hb A1c Waived  3. Mixed hyperlipidemia  - CMP14+EGFR - Lipid panel  4. Obesity, morbid ([place]) - CMP14+EGFR  5. Vitamin D deficiency - CMP14+EGFR   Continue all meds Labs pending Health Maintenance reviewed- Mammogram scheduled for May, and next appt pt will get Pap Diet and exercise encouraged RTO 4 months  Evelina Dun, FNP

## 2016-04-22 ENCOUNTER — Other Ambulatory Visit: Payer: Self-pay | Admitting: Family

## 2016-04-22 MED ORDER — METFORMIN HCL ER 500 MG PO TB24: ORAL_TABLET | ORAL | 0 refills | Status: DC

## 2016-04-22 MED ORDER — LISINOPRIL-HYDROCHLOROTHIAZIDE 20-12.5 MG PO TABS: 1.0000 | ORAL_TABLET | Freq: Every day | ORAL | 0 refills | Status: DC

## 2016-04-22 NOTE — Addendum Note (Signed)
Addended by: Lorelee CoverOSTOSKY, Rayola Everhart C on: 04/22/2016 11:40 AM   Modules accepted: Orders

## 2016-05-09 DIAGNOSIS — K432 Incisional hernia without obstruction or gangrene: Secondary | ICD-10-CM | POA: Diagnosis not present

## 2016-06-28 DIAGNOSIS — Z1231 Encounter for screening mammogram for malignant neoplasm of breast: Secondary | ICD-10-CM | POA: Diagnosis not present

## 2016-07-28 ENCOUNTER — Other Ambulatory Visit: Payer: Self-pay | Admitting: Family

## 2016-08-08 DIAGNOSIS — K432 Incisional hernia without obstruction or gangrene: Secondary | ICD-10-CM | POA: Diagnosis not present

## 2016-08-22 ENCOUNTER — Ambulatory Visit (INDEPENDENT_AMBULATORY_CARE_PROVIDER_SITE_OTHER): Payer: BLUE CROSS/BLUE SHIELD | Admitting: Family

## 2016-08-22 ENCOUNTER — Encounter: Payer: Self-pay | Admitting: Family

## 2016-08-22 VITALS — BP 116/64 | HR 63 | Temp 98.5°F | Ht 62.0 in | Wt 237.6 lb

## 2016-08-22 DIAGNOSIS — E782 Mixed hyperlipidemia: Secondary | ICD-10-CM

## 2016-08-22 DIAGNOSIS — I10 Essential (primary) hypertension: Secondary | ICD-10-CM | POA: Diagnosis not present

## 2016-08-22 DIAGNOSIS — E1165 Type 2 diabetes mellitus with hyperglycemia: Secondary | ICD-10-CM

## 2016-08-22 DIAGNOSIS — E559 Vitamin D deficiency, unspecified: Secondary | ICD-10-CM | POA: Diagnosis not present

## 2016-08-22 MED ORDER — GLUCOSE BLOOD VI STRP: 1.0000 | ORAL_STRIP | 3 refills | Status: DC | PRN

## 2016-08-22 NOTE — Progress Notes (Signed)
Subjective:    Patient ID: Gabriela Watkins, female    DOB: 23-Nov-1965, 51 y.o.   MRN: [id number]  Pt presents to the office today for chronic follow up.  Diabetes  She presents for her follow-up diabetic visit. She has type 2 diabetes mellitus. There are no hypoglycemic associated symptoms. There are no diabetic associated symptoms. Pertinent negatives for diabetes include no blurred vision, no foot paresthesias and no visual change. There are no hypoglycemic complications. Symptoms are stable. There are no diabetic complications. Pertinent negatives for diabetic complications include no CVA, heart disease or peripheral neuropathy. Risk factors for coronary artery disease include dyslipidemia, diabetes mellitus, hypertension, obesity and post-menopausal. Her breakfast blood glucose range is generally 110-130 mg/dl. An ACE inhibitor/angiotensin II receptor blocker is being taken.  Hypertension  This is a chronic problem. The current episode started more than 1 year ago. The problem has been resolved since onset. The problem is controlled. Pertinent negatives include no blurred vision, peripheral edema or shortness of breath. Risk factors for coronary artery disease include diabetes mellitus, dyslipidemia, obesity and post-menopausal state. The current treatment provides moderate improvement. There is no history of kidney disease, CAD/MI, CVA or heart failure.  Hyperlipidemia  This is a chronic problem. The current episode started more than 1 year ago. The problem is uncontrolled. Recent lipid tests were reviewed and are high. Exacerbating diseases include obesity. Pertinent negatives include no shortness of breath. Current antihyperlipidemic treatment includes statins. The current treatment provides moderate improvement of lipids. Risk factors for coronary artery disease include diabetes mellitus, dyslipidemia, hypertension, post-menopausal and obesity.      Review of Systems  Eyes: Negative for blurred  vision.  Respiratory: Negative for shortness of breath.   All other systems reviewed and are negative.      Objective:   Physical Exam  Constitutional: She is oriented to person, place, and time. She appears well-developed and well-nourished. No distress.  HENT:  Head: Normocephalic and atraumatic.  Right Ear: External ear normal.  Left Ear: External ear normal.  Nose: Nose normal.  Mouth/Throat: Oropharynx is clear and moist.  Eyes: Pupils are equal, round, and reactive to light.  Neck: Normal range of motion. Neck supple. No thyromegaly present.  Cardiovascular: Normal rate, regular rhythm, normal heart sounds and intact distal pulses.   No murmur heard. Pulmonary/Chest: Effort normal and breath sounds normal. No respiratory distress. She has no wheezes.  Abdominal: Soft. Bowel sounds are normal. She exhibits no distension. There is no tenderness.  Musculoskeletal: Normal range of motion. She exhibits no edema or tenderness.  Neurological: She is alert and oriented to person, place, and time.  Skin: Skin is warm and dry.  Psychiatric: She has a normal mood and affect. Her behavior is normal. Judgment and thought content normal.  Vitals reviewed.   Diabetic Foot Exam - Simple   Simple Foot Form Diabetic Foot exam was performed with the following findings:  Yes 08/22/2016 10:01 AM  Visual Inspection No deformities, no ulcerations, no other skin breakdown bilaterally:  Yes Sensation Testing Intact to touch and monofilament testing bilaterally:  Yes Pulse Check Posterior Tibialis and Dorsalis pulse intact bilaterally:  Yes Comments      BP 116/64   Pulse 63   Temp 98.5 F (36.9 C) (Oral)   Ht '5\' 2"'$  (1.575 m)   Wt 237 lb 9.6 oz (107.8 kg)   BMI 43.46 kg/m      Assessment & Plan:  1. Essential hypertension, benign - CMP14+EGFR  2. Type 2 diabetes mellitus with hyperglycemia, without long-term current use of insulin (HCC) - glucose blood test strip; 1 each by Other  route as needed for other. Use as instructed  Dispense: 100 each; Refill: 3 - Bayer DCA Hb A1c Waived - CMP14+EGFR  3. Mixed hyperlipidemia - CMP14+EGFR - Lipid panel  4. Vitamin D deficiency - CMP14+EGFR - VITAMIN D 25 Hydroxy (Vit-D Deficiency, Fractures)  5. Obesity, morbid ([place]) - CMP14+EGFR   Continue all meds Labs pending Health Maintenance reviewed Diet and exercise encouraged RTO 4 months   Evelina Dun, FNP

## 2016-08-22 NOTE — Patient Instructions (Signed)
[place] (Health Maintenance, Female) Un estilo de vida saludable y los cuidados preventivos pueden favorecer considerablemente a la salud y Musician. Pregunte a su mdico cul es el cronograma de exmenes peridicos apropiado para usted. Esta es una buena oportunidad para consultarlo sobre cmo prevenir enfermedades y [place] sano. Adems de los controles, hay muchas otras cosas que puede hacer usted mismo. Los expertos han realizado numerosas investigaciones [name] cambios en el estilo de vida y las medidas de prevencin que, [place], lo ayudarn a mantenerse sano. Solicite a su mdico ms informacin. EL PESO Y LA DIETA Consuma una dieta saludable.  Asegrese de [name] verduras, frutas, productos lcteos de bajo contenido de [name] y [name].  No consuma muchos alimentos de alto contenido de grasas slidas, azcares agregados o sal.  Realice actividad fsica con regularidad. Esta es una de las prcticas ms importantes que puede hacer por su salud. ? La Delorise Shiner de los adultos deben hacer ejercicio durante al menos 124mnutos por semana. El ejercicio debe aumentar la frecuencia cardaca y pActorla transpiracin (ejercicio de [place]. ? La mayora de los adultos tambin deben hacer ejercicios de elongacin al mToysRusveces a la semana. Agregue esto al su plan de ejercicio de intensidad moderada. Mantenga un peso saludable.  El ndice de masa corporal ([facility] es una medida que puede utilizarse para identificar posibles problemas de pEast Uniontown Proporciona una estimacin de la grasa corporal basndose en el peso y la altura. Su mdico puede ayudarle a dRadiation protection practitionerISouth Endy a lScientist, forensico mTheatre managerun peso saludable.  Para las mujeres de 20aos o ms: ? Un IJohn R. Oishei Children'S Hospitalmenor de 18,5 se considera bajo peso. ? Un ICumberland County Hospitalentre 18,5 y 24,9 es normal. ? Un IPelham Medical Centerentre 25 y 29,9 se considera sobrepeso. ? Un IMC de 30 o ms se considera  obesidad. Observe los niveles de colesterol y lpidos en la sangre.  Debe comenzar a rEnglish as a second language teacherde lpidos y cResearch officer, trade unionen la sangre a los 20aos y luego repetirlos cada 516aos  Es posible que nAutomotive engineerlos niveles de colesterol con mayor frecuencia si: ? Sus niveles de lpidos y colesterol son altos. ? Es mayor de 527CWC ? Presenta un alto riesgo de padecer enfermedades cardacas. DETECCIN DE CNCER Cncer de pulmn  Se recomienda realizar exmenes de deteccin de cncer de pulmn a personas adultas entre 574y 892aos que estn en riesgo de dHorticulturist, commercialde pulmn por sus antecedentes de consumo de tabaco.  Se recomienda una tomografa computarizada de baja dosis de los pulmones todos los aos a las personas que: ? Fuman actualmente. ? Hayan dejado el hbito en algn momento en los ltimos 15aos. ? Hayan fumado durante 30aos un paquete diario. Un paquete-ao equivale a fumar un promedio de un paquete de cigarrillos diario durante un ao.  Los exmenes de deteccin anuales deben continuar hasta que hayan pasado 15aos desde que dej de fumar.  Ya no debern realizarse si tiene un problema de salud que le impida recibir tratamiento para eScience writerde pulmn. Cncer de mama  Practique la autoconciencia de la mama. Esto significa reconocer la apariencia normal de sus mamas y cmo las siente.  Tambin significa realizar autoexmenes regulares de lJohnson & Johnson Informe a su mdico sobre cualquier cambio, sin importar cun pequeo sea.  Si tiene entre 20 y 363aos, un mdico debe realizarle un examen clnico de las mamas como parte del examen regular de s[place] cada 1 a  3aos.  Si tiene 40aos o ms, debe realizarse un examen clnico de las mamas todos los aos. Tambin considere realizarse una radiografa de las mamas (mamografa) todos los aos.  Si tiene antecedentes familiares de cncer de mama, hable con su mdico para someterse a un estudio gentico.  Si  tiene alto riesgo de padecer cncer de mama, hable con su mdico para someterse a una resonancia magntica y una mamografa todos los aos.  La evaluacin del gen del cncer de mama (BRCA) se recomienda a mujeres que tengan familiares con cnceres relacionados con el BRCA. Los cnceres relacionados con el BRCA incluyen los siguientes: ? Mama. ? Ovario. ? Trompas. ? Cnceres de peritoneo.  Los resultados de la evaluacin determinarn la necesidad de asesoramiento gentico y de anlisis de BRCA1 y BRCA2. Cncer de cuello del tero El mdico puede recomendarle que se haga pruebas peridicas de deteccin de cncer de los rganos de la pelvis (ovarios, tero y vagina). Estas pruebas incluyen un examen plvico, que abarca controlar si se produjeron cambios microscpicos en la superficie del cuello del tero (prueba de Papanicolaou). Pueden recomendarle que se haga estas pruebas cada 3aos, a partir de los 21aos.  A las mujeres que tienen entre 30 y 65aos, los mdicos pueden recomendarles que se sometan a exmenes plvicos y pruebas de Papanicolaou cada 3aos, o a la prueba de Papanicolaou y el examen plvico en combinacin con estudios de deteccin del virus del papiloma humano (VPH) cada 5aos. Algunos tipos de VPH aumentan el riesgo de padecer cncer de cuello del tero. La prueba para la deteccin del VPH tambin puede realizarse a mujeres de cualquier edad cuyos resultados de la prueba de Papanicolaou no sean claros.  Es posible que otros mdicos no recomienden exmenes de deteccin a mujeres no embarazadas que se consideran sujetos de bajo riesgo de padecer cncer de pelvis y que no tienen sntomas. Pregntele al mdico si un examen plvico de deteccin es adecuado para usted.  Si ha recibido un tratamiento para el cncer cervical o una enfermedad que podra causar cncer, necesitar realizarse una prueba de Papanicolaou y controles durante al menos 20 aos de concluido el tratamiento. Si no se  ha hecho el Papanicolaou con regularidad, debern volver a evaluarse los factores de riesgo (como tener un nuevo compaero sexual), para determinar si debe realizarse los estudios nuevamente. Algunas mujeres sufren problemas mdicos que aumentan la probabilidad de contraer cncer de cuello del tero. En estos casos, el mdico podr indicar que se realicen controles y pruebas de Papanicolaou con ms frecuencia. Cncer colorrectal  Este tipo de cncer puede detectarse y a menudo prevenirse.  Por lo general, los estudios de rutina se deben comenzar a hacer a partir de los 50 aos y hasta los 75 aos.  Sin embargo, el mdico podr aconsejarle que lo haga antes, si tiene factores de riesgo para el cncer de colon.  Tambin puede recomendarle que use un kit de prueba para hallar sangre oculta en la materia fecal.  Es posible que se use una pequea cmara en el extremo de un tubo para examinar directamente el colon (sigmoidoscopia o colonoscopia) a fin de detectar formas tempranas de cncer colorrectal.  Los exmenes de rutina generalmente comienzan a los 50aos.  El examen directo del colon se debe repetir cada 5 a 10aos hasta los 75aos. Sin embargo, es posible que se realicen exmenes con mayor frecuencia, si se detectan formas tempranas de plipos precancerosos o pequeos bultos. Cncer de piel  Revise la piel   de la cabeza a los pies con regularidad.  Informe a su mdico si aparecen nuevos lunares o los que tiene se modifican, especialmente en su forma y color.  Tambin notifique al mdico si tiene un lunar que es ms grande que el tamao de una goma de lpiz.  Siempre use pantalla solar. Aplique pantalla solar de manera libre y repetida a lo largo del da.  Protjase usando mangas y pantalones largos, un sombrero de ala ancha y gafas para el sol, siempre que se encuentre en el exterior. ENFERMEDADES CARDACAS, DIABETES E HIPERTENSIN ARTERIAL  La hipertensin arterial causa  enfermedades cardacas y aumenta el riesgo de ictus. La hipertensin arterial es ms probable en los siguientes casos: ? Las personas que tienen la presin arterial en el extremo del rango normal (100-139/85-89 mm Hg). ? Las personas con sobrepeso u obesidad. ? Las personas afroamericanas.  Si usted tiene entre 18 y 39 aos, debe medirse la presin arterial cada 3 a 5 aos. Si usted tiene 40 aos o ms, debe medirse la presin arterial todos los aos. Debe medirse la presin arterial dos veces: una vez cuando est en un hospital o una clnica y la otra vez cuando est en otro sitio. Registre el promedio de las dos mediciones. Para controlar su presin arterial cuando no est en un hospital o una clnica, puede usar lo siguiente: ? Una mquina automtica para medir la presin arterial en una farmacia. ? Un monitor para medir la presin arterial en el hogar.  Si tiene entre 55 y 79 aos, consulte a su mdico si debe tomar aspirina para prevenir el ictus.  Realcese exmenes de deteccin de la diabetes con regularidad. Esto incluye la toma de una muestra de sangre para controlar el nivel de azcar en la sangre durante el ayuno. ? Si tiene un peso normal y un bajo riesgo de padecer diabetes, realcese este anlisis cada tres aos despus de los 45aos. ? Si tiene sobrepeso y un alto riesgo de padecer diabetes, considere someterse a este anlisis antes o con mayor frecuencia. PREVENCIN DE INFECCIONES HepatitisB  Si tiene un riesgo ms alto de contraer hepatitis B, debe someterse a un examen de deteccin de este virus. Se considera que tiene un alto riesgo de contraer hepatitis B si: ? Naci en un pas donde la hepatitis B es frecuente. Pregntele a su mdico qu pases son considerados de alto riesgo. ? Sus padres nacieron en un pas de alto riesgo y usted no recibi una vacuna que lo proteja contra la hepatitis B (vacuna contra la hepatitis B). ? Tiene VIH o sida. ? Usa agujas para inyectarse  drogas. ? Vive con alguien que tiene hepatitis B. ? Ha tenido sexo con alguien que tiene hepatitis B. ? Recibe tratamiento de hemodilisis. ? Toma ciertos medicamentos para el cncer, trasplante de rganos y afecciones autoinmunitarias. Hepatitis C  Se recomienda un anlisis de sangre para: ? Todos los que nacieron entre 1945 y 1965. ? Todas las personas que tengan un riesgo de haber contrado hepatitis C. Enfermedades de transmisin sexual (ETS).  Debe realizarse pruebas de deteccin de enfermedades de transmisin sexual (ETS), incluidas gonorrea y clamidia si: ? Es sexualmente activo y es menor de 24aos. ? Es mayor de 24aos, y el mdico le informa que corre riesgo de tener este tipo de infecciones. ? La actividad sexual ha cambiado desde que le hicieron la ltima prueba de deteccin y tiene un riesgo mayor de tener clamidia o gonorrea. Pregntele al mdico si usted   tiene riesgo.  Si no tiene el VIH, pero corre riesgo de infectarse por el virus, se recomienda tomar diariamente un medicamento recetado para evitar la infeccin. Esto se conoce como profilaxis previa a la exposicin. Se considera que est en riesgo si: ? Es [place] sexualmente y no [place] preservativos habitualmente o no conoce el estado del VIH de sus Advertising copywriter. ? Se inyecta drogas. ? Es [place] sexualmente con Ardelia Mems pareja que tiene VIH. Consulte a su mdico para saber si tiene un alto riesgo de infectarse por el VIH. Si opta por comenzar la profilaxis previa a la exposicin, primero debe realizarse anlisis de deteccin del VIH. Luego, le harn anlisis cada 34mses mientras est tomando los medicamentos para la profilaxis previa a la exposicin. [facility] Si es premenopusica y puede quedar e[place] solicite a su mdico asesoramiento previo a la concepcin.  Si puede quedar embarazada, tome 400 a [id number]([id number]) de cido fAnheuser-Busch  Si desea evitar el embarazo, hable con su mdico sobre el  control de la natalidad (anticoncepcin). OSTEOPOROSIS Y MENOPAUSIA  La osteoporosis es una enfermedad en la que los huesos pierden los minerales y la fuerza por el avance de la edad. El resultado pueden ser fracturas graves en los hSay[place] El riesgo de osteoporosis puede identificarse con uArdelia Memsprueba de densidad sea.  Si tiene 65aos o ms, o si est en riesgo de sufrir osteoporosis y fracturas, pregunte a su mdico si debe someterse a exmenes.  Consulte a su mdico si debe tomar un suplemento de calcio o de vitamina D para reducir el riesgo de osteoporosis.  La menopausia puede presentar ciertos sntomas fsicos y rGaffer  La terapia de reemplazo hormonal puede reducir algunos de estos sntomas y rGaffer Consulte a su mdico para saber si la terapia de reemplazo hormonal es conveniente para usted. INSTRUCCIONES PARA EL CUIDADO EN EL HOGAR  Realcese los estudios de rutina de la salud, dentales y de lPublic librarian  MBath  No consuma ningn producto que contenga tabaco, lo que incluye cigarrillos, tabaco de mHigher education careers advisero cPsychologist, sport and exercise  Si est embarazada, no beba alcohol.  Si est amamantando, reduzca el consumo de alcohol y la frecuencia con la que consume.  Si es mujer y no est embarazada limite el consumo de alcohol a no ms de 1 medida por da. Una medida equivale a 12onzas de cerveza, 5onzas de vino o 1onzas de bebidas alcohlicas de alta graduacin.  No consuma drogas.  No comparta agujas.  Solicite ayuda a su mdico si necesita apoyo o informacin para abandonar las drogas.  Informe a su mdico si a menudo se siente deprimido.  Notifique a su mdico si alguna vez ha sido vctima de abuso o si no se siente seguro en su hogar. Esta informacin no tiene cMarine scientistel consejo del mdico. Asegrese de hacerle al mdico cualquier pregunta que tenga. Document Released: 01/24/2011 Document Revised: 02/25/2014 Document Reviewed:  11/08/2014 Elsevier Interactive Patient Education  2[employer]

## 2016-09-26 ENCOUNTER — Encounter: Payer: BLUE CROSS/BLUE SHIELD | Admitting: Family

## 2016-09-27 ENCOUNTER — Encounter: Payer: Self-pay | Admitting: Family

## 2016-10-17 ENCOUNTER — Ambulatory Visit (INDEPENDENT_AMBULATORY_CARE_PROVIDER_SITE_OTHER): Payer: BLUE CROSS/BLUE SHIELD | Admitting: Family

## 2016-10-17 ENCOUNTER — Encounter: Payer: Self-pay | Admitting: Family

## 2016-10-17 VITALS — BP 123/74 | HR 61 | Temp 97.4°F | Ht 62.0 in | Wt 225.0 lb

## 2016-10-17 DIAGNOSIS — E782 Mixed hyperlipidemia: Secondary | ICD-10-CM | POA: Diagnosis not present

## 2016-10-17 DIAGNOSIS — E559 Vitamin D deficiency, unspecified: Secondary | ICD-10-CM | POA: Diagnosis not present

## 2016-10-17 DIAGNOSIS — Z01419 Encounter for gynecological examination (general) (routine) without abnormal findings: Secondary | ICD-10-CM

## 2016-10-17 DIAGNOSIS — Z0000 Encounter for general adult medical examination without abnormal findings: Secondary | ICD-10-CM

## 2016-10-17 DIAGNOSIS — E1165 Type 2 diabetes mellitus with hyperglycemia: Secondary | ICD-10-CM

## 2016-10-17 DIAGNOSIS — I10 Essential (primary) hypertension: Secondary | ICD-10-CM | POA: Diagnosis not present

## 2016-10-17 DIAGNOSIS — D649 Anemia, unspecified: Secondary | ICD-10-CM

## 2016-10-17 LAB — BAYER DCA HB A1C WAIVED: HB A1C (BAYER DCA - WAIVED): 6 % (ref <7.0)

## 2016-10-17 NOTE — Patient Instructions (Addendum)
[place] (Health Maintenance, Female) Un estilo de vida saludable y los cuidados preventivos pueden favorecer considerablemente a la salud y Musician. Pregunte a su mdico cul es el cronograma de exmenes peridicos apropiado para usted. Esta es una buena oportunidad para consultarlo sobre cmo prevenir enfermedades y [place] sano. Adems de los controles, hay muchas otras cosas que puede hacer usted mismo. Los expertos han realizado numerosas investigaciones [name] cambios en el estilo de vida y las medidas de prevencin que, [place], lo ayudarn a mantenerse sano. Solicite a su mdico ms informacin. EL PESO Y LA DIETA Consuma una dieta saludable.  Asegrese de [name] verduras, frutas, productos lcteos de bajo contenido de [name] y [name].  No consuma muchos alimentos de alto contenido de grasas slidas, azcares agregados o sal.  Realice actividad fsica con regularidad. Esta es una de las prcticas ms importantes que puede hacer por su salud. ? La Delorise Shiner de los adultos deben hacer ejercicio durante al menos 124mnutos por semana. El ejercicio debe aumentar la frecuencia cardaca y pActorla transpiracin (ejercicio de [place]. ? La mayora de los adultos tambin deben hacer ejercicios de elongacin al mToysRusveces a la semana. Agregue esto al su plan de ejercicio de intensidad moderada. Mantenga un peso saludable.  El ndice de masa corporal ([facility] es una medida que puede utilizarse para identificar posibles problemas de pEast Uniontown Proporciona una estimacin de la grasa corporal basndose en el peso y la altura. Su mdico puede ayudarle a dRadiation protection practitionerISouth Endy a lScientist, forensico mTheatre managerun peso saludable.  Para las mujeres de 20aos o ms: ? Un IJohn R. Oishei Children'S Hospitalmenor de 18,5 se considera bajo peso. ? Un ICumberland County Hospitalentre 18,5 y 24,9 es normal. ? Un IPelham Medical Centerentre 25 y 29,9 se considera sobrepeso. ? Un IMC de 30 o ms se considera  obesidad. Observe los niveles de colesterol y lpidos en la sangre.  Debe comenzar a rEnglish as a second language teacherde lpidos y cResearch officer, trade unionen la sangre a los 20aos y luego repetirlos cada 516aos  Es posible que nAutomotive engineerlos niveles de colesterol con mayor frecuencia si: ? Sus niveles de lpidos y colesterol son altos. ? Es mayor de 527CWC ? Presenta un alto riesgo de padecer enfermedades cardacas. DETECCIN DE CNCER Cncer de pulmn  Se recomienda realizar exmenes de deteccin de cncer de pulmn a personas adultas entre 574y 892aos que estn en riesgo de dHorticulturist, commercialde pulmn por sus antecedentes de consumo de tabaco.  Se recomienda una tomografa computarizada de baja dosis de los pulmones todos los aos a las personas que: ? Fuman actualmente. ? Hayan dejado el hbito en algn momento en los ltimos 15aos. ? Hayan fumado durante 30aos un paquete diario. Un paquete-ao equivale a fumar un promedio de un paquete de cigarrillos diario durante un ao.  Los exmenes de deteccin anuales deben continuar hasta que hayan pasado 15aos desde que dej de fumar.  Ya no debern realizarse si tiene un problema de salud que le impida recibir tratamiento para eScience writerde pulmn. Cncer de mama  Practique la autoconciencia de la mama. Esto significa reconocer la apariencia normal de sus mamas y cmo las siente.  Tambin significa realizar autoexmenes regulares de lJohnson & Johnson Informe a su mdico sobre cualquier cambio, sin importar cun pequeo sea.  Si tiene entre 20 y 363aos, un mdico debe realizarle un examen clnico de las mamas como parte del examen regular de s[place] cada 1 a  3aos.  Si tiene 40aos o ms, debe realizarse un examen clnico de las mamas todos los aos. Tambin considere realizarse una radiografa de las mamas (mamografa) todos los aos.  Si tiene antecedentes familiares de cncer de mama, hable con su mdico para someterse a un estudio gentico.  Si  tiene alto riesgo de padecer cncer de mama, hable con su mdico para someterse a una resonancia magntica y una mamografa todos los aos.  La evaluacin del gen del cncer de mama (BRCA) se recomienda a mujeres que tengan familiares con cnceres relacionados con el BRCA. Los cnceres relacionados con el BRCA incluyen los siguientes: ? Mama. ? Ovario. ? Trompas. ? Cnceres de peritoneo.  Los resultados de la evaluacin determinarn la necesidad de asesoramiento gentico y de anlisis de BRCA1 y BRCA2. Cncer de cuello del tero El mdico puede recomendarle que se haga pruebas peridicas de deteccin de cncer de los rganos de la pelvis (ovarios, tero y vagina). Estas pruebas incluyen un examen plvico, que abarca controlar si se produjeron cambios microscpicos en la superficie del cuello del tero (prueba de Papanicolaou). Pueden recomendarle que se haga estas pruebas cada 3aos, a partir de los 21aos.  A las mujeres que tienen entre 30 y 65aos, los mdicos pueden recomendarles que se sometan a exmenes plvicos y pruebas de Papanicolaou cada 3aos, o a la prueba de Papanicolaou y el examen plvico en combinacin con estudios de deteccin del virus del papiloma humano (VPH) cada 5aos. Algunos tipos de VPH aumentan el riesgo de padecer cncer de cuello del tero. La prueba para la deteccin del VPH tambin puede realizarse a mujeres de cualquier edad cuyos resultados de la prueba de Papanicolaou no sean claros.  Es posible que otros mdicos no recomienden exmenes de deteccin a mujeres no embarazadas que se consideran sujetos de bajo riesgo de padecer cncer de pelvis y que no tienen sntomas. Pregntele al mdico si un examen plvico de deteccin es adecuado para usted.  Si ha recibido un tratamiento para el cncer cervical o una enfermedad que podra causar cncer, necesitar realizarse una prueba de Papanicolaou y controles durante al menos 20 aos de concluido el tratamiento. Si no se  ha hecho el Papanicolaou con regularidad, debern volver a evaluarse los factores de riesgo (como tener un nuevo compaero sexual), para determinar si debe realizarse los estudios nuevamente. Algunas mujeres sufren problemas mdicos que aumentan la probabilidad de contraer cncer de cuello del tero. En estos casos, el mdico podr indicar que se realicen controles y pruebas de Papanicolaou con ms frecuencia. Cncer colorrectal  Este tipo de cncer puede detectarse y a menudo prevenirse.  Por lo general, los estudios de rutina se deben comenzar a hacer a partir de los 50 aos y hasta los 75 aos.  Sin embargo, el mdico podr aconsejarle que lo haga antes, si tiene factores de riesgo para el cncer de colon.  Tambin puede recomendarle que use un kit de prueba para hallar sangre oculta en la materia fecal.  Es posible que se use una pequea cmara en el extremo de un tubo para examinar directamente el colon (sigmoidoscopia o colonoscopia) a fin de detectar formas tempranas de cncer colorrectal.  Los exmenes de rutina generalmente comienzan a los 50aos.  El examen directo del colon se debe repetir cada 5 a 10aos hasta los 75aos. Sin embargo, es posible que se realicen exmenes con mayor frecuencia, si se detectan formas tempranas de plipos precancerosos o pequeos bultos. Cncer de piel  Revise la piel   de la cabeza a los pies con regularidad.  Informe a su mdico si aparecen nuevos lunares o los que tiene se modifican, especialmente en su forma y color.  Tambin notifique al mdico si tiene un lunar que es ms grande que el tamao de una goma de lpiz.  Siempre use pantalla solar. Aplique pantalla solar de manera libre y repetida a lo largo del da.  Protjase usando mangas y pantalones largos, un sombrero de ala ancha y gafas para el sol, siempre que se encuentre en el exterior. ENFERMEDADES CARDACAS, DIABETES E HIPERTENSIN ARTERIAL  La hipertensin arterial causa  enfermedades cardacas y aumenta el riesgo de ictus. La hipertensin arterial es ms probable en los siguientes casos: ? Las personas que tienen la presin arterial en el extremo del rango normal (100-139/85-89 mm Hg). ? Las personas con sobrepeso u obesidad. ? Las personas afroamericanas.  Si usted tiene entre 18 y 39 aos, debe medirse la presin arterial cada 3 a 5 aos. Si usted tiene 40 aos o ms, debe medirse la presin arterial todos los aos. Debe medirse la presin arterial dos veces: una vez cuando est en un hospital o una clnica y la otra vez cuando est en otro sitio. Registre el promedio de las dos mediciones. Para controlar su presin arterial cuando no est en un hospital o una clnica, puede usar lo siguiente: ? Una mquina automtica para medir la presin arterial en una farmacia. ? Un monitor para medir la presin arterial en el hogar.  Si tiene entre 55 y 79 aos, consulte a su mdico si debe tomar aspirina para prevenir el ictus.  Realcese exmenes de deteccin de la diabetes con regularidad. Esto incluye la toma de una muestra de sangre para controlar el nivel de azcar en la sangre durante el ayuno. ? Si tiene un peso normal y un bajo riesgo de padecer diabetes, realcese este anlisis cada tres aos despus de los 45aos. ? Si tiene sobrepeso y un alto riesgo de padecer diabetes, considere someterse a este anlisis antes o con mayor frecuencia. PREVENCIN DE INFECCIONES HepatitisB  Si tiene un riesgo ms alto de contraer hepatitis B, debe someterse a un examen de deteccin de este virus. Se considera que tiene un alto riesgo de contraer hepatitis B si: ? Naci en un pas donde la hepatitis B es frecuente. Pregntele a su mdico qu pases son considerados de alto riesgo. ? Sus padres nacieron en un pas de alto riesgo y usted no recibi una vacuna que lo proteja contra la hepatitis B (vacuna contra la hepatitis B). ? Tiene VIH o sida. ? Usa agujas para inyectarse  drogas. ? Vive con alguien que tiene hepatitis B. ? Ha tenido sexo con alguien que tiene hepatitis B. ? Recibe tratamiento de hemodilisis. ? Toma ciertos medicamentos para el cncer, trasplante de rganos y afecciones autoinmunitarias. Hepatitis C  Se recomienda un anlisis de sangre para: ? Todos los que nacieron entre 1945 y 1965. ? Todas las personas que tengan un riesgo de haber contrado hepatitis C. Enfermedades de transmisin sexual (ETS).  Debe realizarse pruebas de deteccin de enfermedades de transmisin sexual (ETS), incluidas gonorrea y clamidia si: ? Es sexualmente activo y es menor de 24aos. ? Es mayor de 24aos, y el mdico le informa que corre riesgo de tener este tipo de infecciones. ? La actividad sexual ha cambiado desde que le hicieron la ltima prueba de deteccin y tiene un riesgo mayor de tener clamidia o gonorrea. Pregntele al mdico si usted   tiene riesgo.  Si no tiene el VIH, pero corre riesgo de infectarse por el virus, se recomienda tomar diariamente un medicamento recetado para evitar la infeccin. Esto se conoce como profilaxis previa a la exposicin. Se considera que est en riesgo si: ? Es [place] sexualmente y no [place] preservativos habitualmente o no conoce el estado del VIH de sus Advertising copywriter. ? Se inyecta drogas. ? Es [place] sexualmente con Ardelia Mems pareja que tiene VIH. Consulte a su mdico para saber si tiene un alto riesgo de infectarse por el VIH. Si opta por comenzar la profilaxis previa a la exposicin, primero debe realizarse anlisis de deteccin del VIH. Luego, le harn anlisis cada 34mses mientras est tomando los medicamentos para la profilaxis previa a la exposicin. [facility] Si es premenopusica y puede quedar e[place] solicite a su mdico asesoramiento previo a la concepcin.  Si puede quedar embarazada, tome 400 a [id number]([id number]) de cido fAnheuser-Busch  Si desea evitar el embarazo, hable con su mdico sobre el  control de la natalidad (anticoncepcin). OSTEOPOROSIS Y MENOPAUSIA  La osteoporosis es una enfermedad en la que los huesos pierden los minerales y la fuerza por el avance de la edad. El resultado pueden ser fracturas graves en los hSay[place] El riesgo de osteoporosis puede identificarse con uArdelia Memsprueba de densidad sea.  Si tiene 65aos o ms, o si est en riesgo de sufrir osteoporosis y fracturas, pregunte a su mdico si debe someterse a exmenes.  Consulte a su mdico si debe tomar un suplemento de calcio o de vitamina D para reducir el riesgo de osteoporosis.  La menopausia puede presentar ciertos sntomas fsicos y rGaffer  La terapia de reemplazo hormonal puede reducir algunos de estos sntomas y rGaffer Consulte a su mdico para saber si la terapia de reemplazo hormonal es conveniente para usted. INSTRUCCIONES PARA EL CUIDADO EN EL HOGAR  Realcese los estudios de rutina de la salud, dentales y de lPublic librarian  MBath  No consuma ningn producto que contenga tabaco, lo que incluye cigarrillos, tabaco de mHigher education careers advisero cPsychologist, sport and exercise  Si est embarazada, no beba alcohol.  Si est amamantando, reduzca el consumo de alcohol y la frecuencia con la que consume.  Si es mujer y no est embarazada limite el consumo de alcohol a no ms de 1 medida por da. Una medida equivale a 12onzas de cerveza, 5onzas de vino o 1onzas de bebidas alcohlicas de alta graduacin.  No consuma drogas.  No comparta agujas.  Solicite ayuda a su mdico si necesita apoyo o informacin para abandonar las drogas.  Informe a su mdico si a menudo se siente deprimido.  Notifique a su mdico si alguna vez ha sido vctima de abuso o si no se siente seguro en su hogar. Esta informacin no tiene cMarine scientistel consejo del mdico. Asegrese de hacerle al mdico cualquier pregunta que tenga. Document Released: 01/24/2011 Document Revised: 02/25/2014 Document Reviewed:  11/08/2014 Elsevier Interactive Patient Education  2[employer]

## 2016-10-17 NOTE — Progress Notes (Signed)
Subjective:    Patient ID: Gabriela Watkins, female    DOB: 1965/05/18, 51 y.o.   MRN: [id number]  PT presents to the office today for CPE with pap.  Diabetes  She presents for her follow-up diabetic visit. She has type 2 diabetes mellitus. Her disease course has been stable. There are no hypoglycemic associated symptoms. Pertinent negatives for hypoglycemia include no confusion or sleepiness. There are no diabetic associated symptoms. Pertinent negatives for diabetes include no blurred vision, no foot paresthesias and no visual change. Symptoms are stable. Pertinent negatives for diabetic complications include no CVA. Risk factors for coronary artery disease include dyslipidemia, obesity, post-menopausal and sedentary lifestyle. Her breakfast blood glucose range is generally 110-130 mg/dl. Eye exam is current.  Hypertension  This is a chronic problem. The current episode started more than 1 year ago. The problem has been resolved since onset. The problem is controlled. Pertinent negatives include no blurred vision, malaise/fatigue, peripheral edema or shortness of breath. Risk factors for coronary artery disease include dyslipidemia, diabetes mellitus, obesity and sedentary lifestyle. The current treatment provides moderate improvement. There is no history of kidney disease, CAD/MI, CVA or heart failure.  Hyperlipidemia  This is a chronic problem. The current episode started more than 1 year ago. The problem is uncontrolled. Recent lipid tests were reviewed and are high. Exacerbating diseases include obesity. Pertinent negatives include no shortness of breath. Current antihyperlipidemic treatment includes statins. The current treatment provides moderate improvement of lipids.  Gynecologic Exam  The patient's pertinent negatives include no genital lesions, genital odor, vaginal bleeding or vaginal discharge. The patient is experiencing no pain.      Review of Systems  Constitutional: Negative for  malaise/fatigue.  Eyes: Negative for blurred vision.  Respiratory: Negative for shortness of breath.   Genitourinary: Negative for vaginal discharge.  Psychiatric/Behavioral: Negative for confusion.  All other systems reviewed and are negative.      Objective:   Physical Exam  Constitutional: She is oriented to person, place, and time. She appears well-developed and well-nourished. No distress.  Morbid obese  HENT:  Head: Normocephalic and atraumatic.  Right Ear: External ear normal.  Left Ear: External ear normal.  Nose: Nose normal.  Mouth/Throat: Oropharynx is clear and moist.  Eyes: Pupils are equal, round, and reactive to light.  Neck: Normal range of motion. Neck supple. No thyromegaly present.  Cardiovascular: Normal rate, regular rhythm, normal heart sounds and intact distal pulses.   No murmur heard. Pulmonary/Chest: Effort normal and breath sounds normal. No respiratory distress. She has no wheezes. Right breast exhibits no inverted nipple, no mass, no nipple discharge, no skin change and no tenderness. Left breast exhibits no inverted nipple, no mass, no nipple discharge, no skin change and no tenderness. Breasts are symmetrical.  Abdominal: Soft. Bowel sounds are normal. She exhibits no distension. There is no tenderness.  Genitourinary: Vagina normal.  Genitourinary Comments: Bimanual exam- no adnexal masses or tenderness, ovaries nonpalpable   Cervix parous and pink- No discharge   Musculoskeletal: Normal range of motion. She exhibits no edema or tenderness.  Neurological: She is alert and oriented to person, place, and time. She has normal reflexes. No cranial nerve deficit.  Skin: Skin is warm and dry.  Psychiatric: She has a normal mood and affect. Her behavior is normal. Judgment and thought content normal.  Vitals reviewed.     BP 123/74   Pulse 61   Temp (!) 97.4 F (36.3 C) (Oral)   Ht '5\' 2"'$  (  1.575 m)   Wt 225 lb (102.1 kg)   BMI 41.15 kg/m         Assessment & Plan:  1. Mixed hyperlipidemia - CMP14+EGFR - CBC with Differential/Platelet - Lipid panel  2. Essential hypertension, benign - CMP14+EGFR - CBC with Differential/Platelet  3. Type 2 diabetes mellitus with hyperglycemia, without long-term current use of insulin (HCC) - CMP14+EGFR - CBC with Differential/Platelet - Microalbumin / creatinine urine ratio - Bayer DCA Hb A1c Waived  4. Obesity, morbid ([place]) - CMP14+EGFR - CBC with Differential/Platelet  5. Vitamin D deficiency - CMP14+EGFR - CBC with Differential/Platelet - VITAMIN D 25 Hydroxy (Vit-D Deficiency, Fractures)  6. Annual physical exam - CMP14+EGFR - CBC with Differential/Platelet - Lipid panel - VITAMIN D 25 Hydroxy (Vit-D Deficiency, Fractures) - Microalbumin / creatinine urine ratio - Bayer DCA Hb A1c Waived - TSH - Pap IG w/ reflex to HPV when ASC-U  7. Gynecologic exam normal - CMP14+EGFR - CBC with Differential/Platelet - Pap IG w/ reflex to HPV when ASC-U   Continue all meds Labs pending Health Maintenance reviewed Diet and exercise encouraged RTO 4 months   Evelina Dun, FNP

## 2016-10-18 ENCOUNTER — Other Ambulatory Visit: Payer: Self-pay | Admitting: Family

## 2016-10-18 ENCOUNTER — Other Ambulatory Visit: Payer: BLUE CROSS/BLUE SHIELD

## 2016-10-18 DIAGNOSIS — D649 Anemia, unspecified: Secondary | ICD-10-CM

## 2016-10-18 DIAGNOSIS — E559 Vitamin D deficiency, unspecified: Secondary | ICD-10-CM

## 2016-10-18 LAB — CBC WITH DIFFERENTIAL/PLATELET
BASOS ABS: 0 10*3/uL (ref 0.0–0.2)
Basos: 0 %
EOS (ABSOLUTE): 0.1 10*3/uL (ref 0.0–0.4)
EOS: 1 %
HEMATOCRIT: 30 % — AB (ref 34.0–46.6)
Hemoglobin: 8.9 g/dL — CL (ref 11.1–15.9)
IMMATURE GRANULOCYTES: 0 %
Immature Grans (Abs): 0 10*3/uL (ref 0.0–0.1)
Lymphocytes Absolute: 2.8 10*3/uL (ref 0.7–3.1)
Lymphs: 39 %
MCH: 19.8 pg — ABNORMAL LOW (ref 26.6–33.0)
MCHC: 29.7 g/dL — ABNORMAL LOW (ref 31.5–35.7)
MCV: 67 fL — ABNORMAL LOW (ref 79–97)
Monocytes Absolute: 0.5 10*3/uL (ref 0.1–0.9)
Monocytes: 6 %
NEUTROS PCT: 54 %
Neutrophils Absolute: 3.8 10*3/uL (ref 1.4–7.0)
PLATELETS: 346 10*3/uL (ref 150–379)
RBC: 4.49 x10E6/uL (ref 3.77–5.28)
RDW: 19.2 % — AB (ref 12.3–15.4)
WBC: 7.2 10*3/uL (ref 3.4–10.8)

## 2016-10-18 LAB — CMP14+EGFR
ALK PHOS: 105 IU/L (ref 39–117)
ALT: 14 IU/L (ref 0–32)
AST: 15 IU/L (ref 0–40)
Albumin/Globulin Ratio: 1.5 (ref 1.2–2.2)
Albumin: 4.3 g/dL (ref 3.5–5.5)
BILIRUBIN TOTAL: 0.2 mg/dL (ref 0.0–1.2)
BUN/Creatinine Ratio: 31 — ABNORMAL HIGH (ref 9–23)
BUN: 17 mg/dL (ref 6–24)
CHLORIDE: 107 mmol/L — AB (ref 96–106)
CO2: 22 mmol/L (ref 20–29)
CREATININE: 0.54 mg/dL — AB (ref 0.57–1.00)
Calcium: 9.1 mg/dL (ref 8.7–10.2)
GFR calc Af Amer: 129 mL/min/{1.73_m2} (ref >59)
GFR calc non Af Amer: 112 mL/min/{1.73_m2} (ref >59)
GLOBULIN, TOTAL: 2.8 g/dL (ref 1.5–4.5)
GLUCOSE: 103 mg/dL — AB (ref 65–99)
Potassium: 4.2 mmol/L (ref 3.5–5.2)
SODIUM: 141 mmol/L (ref 134–144)
Total Protein: 7.1 g/dL (ref 6.0–8.5)

## 2016-10-18 LAB — PAP IG W/ RFLX HPV ASCU: PAP Smear Comment: 0

## 2016-10-18 LAB — VITAMIN D 25 HYDROXY (VIT D DEFICIENCY, FRACTURES): VIT D 25 HYDROXY: 27.3 ng/mL — AB (ref 30.0–100.0)

## 2016-10-18 LAB — LIPID PANEL
Chol/HDL Ratio: 5.1 ratio — ABNORMAL HIGH (ref 0.0–4.4)
Cholesterol, Total: 197 mg/dL (ref 100–199)
HDL: 39 mg/dL — AB (ref >39)
LDL Calculated: 133 mg/dL — ABNORMAL HIGH (ref 0–99)
TRIGLYCERIDES: 125 mg/dL (ref 0–149)
VLDL CHOLESTEROL CAL: 25 mg/dL (ref 5–40)

## 2016-10-18 LAB — TSH: TSH: 2.36 u[IU]/mL (ref 0.450–4.500)

## 2016-10-18 LAB — HEMOGLOBIN, FINGERSTICK: Hemoglobin: 8.7 g/dL — ABNORMAL LOW (ref 11.1–15.9)

## 2016-10-18 MED ORDER — VITAMIN D (ERGOCALCIFEROL) 1.25 MG (50000 UNIT) PO CAPS: 50000.0000 [IU] | ORAL_CAPSULE | ORAL | 3 refills | Status: DC

## 2016-10-18 NOTE — Addendum Note (Signed)
Addended by: Tamera PuntWRAY,  S on: 10/18/2016 04:11 PM   Modules accepted: Orders

## 2016-10-18 NOTE — Progress Notes (Signed)
Patient given her lab results through her son who spoke [place]   Will bring back stool cards on Tuesday

## 2016-10-19 ENCOUNTER — Other Ambulatory Visit: Payer: Self-pay | Admitting: Family

## 2016-10-19 DIAGNOSIS — D509 Iron deficiency anemia, unspecified: Secondary | ICD-10-CM

## 2016-10-19 DIAGNOSIS — D649 Anemia, unspecified: Secondary | ICD-10-CM

## 2016-10-19 LAB — ANEMIA PROFILE B
BASOS ABS: 0 10*3/uL (ref 0.0–0.2)
BASOS: 1 %
EOS (ABSOLUTE): 0.1 10*3/uL (ref 0.0–0.4)
EOS: 1 %
FERRITIN: 4 ng/mL — AB (ref 15–150)
FOLATE: 18.3 ng/mL (ref >3.0)
Hematocrit: 28.2 % — ABNORMAL LOW (ref 34.0–46.6)
Hemoglobin: 8.7 g/dL — CL (ref 11.1–15.9)
IRON SATURATION: 4 % — AB (ref 15–55)
IRON: 22 ug/dL — AB (ref 27–159)
Immature Grans (Abs): 0 10*3/uL (ref 0.0–0.1)
Immature Granulocytes: 0 %
LYMPHS ABS: 2.4 10*3/uL (ref 0.7–3.1)
Lymphs: 33 %
MCH: 20.7 pg — AB (ref 26.6–33.0)
MCHC: 30.9 g/dL — ABNORMAL LOW (ref 31.5–35.7)
MCV: 67 fL — AB (ref 79–97)
Monocytes Absolute: 0.4 10*3/uL (ref 0.1–0.9)
Monocytes: 6 %
NEUTROS ABS: 4.3 10*3/uL (ref 1.4–7.0)
Neutrophils: 59 %
PLATELETS: 319 10*3/uL (ref 150–379)
RBC: 4.21 x10E6/uL (ref 3.77–5.28)
RDW: 18.8 % — ABNORMAL HIGH (ref 12.3–15.4)
Retic Ct Pct: 1 % (ref 0.6–2.6)
TIBC: 509 ug/dL — AB (ref 250–450)
UIBC: 487 ug/dL — AB (ref 131–425)
VITAMIN B 12: 370 pg/mL (ref 232–1245)
WBC: 7.2 10*3/uL (ref 3.4–10.8)

## 2016-10-19 LAB — MICROALBUMIN / CREATININE URINE RATIO
Creatinine, Urine: 207.2 mg/dL
MICROALB/CREAT RATIO: 6.3 mg/g{creat} (ref 0.0–30.0)
MICROALBUM., U, RANDOM: 13.1 ug/mL

## 2016-10-30 ENCOUNTER — Other Ambulatory Visit: Payer: Self-pay | Admitting: Family

## 2016-11-14 ENCOUNTER — Encounter (HOSPITAL_COMMUNITY): Payer: BLUE CROSS/BLUE SHIELD | Attending: Oncology

## 2016-11-21 ENCOUNTER — Ambulatory Visit (INDEPENDENT_AMBULATORY_CARE_PROVIDER_SITE_OTHER): Payer: BLUE CROSS/BLUE SHIELD | Admitting: Family

## 2016-11-21 ENCOUNTER — Encounter: Payer: Self-pay | Admitting: Family

## 2016-11-21 VITALS — BP 144/93 | HR 66 | Temp 97.3°F | Ht 62.0 in | Wt 224.0 lb

## 2016-11-21 DIAGNOSIS — J029 Acute pharyngitis, unspecified: Secondary | ICD-10-CM | POA: Diagnosis not present

## 2016-11-21 DIAGNOSIS — J020 Streptococcal pharyngitis: Secondary | ICD-10-CM | POA: Diagnosis not present

## 2016-11-21 DIAGNOSIS — K5900 Constipation, unspecified: Secondary | ICD-10-CM | POA: Diagnosis not present

## 2016-11-21 DIAGNOSIS — D509 Iron deficiency anemia, unspecified: Secondary | ICD-10-CM

## 2016-11-21 LAB — RAPID STREP SCREEN (MED CTR MEBANE ONLY): Strep Gp A Ag, IA W/Reflex: POSITIVE — AB

## 2016-11-21 LAB — CULTURE, GROUP A STREP

## 2016-11-21 MED ORDER — AZITHROMYCIN 250 MG PO TABS: ORAL_TABLET | ORAL | 0 refills | Status: DC

## 2016-11-21 NOTE — Patient Instructions (Signed)
Faringitis estreptoccica (Strep Throat) La faringitis estreptoccica es una infeccin bacteriana que se produce en la garganta. El mdico puede llamarla amigdalitis o faringitis, en funcin de si hay inflamacin de las amgdalas o de la zona posterior de la garganta. La faringitis estreptoccica es ms frecuente durante los meses fros del ao en los nios de 5a 15aos, pero puede ocurrir durante cualquier estacin y en personas de todas las edades. La infeccin se transmite de [place] persona a otra (es contagiosa) a travs de la tos, el estornudo o el contacto directo. CAUSAS La faringitis estreptoccica es causada por la especie de bacterias Streptococcus pyogenes. FACTORES DE RIESGO Es ms probable que esta afeccin se manifieste en:  Las personas que pasan tiempo en lugares en los que hay mucha gente, donde la infeccin se puede diseminar fcilmente.  Las personas que tienen contacto cercano con alguien que padece faringitis estreptoccica. SNTOMAS Los sntomas de esta afeccin incluyen lo siguiente:  Grant Ruts o escalofros.  Enrojecimiento, inflamacin o dolor de las amgdalas o la garganta.  Dolor o dificultad para tragar.  Manchas blancas o amarillas en las amgdalas o la garganta.  Ganglios hinchados o dolorosos con la palpacin en el cuello o debajo de la [place].  Erupcin roja en todo el cuerpo (poco frecuente). DIAGNSTICO Para diagnosticar esta afeccin, se realiza una prueba rpida para estreptococos o un hisopado de la garganta (cultivo de las secreciones de la garganta). Los resultados de la prueba rpida para estreptococos suelen Patent attorney en pocos minutos, [employer] los del cultivo de las secreciones de la garganta tardan uno o [place]. TRATAMIENTO Esta enfermedad se trata con antibiticos. INSTRUCCIONES PARA EL CUIDADO EN EL HOGAR Medicamentos  [employer] de venta libre y los recetados solamente como se lo haya indicado el mdico.  Tome los antibiticos  como se lo haya indicado el mdico. No deje de tomar los antibiticos aunque comience a sentirse mejor.  Haga que los miembros de la familia que tambin tienen dolor de garganta o fiebre se hagan pruebas de deteccin de la faringitis estreptoccica. Tal vez deban toma antibiticos si tienen la enfermedad. Comida y bebida  No comparta alimentos, tazas ni artculos personales que podran contagiar la infeccin a Economist.  Si tiene dificultad para tragar, intente consumir alimentos blandos hasta que el dolor de garganta mejore.  Beba suficiente lquido para Photographer orina clara o de color amarillo plido. Instrucciones generales  Haga grgaras con una mezcla de agua y sal 3 o 4veces al da, o cuando sea necesario. Para preparar la mezcla de agua y sal, disuelva totalmente de media a 1cucharadita de sal en 1taza de agua tibia.  Asegrese de que todas las personas con las que convive se laven [employer].  Descanse lo suficiente.  No concurra a la escuela o al Marisa Cyphers que haya tomado los antibiticos durante 24horas.  Concurra a todas las visitas de control como se lo haya indicado el mdico. Esto es importante. SOLICITE ATENCIN MDICA SI:  Los ganglios del cuello siguen agrandndose.  Aparece una erupcin cutnea, tos o dolor de odos.  Tose y expectora un lquido espeso de color verde o amarillo amarronado, o con [place].  Tiene dolor o molestias que no mejoran con medicamentos.  Los Programmer, applications de Scientist, clinical (histocompatibility and immunogenetics).  Tiene fiebre.  SOLICITE ATENCIN MDICA DE INMEDIATO SI:  Tiene sntomas nuevos, como vmitos, dolor de cabeza intenso, rigidez o dolor en el cuello, dolor en el pecho o falta de [place].  Le duele mucho la garganta, babea o tiene cambios en la visin.  Siente que el cuello se le hincha o que la piel de esa zona se vuelve roja y sensible.  Tiene signos de deshidratacin, como fatiga, boca seca y disminucin de la cantidad [place].  Comienza a sentir mucho sueo, o no logra despertarse por completo.  Las articulaciones estn enrojecidas o le duelen.  Esta informacin no tiene Theme park manager el consejo del mdico. Asegrese de hacerle al mdico cualquier pregunta que tenga. Document Released: 11/14/2004 Document Revised: 10/26/2014 Document Reviewed: 05/30/2014 Elsevier Interactive Patient Education  2017 Elsevier Inc. Anemia por deficiencia de hierro - Adultos (Iron Deficiency Anemia, Adult) La anemia es una afeccin en la que el nivel de glbulos rojos o hemoglobina en la sangre est por debajo del normal. La hemoglobina es la sustancia de los glbulos rojos que transporta el oxgeno. La anemia por deficiencia de hierro es la anemia provocada por un nivel bajo de hierro. Este es el tipo ms comn de anemia y puede provocarle cansancio y dificultad para Industrial/product designer. CAUSAS   Falta de hierro en la dieta.  Absorcin deficiente de hierro, como sucede con los trastornos intestinales.  Hemorragia intestinal.  Perodos abundantes. SIGNOS Y SNTOMAS  Cuando la anemia es leve puede pasar inadvertida. Los sntomas pueden ser:  [place]ma Friendly.  Dolor de [place].  Piel plida.  Debilidad.  Cansancio.  Falta de aire.  Mareos.  Manos y pies fros.  Frecuencia cardaca acelerada o irregular. DIAGNSTICO  El diagnstico requiere una evaluacin y un examen fsico minuciosos por parte del mdico. [place], se realizan anlisis de sangre para confirmar la anemia por deficiencia de hierro. Es posible que se realicen anlisis adicionales para encontrar la causa subyacente de la anemia. Estos pueden ser:  Anlisis para Engineer, manufacturing la presencia de [employer] heces (anlisis de sangre oculta en heces).  Un procedimiento para examinar el interior del colon y el recto (colonoscopia).  Un procedimiento para examinar el interior del esfago y Investment banker, corporate (endoscopia). TRATAMIENTO  La anemia por deficiencia de hierro  se trata al remediar la causa de la deficiencia. El tratamiento incluye:  Agregarle a la dieta alimentos ricos en hierro.  Tomar suplementos de hierro. Las mujeres embarazadas o que estn amamantando necesitan una dosis adicional de hierro, ya que su dieta normal generalmente no proporciona la cantidad necesaria.  Tomar vitaminas. La vitamina C mejora la absorcin del hierro. Es posible que el mdico le recomiende tomar los comprimidos de hierro con un vaso de jugo de naranja o con un suplemento de vitamina C.  Medicamentos para disminuir el flujo menstrual abundante.  [place]. INSTRUCCIONES PARA EL CUIDADO EN EL HOGAR   Tome el hierro segn las indicaciones del mdico. ? Si no puede tragar los suplementos de hierro, hable con su mdico sobre la posibilidad de que se lo coloquen en la vena (va intravenosa) o mediante una inyeccin en el msculo. ? Para lograr una mejor absorcin del hierro, los suplementos se deben tomar con el estmago vaco. Si no los tolera con el estmago vaco, posiblemente deba tomarlos con la comida. ? No beba leche ni tome anticidos al mismo tiempo que los suplementos de hierro, ya que estos pueden interferir en la absorcin del hierro. ? Los suplementos de hierro pueden Investment banker, corporate. Para prevenirlo, asegrese de incluir fibras en su dieta. Posiblemente tambin se recomiende el uso de un ablandador de heces.  Tome las vitaminas segn las indicaciones del mdico.  Melida Quitter  una dieta rica en hierro. Los alimentos con alto contenido de hierro incluyen hgado, carne magra, pan de grano integral, [place], frutas deshidratadas y vegetales de hojas color verde oscuro. SOLICITE ATENCIN MDICA DE INMEDIATO SI:   Se desmaya. Si esto sucede, no conduzca. Comunquese con el servicio de emergencias de su localidad (911 en los [place]) si no dispone de [place].  Siente dolor en el pecho.  Se siente nauseoso o vomita.  Presenta una dificultad respiratoria  grave o en aumento al [employer].  Se siente dbil.  Tiene una frecuencia cardaca acelerada.  Comienza a sudar sin motivo.  Se siente mareado cuando se levanta de una silla o de la cama. ASEGRESE DE QUE:   Comprende estas instrucciones.  Controlar su afeccin.  Recibir ayuda de inmediato si no mejora o si empeora. Esta informacin no tiene Theme park manager el consejo del mdico. Asegrese de hacerle al mdico cualquier pregunta que tenga. Document Released: 02/04/2005 Document Revised: 02/25/2014 Elsevier Interactive Patient Education  2017 [name].

## 2016-11-21 NOTE — Progress Notes (Signed)
   Subjective:    Patient ID: Gabriela Watkins, female    DOB: 1965/03/10, 51 y.o.   MRN: [id number]  Pt presents to the office today to recheck Hg and iron levels. Pt was seen on 10/17/16 and shown to have an Iron Saturation of 4 and Hgb 8.7. Pt went to [place] for one month and has not had the chance to follow up with Hematologists, but has been taking her Iron BID.   Pt states she is having constipation with the iron.  Pt denies any SOB, chest pain, blood in stools, or fatigue.   Sore Throat   This is a new problem. The current episode started in the past 7 days. The problem has been unchanged. There has been no fever. The pain is at a severity of 5/10. The pain is mild. Associated symptoms include trouble swallowing. Pertinent negatives include no coughing, drooling, ear discharge, ear pain or hoarse voice. She has tried acetaminophen for the symptoms. The treatment provided mild relief.  Anemia  Presents for follow-up visit. Symptoms include malaise/fatigue. There has been no bruising/bleeding easily, confusion or leg swelling.     Review of Systems  Constitutional: Positive for malaise/fatigue.  HENT: Positive for trouble swallowing. Negative for drooling, ear discharge, ear pain and hoarse voice.   Respiratory: Negative for cough.   Gastrointestinal: Positive for constipation.  Hematological: Does not bruise/bleed easily.  Psychiatric/Behavioral: Negative for confusion.  All other systems reviewed and are negative.      Objective:   Physical Exam  Constitutional: She is oriented to person, place, and time. She appears well-developed and well-nourished. No distress.  HENT:  Head: Normocephalic and atraumatic.  Right Ear: External ear normal.  Left Ear: External ear normal.  Nose: Nose normal.  Mouth/Throat: Posterior oropharyngeal erythema present.  Eyes: Pupils are equal, round, and reactive to light.  Neck: Normal range of motion. Neck supple. No thyromegaly present.    Cardiovascular: Normal rate, regular rhythm, normal heart sounds and intact distal pulses.   No murmur heard. Pulmonary/Chest: Effort normal and breath sounds normal. No respiratory distress. She has no wheezes.  Abdominal: Soft. Bowel sounds are normal. She exhibits no distension. There is no tenderness.  Musculoskeletal: Normal range of motion. She exhibits no edema or tenderness.  Neurological: She is alert and oriented to person, place, and time.  Skin: Skin is warm and dry.  Psychiatric: She has a normal mood and affect. Her behavior is normal. Judgment and thought content normal.  Vitals reviewed.   BP (!) 144/93   Pulse 66   Temp (!) 97.3 F (36.3 C) (Oral)   Ht  (1.575 m)   Wt 224 lb (101.6 kg)   BMI 40.97 kg/m       Assessment & Plan:  1. Sore throat - Rapid strep screen (not at [facility])  2. Iron deficiency anemia, unspecified iron deficiency anemia type Continue Iron BID Labs pending Diet discussed Referral to Hematologists placed - Anemia Profile B  3. Strep throat - Take meds as prescribed -Force fluids -For fever or aces or pains- take tylenol or ibuprofen appropriate for age and weight. -Throat lozenges if help -New toothbrush in 3 days - azithromycin (ZITHROMAX) 250 MG tablet; Take 500 mg once, then 250 mg for four days  Dispense: 6 tablet; Refill: 0  4. Constipation, unspecified constipation type Start daily stool sofener    Jannifer Rodney, FNP

## 2016-11-22 LAB — ANEMIA PROFILE B
BASOS ABS: 0.1 10*3/uL (ref 0.0–0.2)
BASOS: 1 %
EOS (ABSOLUTE): 0.2 10*3/uL (ref 0.0–0.4)
Eos: 2 %
Ferritin: 63 ng/mL (ref 15–150)
Folate: 16.4 ng/mL (ref >3.0)
HEMATOCRIT: 33.5 % — AB (ref 34.0–46.6)
Hemoglobin: 10.7 g/dL — ABNORMAL LOW (ref 11.1–15.9)
IMMATURE GRANS (ABS): 0 10*3/uL (ref 0.0–0.1)
IRON SATURATION: 9 % — AB (ref 15–55)
Immature Granulocytes: 0 %
Iron: 35 ug/dL (ref 27–159)
LYMPHS: 23 %
Lymphocytes Absolute: 2.2 10*3/uL (ref 0.7–3.1)
MCH: 22.9 pg — ABNORMAL LOW (ref 26.6–33.0)
MCHC: 31.9 g/dL (ref 31.5–35.7)
MCV: 72 fL — ABNORMAL LOW (ref 79–97)
MONOCYTES: 5 %
MONOS ABS: 0.5 10*3/uL (ref 0.1–0.9)
NEUTROS PCT: 69 %
Neutrophils Absolute: 6.6 10*3/uL (ref 1.4–7.0)
PLATELETS: 294 10*3/uL (ref 150–379)
RBC: 4.67 x10E6/uL (ref 3.77–5.28)
RDW: 24 % — AB (ref 12.3–15.4)
Retic Ct Pct: 1.3 % (ref 0.6–2.6)
Total Iron Binding Capacity: 395 ug/dL (ref 250–450)
UIBC: 360 ug/dL (ref 131–425)
Vitamin B-12: 375 pg/mL (ref 232–1245)
WBC: 9.6 10*3/uL (ref 3.4–10.8)

## 2016-11-25 ENCOUNTER — Other Ambulatory Visit: Payer: Self-pay | Admitting: Family

## 2016-11-25 DIAGNOSIS — D509 Iron deficiency anemia, unspecified: Secondary | ICD-10-CM

## 2016-12-05 DIAGNOSIS — E669 Obesity, unspecified: Secondary | ICD-10-CM | POA: Diagnosis not present

## 2016-12-05 DIAGNOSIS — Z6841 Body Mass Index (BMI) 40.0 and over, adult: Secondary | ICD-10-CM | POA: Diagnosis not present

## 2016-12-05 DIAGNOSIS — K432 Incisional hernia without obstruction or gangrene: Secondary | ICD-10-CM | POA: Diagnosis not present

## 2016-12-20 ENCOUNTER — Ambulatory Visit (HOSPITAL_COMMUNITY): Payer: BLUE CROSS/BLUE SHIELD

## 2017-01-02 ENCOUNTER — Ambulatory Visit: Payer: BLUE CROSS/BLUE SHIELD | Admitting: Family

## 2017-01-23 ENCOUNTER — Ambulatory Visit: Payer: BLUE CROSS/BLUE SHIELD | Admitting: Family

## 2017-01-23 ENCOUNTER — Encounter: Payer: Self-pay | Admitting: Family

## 2017-01-23 VITALS — BP 106/68 | HR 61 | Temp 97.2°F | Ht 62.0 in | Wt 230.0 lb

## 2017-01-23 DIAGNOSIS — I10 Essential (primary) hypertension: Secondary | ICD-10-CM

## 2017-01-23 DIAGNOSIS — E1159 Type 2 diabetes mellitus with other circulatory complications: Secondary | ICD-10-CM

## 2017-01-23 DIAGNOSIS — E1165 Type 2 diabetes mellitus with hyperglycemia: Secondary | ICD-10-CM | POA: Diagnosis not present

## 2017-01-23 DIAGNOSIS — I152 Hypertension secondary to endocrine disorders: Secondary | ICD-10-CM

## 2017-01-23 DIAGNOSIS — E785 Hyperlipidemia, unspecified: Secondary | ICD-10-CM

## 2017-01-23 DIAGNOSIS — E1169 Type 2 diabetes mellitus with other specified complication: Secondary | ICD-10-CM | POA: Diagnosis not present

## 2017-01-23 DIAGNOSIS — D509 Iron deficiency anemia, unspecified: Secondary | ICD-10-CM | POA: Diagnosis not present

## 2017-01-23 DIAGNOSIS — E559 Vitamin D deficiency, unspecified: Secondary | ICD-10-CM

## 2017-01-23 LAB — BAYER DCA HB A1C WAIVED: HB A1C: 5.9 % (ref <7.0)

## 2017-01-23 MED ORDER — LISINOPRIL 20 MG PO TABS: 20.0000 mg | ORAL_TABLET | Freq: Every day | ORAL | 3 refills | Status: DC

## 2017-01-23 NOTE — Progress Notes (Signed)
Subjective:    Patient ID: Gabriela Watkins, female    DOB: December 10, 1965, 51 y.o.   MRN: [id number]  Pt presents to the office today for chronic follow up.  Hypertension  This is a chronic problem. The current episode started more than 1 year ago. The problem has been resolved since onset. The problem is controlled. Pertinent negatives include no malaise/fatigue, peripheral edema or shortness of breath. Risk factors for coronary artery disease include diabetes mellitus, dyslipidemia, obesity and sedentary lifestyle. The current treatment provides significant improvement. There is no history of kidney disease, CAD/MI, CVA or heart failure.  Hyperlipidemia  This is a chronic problem. The current episode started more than 1 year ago. The problem is uncontrolled. Recent lipid tests were reviewed and are high. Exacerbating diseases include obesity. Pertinent negatives include no shortness of breath. Current antihyperlipidemic treatment includes statins. The current treatment provides moderate improvement of lipids. Risk factors for coronary artery disease include diabetes mellitus, dyslipidemia, obesity, hypertension, a sedentary lifestyle and post-menopausal.  Diabetes  She presents for her follow-up diabetic visit. She has type 2 diabetes mellitus. Her disease course has been stable. There are no hypoglycemic associated symptoms. Pertinent negatives for hypoglycemia include no confusion. Associated symptoms include weight loss. Pertinent negatives for diabetes include no foot paresthesias and no foot ulcerations. There are no hypoglycemic complications. Symptoms are stable. There are no diabetic complications. Pertinent negatives for diabetic complications include no CVA, heart disease, nephropathy or peripheral neuropathy. She is following a generally healthy diet. Her breakfast blood glucose range is generally 110-130 mg/dl.  Anemia  Presents for follow-up visit. Symptoms include weight loss. There has been no  confusion or malaise/fatigue. There is no history of heart failure.      Review of Systems  Constitutional: Positive for weight loss. Negative for malaise/fatigue.  Respiratory: Negative for shortness of breath.   Psychiatric/Behavioral: Negative for confusion.  All other systems reviewed and are negative.      Objective:   Physical Exam  Constitutional: She is oriented to person, place, and time. She appears well-developed and well-nourished. No distress.  Morbid obese   HENT:  Head: Normocephalic and atraumatic.  Right Ear: External ear normal.  Left Ear: External ear normal.  Nose: Nose normal.  Mouth/Throat: Oropharynx is clear and moist.  Eyes: Pupils are equal, round, and reactive to light.  Neck: Normal range of motion. Neck supple. No thyromegaly present.  Cardiovascular: Normal rate, regular rhythm, normal heart sounds and intact distal pulses.  No murmur heard. Pulmonary/Chest: Effort normal and breath sounds normal. No respiratory distress. She has no wheezes.  Abdominal: Soft. Bowel sounds are normal. She exhibits no distension. There is no tenderness.  Musculoskeletal: Normal range of motion. She exhibits no edema or tenderness.  Neurological: She is alert and oriented to person, place, and time.  Skin: Skin is warm and dry.  Psychiatric: She has a normal mood and affect. Her behavior is normal. Judgment and thought content normal.  Vitals reviewed.    BP (!) 96/58   Pulse 62   Temp (!) 97.2 F (36.2 C) (Oral)   Ht '5\' 2"'$  (1.575 m)   Wt 230 lb (104.3 kg)   BMI 42.07 kg/m      Assessment & Plan:  1. Type 2 diabetes mellitus with hyperglycemia, without long-term current use of insulin ([place]) - Bayer [place] Hb A1c Waived - CMP14+EGFR - Ambulatory referral to Ophthalmology  2. Hypertension associated with diabetes ([place]) -HCTZ 12.5 mg stopped today related  to hypotension  - CMP14+EGFR - lisinopril (PRINIVIL,ZESTRIL) 20 MG tablet; Take 1 tablet (20 mg total)  by mouth daily.  Dispense: 90 tablet; Refill: 3  3. Iron deficiency anemia, unspecified iron deficiency anemia type - CMP14+EGFR - Anemia Profile B  4. Hyperlipidemia associated with type 2 diabetes mellitus (HCC) - CMP14+EGFR - Lipid panel  5. Obesity, morbid ([place]) - CMP14+EGFR  6. Vitamin D deficiency - CMP14+EGFR   Continue all meds Labs pending Health Maintenance reviewed Diet and exercise encouraged RTO 3  Months   Evelina Dun, FNP

## 2017-01-23 NOTE — Patient Instructions (Signed)
Exercising to Lose Weight Exercising can help you to lose weight. In order to lose weight through exercise, you need to do vigorous-intensity exercise. You can tell that you are exercising with vigorous intensity if you are breathing very hard and fast and cannot hold a conversation while exercising. Moderate-intensity exercise helps to maintain your current weight. You can tell that you are exercising at a moderate level if you have a higher heart rate and faster breathing, but you are still able to hold a conversation. How often should I exercise? Choose an activity that you enjoy and set realistic goals. Your health care provider can help you to make an activity plan that works for you. Exercise regularly as directed by your health care provider. This may include:  Doing resistance training twice each week, such as: ? Push-ups. ? Sit-ups. ? Lifting weights. ? Using resistance bands.  Doing a given intensity of exercise for a given amount of time. Choose from these options: ? 150 minutes of moderate-intensity exercise every week. ? 75 minutes of vigorous-intensity exercise every week. ? A mix of moderate-intensity and vigorous-intensity exercise every week.  Children, pregnant women, people who are out of shape, people who are overweight, and older adults may need to consult a health care provider for individual recommendations. If you have any sort of medical condition, be sure to consult your health care provider before starting a new exercise program. What are some activities that can help me to lose weight?  Walking at a rate of at least 4.5 miles an hour.  Jogging or running at a rate of 5 miles per hour.  Biking at a rate of at least 10 miles per hour.  Lap swimming.  Roller-skating or in-line skating.  Cross-country skiing.  Vigorous competitive sports, such as football, basketball, and soccer.  Jumping rope.  Aerobic dancing. How can I be more active in my day-to-day  activities?  Use the stairs instead of the elevator.  Take a walk during your lunch break.  If you drive, park your car farther away from work or school.  If you take public transportation, get off one stop early and walk the rest of the way.  Make all of your phone calls while standing up and walking around.  Get up, stretch, and walk around every 30 minutes throughout the day. What guidelines should I follow while exercising?  Do not exercise so much that you hurt yourself, feel dizzy, or get very short of breath.  Consult your health care provider prior to starting a new exercise program.  Wear comfortable clothes and shoes with good support.  Drink plenty of water while you exercise to prevent dehydration or heat stroke. Body water is lost during exercise and must be replaced.  Work out until you breathe faster and your heart beats faster. This information is not intended to replace advice given to you by your health care provider. Make sure you discuss any questions you have with your health care provider. Document Released: 03/09/2010 Document Revised: 07/13/2015 Document Reviewed: 07/08/2013 Elsevier Interactive Patient Education  2018 Elsevier Inc.  

## 2017-01-24 ENCOUNTER — Other Ambulatory Visit: Payer: Self-pay | Admitting: Family

## 2017-01-24 LAB — ANEMIA PROFILE B
BASOS ABS: 0 10*3/uL (ref 0.0–0.2)
Basos: 0 %
EOS (ABSOLUTE): 0.2 10*3/uL (ref 0.0–0.4)
Eos: 3 %
FOLATE: 18.4 ng/mL (ref >3.0)
Ferritin: 32 ng/mL (ref 15–150)
HEMOGLOBIN: 12.7 g/dL (ref 11.1–15.9)
Hematocrit: 38.5 % (ref 34.0–46.6)
IMMATURE GRANULOCYTES: 0 %
Immature Grans (Abs): 0 10*3/uL (ref 0.0–0.1)
Iron Saturation: 23 % (ref 15–55)
Iron: 87 ug/dL (ref 27–159)
LYMPHS ABS: 2.5 10*3/uL (ref 0.7–3.1)
LYMPHS: 36 %
MCH: 26.3 pg — AB (ref 26.6–33.0)
MCHC: 33 g/dL (ref 31.5–35.7)
MCV: 80 fL (ref 79–97)
MONOS ABS: 0.5 10*3/uL (ref 0.1–0.9)
Monocytes: 8 %
NEUTROS PCT: 53 %
Neutrophils Absolute: 3.7 10*3/uL (ref 1.4–7.0)
Platelets: 269 10*3/uL (ref 150–379)
RBC: 4.83 x10E6/uL (ref 3.77–5.28)
RDW: 21.9 % — ABNORMAL HIGH (ref 12.3–15.4)
Retic Ct Pct: 1 % (ref 0.6–2.6)
TIBC: 386 ug/dL (ref 250–450)
UIBC: 299 ug/dL (ref 131–425)
Vitamin B-12: 361 pg/mL (ref 232–1245)
WBC: 6.9 10*3/uL (ref 3.4–10.8)

## 2017-01-24 LAB — CMP14+EGFR
ALBUMIN: 4.2 g/dL (ref 3.5–5.5)
ALT: 16 IU/L (ref 0–32)
AST: 17 IU/L (ref 0–40)
Albumin/Globulin Ratio: 1.4 (ref 1.2–2.2)
Alkaline Phosphatase: 110 IU/L (ref 39–117)
BILIRUBIN TOTAL: 0.3 mg/dL (ref 0.0–1.2)
BUN / CREAT RATIO: 24 — AB (ref 9–23)
BUN: 14 mg/dL (ref 6–24)
CALCIUM: 9.4 mg/dL (ref 8.7–10.2)
CHLORIDE: 102 mmol/L (ref 96–106)
CO2: 23 mmol/L (ref 20–29)
CREATININE: 0.58 mg/dL (ref 0.57–1.00)
GFR calc non Af Amer: 109 mL/min/{1.73_m2} (ref >59)
GFR, EST AFRICAN AMERICAN: 125 mL/min/{1.73_m2} (ref >59)
GLUCOSE: 110 mg/dL — AB (ref 65–99)
Globulin, Total: 3 g/dL (ref 1.5–4.5)
Potassium: 4.1 mmol/L (ref 3.5–5.2)
Sodium: 140 mmol/L (ref 134–144)
TOTAL PROTEIN: 7.2 g/dL (ref 6.0–8.5)

## 2017-01-24 LAB — LIPID PANEL
Chol/HDL Ratio: 5.4 ratio — ABNORMAL HIGH (ref 0.0–4.4)
Cholesterol, Total: 221 mg/dL — ABNORMAL HIGH (ref 100–199)
HDL: 41 mg/dL (ref >39)
LDL CALC: 154 mg/dL — AB (ref 0–99)
Triglycerides: 131 mg/dL (ref 0–149)
VLDL CHOLESTEROL CAL: 26 mg/dL (ref 5–40)

## 2017-01-31 ENCOUNTER — Encounter: Payer: Self-pay | Admitting: *Deleted

## 2017-04-10 DIAGNOSIS — D729 Disorder of white blood cells, unspecified: Secondary | ICD-10-CM | POA: Diagnosis not present

## 2017-04-10 DIAGNOSIS — Z6841 Body Mass Index (BMI) 40.0 and over, adult: Secondary | ICD-10-CM | POA: Diagnosis not present

## 2017-05-29 ENCOUNTER — Ambulatory Visit: Payer: BLUE CROSS/BLUE SHIELD | Admitting: Family

## 2017-05-29 ENCOUNTER — Encounter: Payer: Self-pay | Admitting: Family

## 2017-05-29 VITALS — BP 134/89 | HR 64 | Temp 99.2°F | Ht 62.0 in | Wt 232.0 lb

## 2017-05-29 DIAGNOSIS — E1159 Type 2 diabetes mellitus with other circulatory complications: Secondary | ICD-10-CM | POA: Diagnosis not present

## 2017-05-29 DIAGNOSIS — I152 Hypertension secondary to endocrine disorders: Secondary | ICD-10-CM

## 2017-05-29 DIAGNOSIS — I10 Essential (primary) hypertension: Secondary | ICD-10-CM | POA: Diagnosis not present

## 2017-05-29 DIAGNOSIS — D509 Iron deficiency anemia, unspecified: Secondary | ICD-10-CM

## 2017-05-29 DIAGNOSIS — E559 Vitamin D deficiency, unspecified: Secondary | ICD-10-CM | POA: Diagnosis not present

## 2017-05-29 DIAGNOSIS — E785 Hyperlipidemia, unspecified: Secondary | ICD-10-CM

## 2017-05-29 DIAGNOSIS — E1169 Type 2 diabetes mellitus with other specified complication: Secondary | ICD-10-CM | POA: Diagnosis not present

## 2017-05-29 LAB — BAYER DCA HB A1C WAIVED: HB A1C (BAYER DCA - WAIVED): 5.6 % (ref <7.0)

## 2017-05-29 NOTE — Progress Notes (Signed)
Subjective:    Patient ID: Gabriela Watkins, female    DOB: Apr 28, 1965, 52 y.o.   MRN: [id number]  Pt presents to the office today for chronic follow up.  Diabetes  She presents for her follow-up diabetic visit. She has type 2 diabetes mellitus. Her disease course has been stable. There are no hypoglycemic associated symptoms. Associated symptoms include blurred vision. Pertinent negatives for diabetes include no foot paresthesias, no foot ulcerations and no visual change. There are no hypoglycemic complications. Symptoms are stable. Pertinent negatives for diabetic complications include no CVA, heart disease, nephropathy or peripheral neuropathy. Risk factors for coronary artery disease include diabetes mellitus, dyslipidemia, hypertension and sedentary lifestyle. She is following a generally healthy diet. Her overall blood glucose range is 110-130 mg/dl. Eye exam is current.  Hyperlipidemia  This is a chronic problem. The current episode started more than 1 year ago. The problem is uncontrolled. Recent lipid tests were reviewed and are high. Exacerbating diseases include obesity. Pertinent negatives include no shortness of breath. Current antihyperlipidemic treatment includes statins. The current treatment provides moderate improvement of lipids. Risk factors for coronary artery disease include dyslipidemia, diabetes mellitus, hypertension and a sedentary lifestyle.  Hypertension  This is a chronic problem. The current episode started more than 1 year ago. The problem has been resolved since onset. The problem is controlled. Associated symptoms include blurred vision. Pertinent negatives include no malaise/fatigue, peripheral edema or shortness of breath. Risk factors for coronary artery disease include dyslipidemia, diabetes mellitus and obesity. The current treatment provides moderate improvement. There is no history of kidney disease, CAD/MI or CVA.  Anemia  Presents for follow-up visit. There has been  no bruising/bleeding easily, light-headedness or malaise/fatigue.      Review of Systems  Constitutional: Negative for malaise/fatigue.  Eyes: Positive for blurred vision.  Respiratory: Negative for shortness of breath.   Neurological: Negative for light-headedness.  Hematological: Does not bruise/bleed easily.  All other systems reviewed and are negative.      Objective:   Physical Exam  Constitutional: She is oriented to person, place, and time. She appears well-developed and well-nourished. No distress.  Morbid obese  HENT:  Head: Normocephalic and atraumatic.  Right Ear: External ear normal.  Left Ear: External ear normal.  Nose: Nose normal.  Mouth/Throat: Oropharynx is clear and moist.  Eyes: Pupils are equal, round, and reactive to light.  Neck: Normal range of motion. Neck supple. No thyromegaly present.  Cardiovascular: Normal rate, regular rhythm, normal heart sounds and intact distal pulses.  No murmur heard. Pulmonary/Chest: Effort normal and breath sounds normal. No respiratory distress. She has no wheezes.  Abdominal: Soft. Bowel sounds are normal. She exhibits no distension. There is no tenderness.  Musculoskeletal: Normal range of motion. She exhibits no edema or tenderness.  Neurological: She is alert and oriented to person, place, and time.  Skin: Skin is warm and dry.  Psychiatric: She has a normal mood and affect. Her behavior is normal. Judgment and thought content normal.  Vitals reviewed.     BP 134/89   Pulse 64   Temp 99.2 F (37.3 C) (Oral)   Ht _0  (1.575 m)   Wt 232 lb (105.2 kg)   BMI 42.43 kg/m      Assessment & Plan:  1. Hypertension associated with diabetes ([place]) - CMP14+EGFR  2. Hyperlipidemia associated with type 2 diabetes mellitus (HCC)  - CMP14+EGFR - Lipid panel  3. Type 2 diabetes mellitus with other specified complication, without long-term  current use of insulin ([place]) - CMP14+EGFR - Bayer [place] Hb A1c Waived -  Ambulatory referral to Ophthalmology  4. Obesity, morbid (HCC) - CMP14+EGFR  5. Vitamin D deficiency - CMP14+EGFR - VITAMIN D 25 Hydroxy (Vit-D Deficiency, Fractures)  6. Iron deficiency anemia, unspecified iron deficiency anemia type  - Anemia Profile B - CMP14+EGFR   Continue all meds Labs pending Health Maintenance reviewed Diet and exercise encouraged RTO 4 months   Evelina Dun, FNP

## 2017-05-29 NOTE — Patient Instructions (Signed)
[place] (Health Maintenance, Female) Un estilo de vida saludable y los cuidados preventivos pueden favorecer considerablemente a la salud y Musician. Pregunte a su mdico cul es el cronograma de exmenes peridicos apropiado para usted. Esta es una buena oportunidad para consultarlo sobre cmo prevenir enfermedades y [place] sano. Adems de los controles, hay muchas otras cosas que puede hacer usted mismo. Los expertos han realizado numerosas investigaciones [name] cambios en el estilo de vida y las medidas de prevencin que, [place], lo ayudarn a mantenerse sano. Solicite a su mdico ms informacin. EL PESO Y LA DIETA Consuma una dieta saludable.  Asegrese de [name] verduras, frutas, productos lcteos de bajo contenido de [name] y [name].  No consuma muchos alimentos de alto contenido de grasas slidas, azcares agregados o sal.  Realice actividad fsica con regularidad. Esta es una de las prcticas ms importantes que puede hacer por su salud. ? La Delorise Shiner de los adultos deben hacer ejercicio durante al menos 124mnutos por semana. El ejercicio debe aumentar la frecuencia cardaca y pActorla transpiracin (ejercicio de [place]. ? La mayora de los adultos tambin deben hacer ejercicios de elongacin al mToysRusveces a la semana. Agregue esto al su plan de ejercicio de intensidad moderada. Mantenga un peso saludable.  El ndice de masa corporal ([facility] es una medida que puede utilizarse para identificar posibles problemas de pEast Uniontown Proporciona una estimacin de la grasa corporal basndose en el peso y la altura. Su mdico puede ayudarle a dRadiation protection practitionerISouth Endy a lScientist, forensico mTheatre managerun peso saludable.  Para las mujeres de 20aos o ms: ? Un IJohn R. Oishei Children'S Hospitalmenor de 18,5 se considera bajo peso. ? Un ICumberland County Hospitalentre 18,5 y 24,9 es normal. ? Un IPelham Medical Centerentre 25 y 29,9 se considera sobrepeso. ? Un IMC de 30 o ms se considera  obesidad. Observe los niveles de colesterol y lpidos en la sangre.  Debe comenzar a rEnglish as a second language teacherde lpidos y cResearch officer, trade unionen la sangre a los 20aos y luego repetirlos cada 516aos  Es posible que nAutomotive engineerlos niveles de colesterol con mayor frecuencia si: ? Sus niveles de lpidos y colesterol son altos. ? Es mayor de 527CWC ? Presenta un alto riesgo de padecer enfermedades cardacas. DETECCIN DE CNCER Cncer de pulmn  Se recomienda realizar exmenes de deteccin de cncer de pulmn a personas adultas entre 574y 892aos que estn en riesgo de dHorticulturist, commercialde pulmn por sus antecedentes de consumo de tabaco.  Se recomienda una tomografa computarizada de baja dosis de los pulmones todos los aos a las personas que: ? Fuman actualmente. ? Hayan dejado el hbito en algn momento en los ltimos 15aos. ? Hayan fumado durante 30aos un paquete diario. Un paquete-ao equivale a fumar un promedio de un paquete de cigarrillos diario durante un ao.  Los exmenes de deteccin anuales deben continuar hasta que hayan pasado 15aos desde que dej de fumar.  Ya no debern realizarse si tiene un problema de salud que le impida recibir tratamiento para eScience writerde pulmn. Cncer de mama  Practique la autoconciencia de la mama. Esto significa reconocer la apariencia normal de sus mamas y cmo las siente.  Tambin significa realizar autoexmenes regulares de lJohnson & Johnson Informe a su mdico sobre cualquier cambio, sin importar cun pequeo sea.  Si tiene entre 20 y 363aos, un mdico debe realizarle un examen clnico de las mamas como parte del examen regular de s[place] cada 1 a  3aos.  Si tiene 40aos o ms, debe realizarse un examen clnico de las mamas todos los aos. Tambin considere realizarse una radiografa de las mamas (mamografa) todos los aos.  Si tiene antecedentes familiares de cncer de mama, hable con su mdico para someterse a un estudio gentico.  Si  tiene alto riesgo de padecer cncer de mama, hable con su mdico para someterse a una resonancia magntica y una mamografa todos los aos.  La evaluacin del gen del cncer de mama (BRCA) se recomienda a mujeres que tengan familiares con cnceres relacionados con el BRCA. Los cnceres relacionados con el BRCA incluyen los siguientes: ? Mama. ? Ovario. ? Trompas. ? Cnceres de peritoneo.  Los resultados de la evaluacin determinarn la necesidad de asesoramiento gentico y de anlisis de BRCA1 y BRCA2. Cncer de cuello del tero El mdico puede recomendarle que se haga pruebas peridicas de deteccin de cncer de los rganos de la pelvis (ovarios, tero y vagina). Estas pruebas incluyen un examen plvico, que abarca controlar si se produjeron cambios microscpicos en la superficie del cuello del tero (prueba de Papanicolaou). Pueden recomendarle que se haga estas pruebas cada 3aos, a partir de los 21aos.  A las mujeres que tienen entre 30 y 65aos, los mdicos pueden recomendarles que se sometan a exmenes plvicos y pruebas de Papanicolaou cada 3aos, o a la prueba de Papanicolaou y el examen plvico en combinacin con estudios de deteccin del virus del papiloma humano (VPH) cada 5aos. Algunos tipos de VPH aumentan el riesgo de padecer cncer de cuello del tero. La prueba para la deteccin del VPH tambin puede realizarse a mujeres de cualquier edad cuyos resultados de la prueba de Papanicolaou no sean claros.  Es posible que otros mdicos no recomienden exmenes de deteccin a mujeres no embarazadas que se consideran sujetos de bajo riesgo de padecer cncer de pelvis y que no tienen sntomas. Pregntele al mdico si un examen plvico de deteccin es adecuado para usted.  Si ha recibido un tratamiento para el cncer cervical o una enfermedad que podra causar cncer, necesitar realizarse una prueba de Papanicolaou y controles durante al menos 20 aos de concluido el tratamiento. Si no se  ha hecho el Papanicolaou con regularidad, debern volver a evaluarse los factores de riesgo (como tener un nuevo compaero sexual), para determinar si debe realizarse los estudios nuevamente. Algunas mujeres sufren problemas mdicos que aumentan la probabilidad de contraer cncer de cuello del tero. En estos casos, el mdico podr indicar que se realicen controles y pruebas de Papanicolaou con ms frecuencia. Cncer colorrectal  Este tipo de cncer puede detectarse y a menudo prevenirse.  Por lo general, los estudios de rutina se deben comenzar a hacer a partir de los 50 aos y hasta los 75 aos.  Sin embargo, el mdico podr aconsejarle que lo haga antes, si tiene factores de riesgo para el cncer de colon.  Tambin puede recomendarle que use un kit de prueba para hallar sangre oculta en la materia fecal.  Es posible que se use una pequea cmara en el extremo de un tubo para examinar directamente el colon (sigmoidoscopia o colonoscopia) a fin de detectar formas tempranas de cncer colorrectal.  Los exmenes de rutina generalmente comienzan a los 50aos.  El examen directo del colon se debe repetir cada 5 a 10aos hasta los 75aos. Sin embargo, es posible que se realicen exmenes con mayor frecuencia, si se detectan formas tempranas de plipos precancerosos o pequeos bultos. Cncer de piel  Revise la piel   de la cabeza a los pies con regularidad.  Informe a su mdico si aparecen nuevos lunares o los que tiene se modifican, especialmente en su forma y color.  Tambin notifique al mdico si tiene un lunar que es ms grande que el tamao de una goma de lpiz.  Siempre use pantalla solar. Aplique pantalla solar de manera libre y repetida a lo largo del da.  Protjase usando mangas y pantalones largos, un sombrero de ala ancha y gafas para el sol, siempre que se encuentre en el exterior. ENFERMEDADES CARDACAS, DIABETES E HIPERTENSIN ARTERIAL  La hipertensin arterial causa  enfermedades cardacas y aumenta el riesgo de ictus. La hipertensin arterial es ms probable en los siguientes casos: ? Las personas que tienen la presin arterial en el extremo del rango normal (100-139/85-89 mm Hg). ? Las personas con sobrepeso u obesidad. ? Las personas afroamericanas.  Si usted tiene entre 18 y 39 aos, debe medirse la presin arterial cada 3 a 5 aos. Si usted tiene 40 aos o ms, debe medirse la presin arterial todos los aos. Debe medirse la presin arterial dos veces: una vez cuando est en un hospital o una clnica y la otra vez cuando est en otro sitio. Registre el promedio de las dos mediciones. Para controlar su presin arterial cuando no est en un hospital o una clnica, puede usar lo siguiente: ? Una mquina automtica para medir la presin arterial en una farmacia. ? Un monitor para medir la presin arterial en el hogar.  Si tiene entre 55 y 79 aos, consulte a su mdico si debe tomar aspirina para prevenir el ictus.  Realcese exmenes de deteccin de la diabetes con regularidad. Esto incluye la toma de una muestra de sangre para controlar el nivel de azcar en la sangre durante el ayuno. ? Si tiene un peso normal y un bajo riesgo de padecer diabetes, realcese este anlisis cada tres aos despus de los 45aos. ? Si tiene sobrepeso y un alto riesgo de padecer diabetes, considere someterse a este anlisis antes o con mayor frecuencia. PREVENCIN DE INFECCIONES HepatitisB  Si tiene un riesgo ms alto de contraer hepatitis B, debe someterse a un examen de deteccin de este virus. Se considera que tiene un alto riesgo de contraer hepatitis B si: ? Naci en un pas donde la hepatitis B es frecuente. Pregntele a su mdico qu pases son considerados de alto riesgo. ? Sus padres nacieron en un pas de alto riesgo y usted no recibi una vacuna que lo proteja contra la hepatitis B (vacuna contra la hepatitis B). ? Tiene VIH o sida. ? Usa agujas para inyectarse  drogas. ? Vive con alguien que tiene hepatitis B. ? Ha tenido sexo con alguien que tiene hepatitis B. ? Recibe tratamiento de hemodilisis. ? Toma ciertos medicamentos para el cncer, trasplante de rganos y afecciones autoinmunitarias. Hepatitis C  Se recomienda un anlisis de sangre para: ? Todos los que nacieron entre 1945 y 1965. ? Todas las personas que tengan un riesgo de haber contrado hepatitis C. Enfermedades de transmisin sexual (ETS).  Debe realizarse pruebas de deteccin de enfermedades de transmisin sexual (ETS), incluidas gonorrea y clamidia si: ? Es sexualmente activo y es menor de 24aos. ? Es mayor de 24aos, y el mdico le informa que corre riesgo de tener este tipo de infecciones. ? La actividad sexual ha cambiado desde que le hicieron la ltima prueba de deteccin y tiene un riesgo mayor de tener clamidia o gonorrea. Pregntele al mdico si usted   tiene riesgo.  Si no tiene el VIH, pero corre riesgo de infectarse por el virus, se recomienda tomar diariamente un medicamento recetado para evitar la infeccin. Esto se conoce como profilaxis previa a la exposicin. Se considera que est en riesgo si: ? Es [place] sexualmente y no [place] preservativos habitualmente o no conoce el estado del VIH de sus Advertising copywriter. ? Se inyecta drogas. ? Es [place] sexualmente con Ardelia Mems pareja que tiene VIH. Consulte a su mdico para saber si tiene un alto riesgo de infectarse por el VIH. Si opta por comenzar la profilaxis previa a la exposicin, primero debe realizarse anlisis de deteccin del VIH. Luego, le harn anlisis cada 39mses mientras est tomando los medicamentos para la profilaxis previa a la exposicin. [facility] Si es premenopusica y puede quedar eVan Buren solicite a su mdico asesoramiento previo a la concepcin.  Si puede quedar embarazada, tome 400 a [id number]([id number]) de cido fAnheuser-Busch  Si desea evitar el embarazo, hable con su mdico sobre el  control de la natalidad (anticoncepcin). OSTEOPOROSIS Y MENOPAUSIA  La osteoporosis es una enfermedad en la que los huesos pierden los minerales y la fuerza por el avance de la edad. El resultado pueden ser fracturas graves en los hNorthchase El riesgo de osteoporosis puede identificarse con uArdelia Memsprueba de densidad sea.  Si tiene 65aos o ms, o si est en riesgo de sufrir osteoporosis y fracturas, pregunte a su mdico si debe someterse a exmenes.  Consulte a su mdico si debe tomar un suplemento de calcio o de vitamina D para reducir el riesgo de osteoporosis.  La menopausia puede presentar ciertos sntomas fsicos y rGaffer  La terapia de reemplazo hormonal puede reducir algunos de estos sntomas y rGaffer Consulte a su mdico para saber si la terapia de reemplazo hormonal es conveniente para usted. INSTRUCCIONES PARA EL CUIDADO EN EL HOGAR  Realcese los estudios de rutina de la salud, dentales y de lPublic librarian  MBig Stone  No consuma ningn producto que contenga tabaco, lo que incluye cigarrillos, tabaco de mHigher education careers advisero cPsychologist, sport and exercise  Si est embarazada, no beba alcohol.  Si est amamantando, reduzca el consumo de alcohol y la frecuencia con la que consume.  Si es mujer y no est embarazada limite el consumo de alcohol a no ms de 1 medida por da. Una medida equivale a 12onzas de cerveza, 5onzas de vino o 1onzas de bebidas alcohlicas de alta graduacin.  No consuma drogas.  No comparta agujas.  Solicite ayuda a su mdico si necesita apoyo o informacin para abandonar las drogas.  Informe a su mdico si a menudo se siente deprimido.  Notifique a su mdico si alguna vez ha sido vctima de abuso o si no se siente seguro en su hogar. Esta informacin no tiene cMarine scientistel consejo del mdico. Asegrese de hacerle al mdico cualquier pregunta que tenga. Document Released: 01/24/2011 Document Revised: 02/25/2014 Document Reviewed:  11/08/2014 Elsevier Interactive Patient Education  2[employer]

## 2017-05-30 ENCOUNTER — Other Ambulatory Visit: Payer: Self-pay | Admitting: Family

## 2017-05-30 DIAGNOSIS — E559 Vitamin D deficiency, unspecified: Secondary | ICD-10-CM

## 2017-05-30 LAB — CMP14+EGFR
ALT: 14 IU/L (ref 0–32)
AST: 15 IU/L (ref 0–40)
Albumin/Globulin Ratio: 1.6 (ref 1.2–2.2)
Albumin: 4.1 g/dL (ref 3.5–5.5)
Alkaline Phosphatase: 86 IU/L (ref 39–117)
BUN/Creatinine Ratio: 20 (ref 9–23)
BUN: 11 mg/dL (ref 6–24)
Bilirubin Total: 0.3 mg/dL (ref 0.0–1.2)
CALCIUM: 9 mg/dL (ref 8.7–10.2)
CO2: 21 mmol/L (ref 20–29)
CREATININE: 0.55 mg/dL — AB (ref 0.57–1.00)
Chloride: 108 mmol/L — ABNORMAL HIGH (ref 96–106)
GFR calc Af Amer: 127 mL/min/{1.73_m2} (ref >59)
GFR, EST NON AFRICAN AMERICAN: 110 mL/min/{1.73_m2} (ref >59)
GLOBULIN, TOTAL: 2.6 g/dL (ref 1.5–4.5)
GLUCOSE: 108 mg/dL — AB (ref 65–99)
Potassium: 4.6 mmol/L (ref 3.5–5.2)
SODIUM: 139 mmol/L (ref 134–144)
Total Protein: 6.7 g/dL (ref 6.0–8.5)

## 2017-05-30 LAB — ANEMIA PROFILE B
BASOS ABS: 0 10*3/uL (ref 0.0–0.2)
Basos: 0 %
EOS (ABSOLUTE): 0.2 10*3/uL (ref 0.0–0.4)
EOS: 3 %
Ferritin: 15 ng/mL (ref 15–150)
Folate: >20 ng/mL (ref >3.0)
HEMOGLOBIN: 12.5 g/dL (ref 11.1–15.9)
Hematocrit: 36.5 % (ref 34.0–46.6)
IMMATURE GRANS (ABS): 0 10*3/uL (ref 0.0–0.1)
IMMATURE GRANULOCYTES: 0 %
IRON SATURATION: 17 % (ref 15–55)
IRON: 69 ug/dL (ref 27–159)
LYMPHS: 37 %
Lymphocytes Absolute: 2.4 10*3/uL (ref 0.7–3.1)
MCH: 29.3 pg (ref 26.6–33.0)
MCHC: 34.2 g/dL (ref 31.5–35.7)
MCV: 86 fL (ref 79–97)
MONOS ABS: 0.4 10*3/uL (ref 0.1–0.9)
Monocytes: 6 %
Neutrophils Absolute: 3.4 10*3/uL (ref 1.4–7.0)
Neutrophils: 54 %
Platelets: 263 10*3/uL (ref 150–379)
RBC: 4.27 x10E6/uL (ref 3.77–5.28)
RDW: 14.3 % (ref 12.3–15.4)
Retic Ct Pct: 1.4 % (ref 0.6–2.6)
TIBC: 405 ug/dL (ref 250–450)
UIBC: 336 ug/dL (ref 131–425)
Vitamin B-12: 330 pg/mL (ref 232–1245)
WBC: 6.4 10*3/uL (ref 3.4–10.8)

## 2017-05-30 LAB — LIPID PANEL
CHOL/HDL RATIO: 4.8 ratio — AB (ref 0.0–4.4)
CHOLESTEROL TOTAL: 201 mg/dL — AB (ref 100–199)
HDL: 42 mg/dL (ref >39)
LDL CALC: 135 mg/dL — AB (ref 0–99)
TRIGLYCERIDES: 118 mg/dL (ref 0–149)
VLDL CHOLESTEROL CAL: 24 mg/dL (ref 5–40)

## 2017-05-30 LAB — VITAMIN D 25 HYDROXY (VIT D DEFICIENCY, FRACTURES): Vit D, 25-Hydroxy: 18.3 ng/mL — ABNORMAL LOW (ref 30.0–100.0)

## 2017-05-30 MED ORDER — VITAMIN D (ERGOCALCIFEROL) 1.25 MG (50000 UNIT) PO CAPS: 50000.0000 [IU] | ORAL_CAPSULE | ORAL | 3 refills | Status: DC

## 2017-06-12 DIAGNOSIS — Z6841 Body Mass Index (BMI) 40.0 and over, adult: Secondary | ICD-10-CM | POA: Diagnosis not present

## 2017-06-12 DIAGNOSIS — K432 Incisional hernia without obstruction or gangrene: Secondary | ICD-10-CM | POA: Diagnosis not present

## 2017-07-10 DIAGNOSIS — H524 Presbyopia: Secondary | ICD-10-CM | POA: Diagnosis not present

## 2017-07-10 DIAGNOSIS — Z7984 Long term (current) use of oral hypoglycemic drugs: Secondary | ICD-10-CM | POA: Diagnosis not present

## 2017-07-10 DIAGNOSIS — E119 Type 2 diabetes mellitus without complications: Secondary | ICD-10-CM | POA: Diagnosis not present

## 2017-07-10 LAB — HM DIABETES EYE EXAM

## 2017-09-11 DIAGNOSIS — Z6841 Body Mass Index (BMI) 40.0 and over, adult: Secondary | ICD-10-CM | POA: Diagnosis not present

## 2017-09-11 DIAGNOSIS — K432 Incisional hernia without obstruction or gangrene: Secondary | ICD-10-CM | POA: Diagnosis not present

## 2017-10-09 ENCOUNTER — Ambulatory Visit: Payer: BLUE CROSS/BLUE SHIELD | Admitting: Family

## 2017-10-16 ENCOUNTER — Encounter: Payer: Self-pay | Admitting: Family

## 2017-10-16 ENCOUNTER — Ambulatory Visit: Payer: BLUE CROSS/BLUE SHIELD | Admitting: Family

## 2017-10-16 VITALS — BP 107/65 | HR 59 | Temp 97.8°F | Ht 62.0 in | Wt 237.0 lb

## 2017-10-16 DIAGNOSIS — I10 Essential (primary) hypertension: Secondary | ICD-10-CM

## 2017-10-16 DIAGNOSIS — E785 Hyperlipidemia, unspecified: Secondary | ICD-10-CM | POA: Diagnosis not present

## 2017-10-16 DIAGNOSIS — D509 Iron deficiency anemia, unspecified: Secondary | ICD-10-CM

## 2017-10-16 DIAGNOSIS — I152 Hypertension secondary to endocrine disorders: Secondary | ICD-10-CM

## 2017-10-16 DIAGNOSIS — E1169 Type 2 diabetes mellitus with other specified complication: Secondary | ICD-10-CM

## 2017-10-16 DIAGNOSIS — E559 Vitamin D deficiency, unspecified: Secondary | ICD-10-CM | POA: Diagnosis not present

## 2017-10-16 DIAGNOSIS — E1159 Type 2 diabetes mellitus with other circulatory complications: Secondary | ICD-10-CM

## 2017-10-16 NOTE — Patient Instructions (Signed)
Gabriela Watkins (Health Maintenance, Female) Un estilo de vida saludable y los cuidados preventivos pueden favorecer considerablemente a la salud y Musician. Pregunte a su mdico cul es el cronograma de exmenes peridicos apropiado para usted. Esta es una buena oportunidad para consultarlo sobre cmo prevenir enfermedades y [place] sano. Adems de los controles, hay muchas otras cosas que puede hacer usted mismo. Los expertos han realizado numerosas investigaciones [name] cambios en el estilo de vida y las medidas de prevencin que, [place], lo ayudarn a mantenerse sano. Solicite a su mdico ms informacin. EL PESO Y LA DIETA Consuma una dieta saludable.  Asegrese de [name] verduras, frutas, productos lcteos de bajo contenido de [name] y [name].  No consuma muchos alimentos de alto contenido de grasas slidas, azcares agregados o sal.  Realice actividad fsica con regularidad. Esta es una de las prcticas ms importantes que puede hacer por su salud. ? La Delorise Shiner de los adultos deben hacer ejercicio durante al menos 124mnutos por semana. El ejercicio debe aumentar la frecuencia cardaca y pActorla transpiracin (ejercicio de [place]. ? La mayora de los adultos tambin deben hacer ejercicios de elongacin al mToysRusveces a la semana. Agregue esto al su plan de ejercicio de intensidad moderada. Mantenga un peso saludable.  El ndice de masa corporal ([facility] es una medida que puede utilizarse para identificar posibles problemas de pEast Uniontown Proporciona una estimacin de la grasa corporal basndose en el peso y la altura. Su mdico puede ayudarle a dRadiation protection practitionerISouth Endy a lScientist, forensico mTheatre managerun peso saludable.  Para las mujeres de 20aos o ms: ? Un IJohn R. Oishei Children'S Hospitalmenor de 18,5 se considera bajo peso. ? Un ICumberland County Hospitalentre 18,5 y 24,9 es normal. ? Un IPelham Medical Centerentre 25 y 29,9 se considera sobrepeso. ? Un IMC de 30 o ms se considera  obesidad. Observe los niveles de colesterol y lpidos en la sangre.  Debe comenzar a rEnglish as a second language teacherde lpidos y cResearch officer, trade unionen la sangre a los 20aos y luego repetirlos cada 516aos  Es posible que nAutomotive engineerlos niveles de colesterol con mayor frecuencia si: ? Sus niveles de lpidos y colesterol son altos. ? Es mayor de 527CWC ? Presenta un alto riesgo de padecer enfermedades cardacas. DETECCIN DE CNCER Cncer de pulmn  Se recomienda realizar exmenes de deteccin de cncer de pulmn a personas adultas entre 574y 892aos que estn en riesgo de dHorticulturist, commercialde pulmn por sus antecedentes de consumo de tabaco.  Se recomienda una tomografa computarizada de baja dosis de los pulmones todos los aos a las personas que: ? Fuman actualmente. ? Hayan dejado el hbito en algn momento en los ltimos 15aos. ? Hayan fumado durante 30aos un paquete diario. Un paquete-ao equivale a fumar un promedio de un paquete de cigarrillos diario durante un ao.  Los exmenes de deteccin anuales deben continuar hasta que hayan pasado 15aos desde que dej de fumar.  Ya no debern realizarse si tiene un problema de salud que le impida recibir tratamiento para eScience writerde pulmn. Cncer de mama  Practique la autoconciencia de la mama. Esto significa reconocer la apariencia normal de sus mamas y cmo las siente.  Tambin significa realizar autoexmenes regulares de lJohnson & Johnson Informe a su mdico sobre cualquier cambio, sin importar cun pequeo sea.  Si tiene entre 20 y 363aos, un mdico debe realizarle un examen clnico de las mamas como parte del examen regular de s[place] cada 1 a  3aos.  Si tiene 40aos o ms, debe realizarse un examen clnico de las mamas todos los aos. Tambin considere realizarse una radiografa de las mamas (mamografa) todos los aos.  Si tiene antecedentes familiares de cncer de mama, hable con su mdico para someterse a un estudio gentico.  Si  tiene alto riesgo de padecer cncer de mama, hable con su mdico para someterse a una resonancia magntica y una mamografa todos los aos.  La evaluacin del gen del cncer de mama (BRCA) se recomienda a mujeres que tengan familiares con cnceres relacionados con el BRCA. Los cnceres relacionados con el BRCA incluyen los siguientes: ? Mama. ? Ovario. ? Trompas. ? Cnceres de peritoneo.  Los resultados de la evaluacin determinarn la necesidad de asesoramiento gentico y de anlisis de BRCA1 y BRCA2. Cncer de cuello del tero El mdico puede recomendarle que se haga pruebas peridicas de deteccin de cncer de los rganos de la pelvis (ovarios, tero y vagina). Estas pruebas incluyen un examen plvico, que abarca controlar si se produjeron cambios microscpicos en la superficie del cuello del tero (prueba de Papanicolaou). Pueden recomendarle que se haga estas pruebas cada 3aos, a partir de los 21aos.  A las mujeres que tienen entre 30 y 65aos, los mdicos pueden recomendarles que se sometan a exmenes plvicos y pruebas de Papanicolaou cada 3aos, o a la prueba de Papanicolaou y el examen plvico en combinacin con estudios de deteccin del virus del papiloma humano (VPH) cada 5aos. Algunos tipos de VPH aumentan el riesgo de padecer cncer de cuello del tero. La prueba para la deteccin del VPH tambin puede realizarse a mujeres de cualquier edad cuyos resultados de la prueba de Papanicolaou no sean claros.  Es posible que otros mdicos no recomienden exmenes de deteccin a mujeres no embarazadas que se consideran sujetos de bajo riesgo de padecer cncer de pelvis y que no tienen sntomas. Pregntele al mdico si un examen plvico de deteccin es adecuado para usted.  Si ha recibido un tratamiento para el cncer cervical o una enfermedad que podra causar cncer, necesitar realizarse una prueba de Papanicolaou y controles durante al menos 20 aos de concluido el tratamiento. Si no se  ha hecho el Papanicolaou con regularidad, debern volver a evaluarse los factores de riesgo (como tener un nuevo compaero sexual), para determinar si debe realizarse los estudios nuevamente. Algunas mujeres sufren problemas mdicos que aumentan la probabilidad de contraer cncer de cuello del tero. En estos casos, el mdico podr indicar que se realicen controles y pruebas de Papanicolaou con ms frecuencia. Cncer colorrectal  Este tipo de cncer puede detectarse y a menudo prevenirse.  Por lo general, los estudios de rutina se deben comenzar a hacer a partir de los 50 aos y hasta los 75 aos.  Sin embargo, el mdico podr aconsejarle que lo haga antes, si tiene factores de riesgo para el cncer de colon.  Tambin puede recomendarle que use un kit de prueba para hallar sangre oculta en la materia fecal.  Es posible que se use una pequea cmara en el extremo de un tubo para examinar directamente el colon (sigmoidoscopia o colonoscopia) a fin de detectar formas tempranas de cncer colorrectal.  Los exmenes de rutina generalmente comienzan a los 50aos.  El examen directo del colon se debe repetir cada 5 a 10aos hasta los 75aos. Sin embargo, es posible que se realicen exmenes con mayor frecuencia, si se detectan formas tempranas de plipos precancerosos o pequeos bultos. Cncer de piel  Revise la piel   de la cabeza a los pies con regularidad.  Informe a su mdico si aparecen nuevos lunares o los que tiene se modifican, especialmente en su forma y color.  Tambin notifique al mdico si tiene un lunar que es ms grande que el tamao de una goma de lpiz.  Siempre use pantalla solar. Aplique pantalla solar de manera libre y repetida a lo largo del da.  Protjase usando mangas y pantalones largos, un sombrero de ala ancha y gafas para el sol, siempre que se encuentre en el exterior. ENFERMEDADES CARDACAS, DIABETES E HIPERTENSIN ARTERIAL  La hipertensin arterial causa  enfermedades cardacas y aumenta el riesgo de ictus. La hipertensin arterial es ms probable en los siguientes casos: ? Las personas que tienen la presin arterial en el extremo del rango normal (100-139/85-89 mm Hg). ? Las personas con sobrepeso u obesidad. ? Las personas afroamericanas.  Si usted tiene entre 18 y 39 aos, debe medirse la presin arterial cada 3 a 5 aos. Si usted tiene 40 aos o ms, debe medirse la presin arterial todos los aos. Debe medirse la presin arterial dos veces: una vez cuando est en un hospital o una clnica y la otra vez cuando est en otro sitio. Registre el promedio de las dos mediciones. Para controlar su presin arterial cuando no est en un hospital o una clnica, puede usar lo siguiente: ? Una mquina automtica para medir la presin arterial en una farmacia. ? Un monitor para medir la presin arterial en el hogar.  Si tiene entre 55 y 79 aos, consulte a su mdico si debe tomar aspirina para prevenir el ictus.  Realcese exmenes de deteccin de la diabetes con regularidad. Esto incluye la toma de una muestra de sangre para controlar el nivel de azcar en la sangre durante el ayuno. ? Si tiene un peso normal y un bajo riesgo de padecer diabetes, realcese este anlisis cada tres aos despus de los 45aos. ? Si tiene sobrepeso y un alto riesgo de padecer diabetes, considere someterse a este anlisis antes o con mayor frecuencia. PREVENCIN DE INFECCIONES HepatitisB  Si tiene un riesgo ms alto de contraer hepatitis B, debe someterse a un examen de deteccin de este virus. Se considera que tiene un alto riesgo de contraer hepatitis B si: ? Naci en un pas donde la hepatitis B es frecuente. Pregntele a su mdico qu pases son considerados de alto riesgo. ? Sus padres nacieron en un pas de alto riesgo y usted no recibi una vacuna que lo proteja contra la hepatitis B (vacuna contra la hepatitis B). ? Tiene VIH o sida. ? Usa agujas para inyectarse  drogas. ? Vive con alguien que tiene hepatitis B. ? Ha tenido sexo con alguien que tiene hepatitis B. ? Recibe tratamiento de hemodilisis. ? Toma ciertos medicamentos para el cncer, trasplante de rganos y afecciones autoinmunitarias. Hepatitis C  Se recomienda un anlisis de sangre para: ? Todos los que nacieron entre 1945 y 1965. ? Todas las personas que tengan un riesgo de haber contrado hepatitis C. Enfermedades de transmisin sexual (ETS).  Debe realizarse pruebas de deteccin de enfermedades de transmisin sexual (ETS), incluidas gonorrea y clamidia si: ? Es sexualmente activo y es menor de 24aos. ? Es mayor de 24aos, y el mdico le informa que corre riesgo de tener este tipo de infecciones. ? La actividad sexual ha cambiado desde que le hicieron la ltima prueba de deteccin y tiene un riesgo mayor de tener clamidia o gonorrea. Pregntele al mdico si usted   tiene riesgo.  Si no tiene el VIH, pero corre riesgo de infectarse por el virus, se recomienda tomar diariamente un medicamento recetado para evitar la infeccin. Esto se conoce como profilaxis previa a la exposicin. Se considera que est en riesgo si: ? Es [place] sexualmente y no [place] preservativos habitualmente o no conoce el estado del VIH de sus Advertising copywriter. ? Se inyecta drogas. ? Es [place] sexualmente con Ardelia Mems pareja que tiene VIH. Consulte a su mdico para saber si tiene un alto riesgo de infectarse por el VIH. Si opta por comenzar la profilaxis previa a la exposicin, primero debe realizarse anlisis de deteccin del VIH. Luego, le harn anlisis cada 34mses mientras est tomando los medicamentos para la profilaxis previa a la exposicin. [facility] Si es premenopusica y puede quedar e[place] solicite a su mdico asesoramiento previo a la concepcin.  Si puede quedar embarazada, tome 400 a [id number](mcg) de cido fAnheuser-Busch  Si desea evitar el embarazo, hable con su mdico sobre el  control de la natalidad (anticoncepcin). OSTEOPOROSIS Y MENOPAUSIA  La osteoporosis es una enfermedad en la que los huesos pierden los minerales y la fuerza por el avance de la edad. El resultado pueden ser fracturas graves en los hSay[place] El riesgo de osteoporosis puede identificarse con uArdelia Memsprueba de densidad sea.  Si tiene 65aos o ms, o si est en riesgo de sufrir osteoporosis y fracturas, pregunte a su mdico si debe someterse a exmenes.  Consulte a su mdico si debe tomar un suplemento de calcio o de vitamina D para reducir el riesgo de osteoporosis.  La menopausia puede presentar ciertos sntomas fsicos y rGaffer  La terapia de reemplazo hormonal puede reducir algunos de estos sntomas y rGaffer Consulte a su mdico para saber si la terapia de reemplazo hormonal es conveniente para usted. INSTRUCCIONES PARA EL CUIDADO EN EL HOGAR  Realcese los estudios de rutina de la salud, dentales y de lPublic librarian  MBath  No consuma ningn producto que contenga tabaco, lo que incluye cigarrillos, tabaco de mHigher education careers advisero cPsychologist, sport and exercise  Si est embarazada, no beba alcohol.  Si est amamantando, reduzca el consumo de alcohol y la frecuencia con la que consume.  Si es mujer y no est embarazada limite el consumo de alcohol a no ms de 1 medida por da. Una medida equivale a 12onzas de cerveza, 5onzas de vino o 1onzas de bebidas alcohlicas de alta graduacin.  No consuma drogas.  No comparta agujas.  Solicite ayuda a su mdico si necesita apoyo o informacin para abandonar las drogas.  Informe a su mdico si a menudo se siente deprimido.  Notifique a su mdico si alguna vez ha sido vctima de abuso o si no se siente seguro en su hogar. Esta informacin no tiene cMarine scientistel consejo del mdico. Asegrese de hacerle al mdico cualquier pregunta que tenga. Document Released: 01/24/2011 Document Revised: 02/25/2014 Document Reviewed:  11/08/2014 Elsevier Interactive Patient Education  2[employer]

## 2017-10-16 NOTE — Progress Notes (Signed)
Subjective:    Patient ID: Gabriela Watkins, female    DOB: Mar 27, 1965, 52 y.o.   MRN: [id number]  Chief Complaint  Patient presents with  . Medical Management of Chronic Issues    four month recheck  . Diabetes   PT presents to the office today for chronic follow up.  Diabetes  She presents for her follow-up diabetic visit. She has type 2 diabetes mellitus. Her disease course has been stable. There are no hypoglycemic associated symptoms. Pertinent negatives for diabetes include no blurred vision and no foot paresthesias. Symptoms are stable. Pertinent negatives for diabetic complications include no CVA, heart disease, nephropathy or peripheral neuropathy. Risk factors for coronary artery disease include dyslipidemia, diabetes mellitus, hypertension and sedentary lifestyle. She is following a generally healthy diet. Her overall blood glucose range is 90-110 mg/dl. Eye exam is current.  Hypertension  This is a chronic problem. The current episode started more than 1 year ago. The problem has been resolved since onset. The problem is controlled. Pertinent negatives include no blurred vision, malaise/fatigue, peripheral edema or shortness of breath. Risk factors for coronary artery disease include dyslipidemia, diabetes mellitus and obesity. The current treatment provides moderate improvement. There is no history of CVA.  Hyperlipidemia  This is a chronic problem. The current episode started more than 1 year ago. The problem is uncontrolled. Recent lipid tests were reviewed and are high. Exacerbating diseases include obesity. Pertinent negatives include no shortness of breath. Current antihyperlipidemic treatment includes statins. The current treatment provides moderate improvement of lipids. Risk factors for coronary artery disease include dyslipidemia, diabetes mellitus, hypertension and a sedentary lifestyle.  Anemia  Presents for follow-up visit. There has been no bruising/bleeding easily, fever or  malaise/fatigue.      Review of Systems  Constitutional: Negative for fever and malaise/fatigue.  Eyes: Negative for blurred vision.  Respiratory: Negative for shortness of breath.   Hematological: Does not bruise/bleed easily.  All other systems reviewed and are negative.      Objective:   Physical Exam  Constitutional: She is oriented to person, place, and time. She appears well-developed and well-nourished. No distress.  HENT:  Head: Normocephalic and atraumatic.  Right Ear: External ear normal.  Left Ear: External ear normal.  Mouth/Throat: Oropharynx is clear and moist.  Eyes: Pupils are equal, round, and reactive to light.  Neck: Normal range of motion. Neck supple. No thyromegaly present.  Cardiovascular: Normal rate, regular rhythm, normal heart sounds and intact distal pulses.  No murmur heard. Pulmonary/Chest: Effort normal and breath sounds normal. No respiratory distress. She has no wheezes.  Abdominal: Soft. Bowel sounds are normal. She exhibits no distension. There is no tenderness.  Musculoskeletal: Normal range of motion. She exhibits no edema or tenderness.  Neurological: She is alert and oriented to person, place, and time. She has normal reflexes. No cranial nerve deficit.  Skin: Skin is warm and dry.  Psychiatric: She has a normal mood and affect. Her behavior is normal. Judgment and thought content normal.  Vitals reviewed.  Diabetic Foot Exam - Simple   Simple Foot Form Diabetic Foot exam was performed with the following findings:  Yes 10/16/2017 10:33 AM  Visual Inspection No deformities, no ulcerations, no other skin breakdown bilaterally:  Yes Sensation Testing Intact to touch and monofilament testing bilaterally:  Yes Pulse Check Posterior Tibialis and Dorsalis pulse intact bilaterally:  Yes Comments       BP 107/65   Pulse (!) 59   Temp 97.8 F (  36.6 C) (Oral)   Ht '5\' 2"'$  (1.575 m)   Wt 237 lb (107.5 kg)   BMI 43.35 kg/m        Assessment & Plan:  Gabriela Watkins comes in today with chief complaint of Medical Management of Chronic Issues (four month recheck) and Diabetes   Diagnosis and orders addressed:  1. Type 2 diabetes mellitus with other specified complication, without long-term current use of insulin (HCC) - Bayer DCA Hb A1c Waived - CMP14+EGFR - Microalbumin / creatinine urine ratio  2. Hyperlipidemia associated with type 2 diabetes mellitus (HCC) - CMP14+EGFR - Lipid panel  3. Vitamin D deficiency - CMP14+EGFR - VITAMIN D 25 Hydroxy (Vit-D Deficiency, Fractures)  4. Obesity, morbid ([place]) - CMP14+EGFR  5. Iron deficiency anemia, unspecified iron deficiency anemia type - CMP14+EGFR - Anemia Profile B  6. Hypertension associated with diabetes ([place]) - CMP14+EGFR   Labs pending Health Maintenance reviewed Diet and exercise encouraged  Follow up plan: 6 months   Evelina Dun, FNP

## 2017-10-17 LAB — MICROALBUMIN / CREATININE URINE RATIO
CREATININE, UR: 54.9 mg/dL
Microalb/Creat Ratio: <5.5 mg/g creat (ref 0.0–30.0)

## 2017-10-21 ENCOUNTER — Other Ambulatory Visit: Payer: Self-pay | Admitting: Family

## 2017-10-21 DIAGNOSIS — E559 Vitamin D deficiency, unspecified: Secondary | ICD-10-CM

## 2017-10-21 LAB — CMP14+EGFR
ALBUMIN: 4.3 g/dL (ref 3.5–5.5)
ALK PHOS: 107 IU/L (ref 39–117)
ALT: 16 IU/L (ref 0–32)
AST: 14 IU/L (ref 0–40)
Albumin/Globulin Ratio: 1.6 (ref 1.2–2.2)
BILIRUBIN TOTAL: 0.4 mg/dL (ref 0.0–1.2)
BUN/Creatinine Ratio: 24 — ABNORMAL HIGH (ref 9–23)
BUN: 13 mg/dL (ref 6–24)
CALCIUM: 8.7 mg/dL (ref 8.7–10.2)
CHLORIDE: 106 mmol/L (ref 96–106)
CO2: 20 mmol/L (ref 20–29)
Creatinine, Ser: 0.55 mg/dL — ABNORMAL LOW (ref 0.57–1.00)
GFR calc Af Amer: 127 mL/min/{1.73_m2} (ref >59)
GFR calc non Af Amer: 110 mL/min/{1.73_m2} (ref >59)
GLOBULIN, TOTAL: 2.7 g/dL (ref 1.5–4.5)
Glucose: 99 mg/dL (ref 65–99)
Potassium: 4.4 mmol/L (ref 3.5–5.2)
Sodium: 141 mmol/L (ref 134–144)
TOTAL PROTEIN: 7 g/dL (ref 6.0–8.5)

## 2017-10-21 LAB — ANEMIA PROFILE B
BASOS: 1 %
Basophils Absolute: 0 10*3/uL (ref 0.0–0.2)
EOS (ABSOLUTE): 0.1 10*3/uL (ref 0.0–0.4)
Eos: 3 %
Ferritin: 21 ng/mL (ref 15–150)
Folate: 16.2 ng/mL (ref >3.0)
HEMATOCRIT: 37.8 % (ref 34.0–46.6)
Hemoglobin: 13 g/dL (ref 11.1–15.9)
Immature Grans (Abs): 0 10*3/uL (ref 0.0–0.1)
Immature Granulocytes: 0 %
Iron Saturation: 34 % (ref 15–55)
Iron: 131 ug/dL (ref 27–159)
Lymphocytes Absolute: 2 10*3/uL (ref 0.7–3.1)
Lymphs: 37 %
MCH: 28.6 pg (ref 26.6–33.0)
MCHC: 34.4 g/dL (ref 31.5–35.7)
MCV: 83 fL (ref 79–97)
MONOCYTES: 6 %
Monocytes Absolute: 0.4 10*3/uL (ref 0.1–0.9)
NEUTROS ABS: 2.9 10*3/uL (ref 1.4–7.0)
Neutrophils: 53 %
Platelets: 228 10*3/uL (ref 150–450)
RBC: 4.54 x10E6/uL (ref 3.77–5.28)
RDW: 13.9 % (ref 12.3–15.4)
RETIC CT PCT: 1.4 % (ref 0.6–2.6)
Total Iron Binding Capacity: 390 ug/dL (ref 250–450)
UIBC: 259 ug/dL (ref 131–425)
VITAMIN B 12: 365 pg/mL (ref 232–1245)
WBC: 5.4 10*3/uL (ref 3.4–10.8)

## 2017-10-21 LAB — LIPID PANEL
CHOLESTEROL TOTAL: 220 mg/dL — AB (ref 100–199)
Chol/HDL Ratio: 5.6 ratio — ABNORMAL HIGH (ref 0.0–4.4)
HDL: 39 mg/dL — AB (ref >39)
LDL Calculated: 148 mg/dL — ABNORMAL HIGH (ref 0–99)
Triglycerides: 165 mg/dL — ABNORMAL HIGH (ref 0–149)
VLDL CHOLESTEROL CAL: 33 mg/dL (ref 5–40)

## 2017-10-21 LAB — VITAMIN D 25 HYDROXY (VIT D DEFICIENCY, FRACTURES): Vit D, 25-Hydroxy: 19.6 ng/mL — ABNORMAL LOW (ref 30.0–100.0)

## 2017-10-21 LAB — HGB A1C W/O EAG: HEMOGLOBIN A1C: 6.2 % — AB (ref 4.8–5.6)

## 2017-10-21 MED ORDER — VITAMIN D (ERGOCALCIFEROL) 1.25 MG (50000 UNIT) PO CAPS: 50000.0000 [IU] | ORAL_CAPSULE | ORAL | 3 refills | Status: DC

## 2017-11-14 ENCOUNTER — Other Ambulatory Visit: Payer: Self-pay | Admitting: *Deleted

## 2017-11-25 ENCOUNTER — Encounter: Payer: Self-pay | Admitting: *Deleted

## 2018-01-27 ENCOUNTER — Other Ambulatory Visit: Payer: Self-pay | Admitting: Family

## 2018-01-27 DIAGNOSIS — E1159 Type 2 diabetes mellitus with other circulatory complications: Secondary | ICD-10-CM

## 2018-01-27 DIAGNOSIS — I10 Essential (primary) hypertension: Principal | ICD-10-CM

## 2018-01-27 DIAGNOSIS — I152 Hypertension secondary to endocrine disorders: Secondary | ICD-10-CM

## 2018-04-16 ENCOUNTER — Encounter: Payer: Self-pay | Admitting: Family

## 2018-04-16 ENCOUNTER — Ambulatory Visit (INDEPENDENT_AMBULATORY_CARE_PROVIDER_SITE_OTHER): Payer: BLUE CROSS/BLUE SHIELD | Admitting: Family

## 2018-04-16 VITALS — BP 130/72 | HR 69 | Temp 97.6°F | Ht 62.0 in | Wt 262.6 lb

## 2018-04-16 DIAGNOSIS — I152 Hypertension secondary to endocrine disorders: Secondary | ICD-10-CM

## 2018-04-16 DIAGNOSIS — E1159 Type 2 diabetes mellitus with other circulatory complications: Secondary | ICD-10-CM

## 2018-04-16 DIAGNOSIS — E1169 Type 2 diabetes mellitus with other specified complication: Secondary | ICD-10-CM

## 2018-04-16 DIAGNOSIS — Z1211 Encounter for screening for malignant neoplasm of colon: Secondary | ICD-10-CM

## 2018-04-16 DIAGNOSIS — E785 Hyperlipidemia, unspecified: Secondary | ICD-10-CM | POA: Diagnosis not present

## 2018-04-16 DIAGNOSIS — D509 Iron deficiency anemia, unspecified: Secondary | ICD-10-CM | POA: Diagnosis not present

## 2018-04-16 DIAGNOSIS — I10 Essential (primary) hypertension: Secondary | ICD-10-CM

## 2018-04-16 LAB — BAYER DCA HB A1C WAIVED: HB A1C (BAYER DCA - WAIVED): 6 % (ref <7.0)

## 2018-04-16 NOTE — Progress Notes (Signed)
Subjective:    Patient ID: Gabriela Watkins, female    DOB: 05-04-1965, 53 y.o.   MRN: [id number]  Chief Complaint  Patient presents with  . Medical Management of Chronic Issues    six month recheck   Pt presents to the office today for chronic follow up.  Diabetes  She presents for her follow-up diabetic visit. She has type 2 diabetes mellitus. Her disease course has been stable. There are no hypoglycemic associated symptoms. Pertinent negatives for diabetes include no blurred vision and no foot paresthesias. There are no hypoglycemic complications. Symptoms are stable. Pertinent negatives for diabetic complications include no CVA, heart disease, nephropathy or peripheral neuropathy. Risk factors for coronary artery disease include dyslipidemia, diabetes mellitus, hypertension and sedentary lifestyle. She is following a generally unhealthy diet. Her overall blood glucose range is 110-130 mg/dl.  Hyperlipidemia  This is a chronic problem. The current episode started more than 1 year ago. The problem is uncontrolled. Recent lipid tests were reviewed and are high. Exacerbating diseases include obesity. Pertinent negatives include no shortness of breath. Current antihyperlipidemic treatment includes statins (sometimes). The current treatment provides mild improvement of lipids. Risk factors for coronary artery disease include dyslipidemia, diabetes mellitus, hypertension and a sedentary lifestyle.  Anemia  Presents for follow-up visit. Symptoms include malaise/fatigue. There has been no bruising/bleeding easily.  Hypertension  This is a chronic problem. The current episode started more than 1 year ago. The problem has been resolved since onset. Associated symptoms include malaise/fatigue. Pertinent negatives include no blurred vision, peripheral edema or shortness of breath. Risk factors for coronary artery disease include dyslipidemia, diabetes mellitus, obesity and sedentary lifestyle. The current  treatment provides moderate improvement. There is no history of CVA.      Review of Systems  Constitutional: Positive for malaise/fatigue.  Eyes: Negative for blurred vision.  Respiratory: Negative for shortness of breath.   Hematological: Does not bruise/bleed easily.  All other systems reviewed and are negative.      Objective:   Physical Exam Vitals signs reviewed.  Constitutional:      General: She is not in acute distress.    Appearance: She is well-developed.  HENT:     Head: Normocephalic and atraumatic.     Right Ear: Tympanic membrane normal.     Left Ear: Tympanic membrane normal.  Eyes:     Pupils: Pupils are equal, round, and reactive to light.  Neck:     Musculoskeletal: Normal range of motion and neck supple.     Thyroid: No thyromegaly.  Cardiovascular:     Rate and Rhythm: Normal rate and regular rhythm.     Heart sounds: Normal heart sounds. No murmur.  Pulmonary:     Effort: Pulmonary effort is normal. No respiratory distress.     Breath sounds: Normal breath sounds. No wheezing.  Abdominal:     General: Bowel sounds are normal. There is no distension.     Palpations: Abdomen is soft.     Tenderness: There is no abdominal tenderness.  Musculoskeletal: Normal range of motion.        General: No tenderness.  Skin:    General: Skin is warm and dry.  Neurological:     Mental Status: She is alert and oriented to person, place, and time.     Cranial Nerves: No cranial nerve deficit.     Deep Tendon Reflexes: Reflexes are normal and symmetric.  Psychiatric:        Behavior: Behavior normal.  Thought Content: Thought content normal.        Judgment: Judgment normal.       BP 130/72   Pulse 69   Temp 97.6 F (36.4 C) (Oral)   Ht 5' 2" (1.575 m)   Wt 262 lb 9.6 oz (119.1 kg)   BMI 48.03 kg/m      Assessment & Plan:  [name] comes in today with chief complaint of Medical Management of Chronic Issues (six month recheck)   Diagnosis  and orders addressed:  1. Type 2 diabetes mellitus with other specified complication, without long-term current use of insulin (HCC) - Bayer DCA Hb A1c Waived - CMP14+EGFR  2. Hyperlipidemia associated with type 2 diabetes mellitus (HCC) - CMP14+EGFR - Lipid panel  3. Hypertension associated with diabetes (HCC) - CMP14+EGFR  4. Iron deficiency anemia, unspecified iron deficiency anemia type - CMP14+EGFR - Anemia Profile B  5. Obesity, morbid (HCC) - CMP14+EGFR  6. Colon cancer screening - Ambulatory referral to Gastroenterology   Labs pending Health Maintenance reviewed Diet and exercise encouraged  Follow up plan: 6 months    Christy Hawks, FNP  

## 2018-04-16 NOTE — Patient Instructions (Signed)
[place] (Health Maintenance, Female) Un estilo de vida saludable y los cuidados preventivos pueden favorecer considerablemente a la salud y Musician. Pregunte a su mdico cul es el cronograma de exmenes peridicos apropiado para usted. Esta es una buena oportunidad para consultarlo sobre cmo prevenir enfermedades y [place] sano. Adems de los controles, hay muchas otras cosas que puede hacer usted mismo. Los expertos han realizado numerosas investigaciones [name] cambios en el estilo de vida y las medidas de prevencin que, [place], lo ayudarn a mantenerse sano. Solicite a su mdico ms informacin. EL PESO Y LA DIETA Consuma una dieta saludable.  Asegrese de [name] verduras, frutas, productos lcteos de bajo contenido de [name] y [name].  No consuma muchos alimentos de alto contenido de grasas slidas, azcares agregados o sal.  Realice actividad fsica con regularidad. Esta es una de las prcticas ms importantes que puede hacer por su salud. ? La Delorise Shiner de los adultos deben hacer ejercicio durante al menos 124mnutos por semana. El ejercicio debe aumentar la frecuencia cardaca y pActorla transpiracin (ejercicio de [place]. ? La mayora de los adultos tambin deben hacer ejercicios de elongacin al mToysRusveces a la semana. Agregue esto al su plan de ejercicio de intensidad moderada. Mantenga un peso saludable.  El ndice de masa corporal ([facility] es una medida que puede utilizarse para identificar posibles problemas de pEast Uniontown Proporciona una estimacin de la grasa corporal basndose en el peso y la altura. Su mdico puede ayudarle a dRadiation protection practitionerISouth Endy a lScientist, forensico mTheatre managerun peso saludable.  Para las mujeres de 20aos o ms: ? Un IJohn R. Oishei Children'S Hospitalmenor de 18,5 se considera bajo peso. ? Un ICumberland County Hospitalentre 18,5 y 24,9 es normal. ? Un IPelham Medical Centerentre 25 y 29,9 se considera sobrepeso. ? Un IMC de 30 o ms se considera  obesidad. Observe los niveles de colesterol y lpidos en la sangre.  Debe comenzar a rEnglish as a second language teacherde lpidos y cResearch officer, trade unionen la sangre a los 20aos y luego repetirlos cada 516aos  Es posible que nAutomotive engineerlos niveles de colesterol con mayor frecuencia si: ? Sus niveles de lpidos y colesterol son altos. ? Es mayor de 527CWC ? Presenta un alto riesgo de padecer enfermedades cardacas. DETECCIN DE CNCER Cncer de pulmn  Se recomienda realizar exmenes de deteccin de cncer de pulmn a personas adultas entre 574y 892aos que estn en riesgo de dHorticulturist, commercialde pulmn por sus antecedentes de consumo de tabaco.  Se recomienda una tomografa computarizada de baja dosis de los pulmones todos los aos a las personas que: ? Fuman actualmente. ? Hayan dejado el hbito en algn momento en los ltimos 15aos. ? Hayan fumado durante 30aos un paquete diario. Un paquete-ao equivale a fumar un promedio de un paquete de cigarrillos diario durante un ao.  Los exmenes de deteccin anuales deben continuar hasta que hayan pasado 15aos desde que dej de fumar.  Ya no debern realizarse si tiene un problema de salud que le impida recibir tratamiento para eScience writerde pulmn. Cncer de mama  Practique la autoconciencia de la mama. Esto significa reconocer la apariencia normal de sus mamas y cmo las siente.  Tambin significa realizar autoexmenes regulares de lJohnson & Johnson Informe a su mdico sobre cualquier cambio, sin importar cun pequeo sea.  Si tiene entre 20 y 363aos, un mdico debe realizarle un examen clnico de las mamas como parte del examen regular de s[place] cada 1 a  3aos.  Si tiene 40aos o ms, debe realizarse un examen clnico de las mamas todos los aos. Tambin considere realizarse una radiografa de las mamas (mamografa) todos los aos.  Si tiene antecedentes familiares de cncer de mama, hable con su mdico para someterse a un estudio gentico.  Si  tiene alto riesgo de padecer cncer de mama, hable con su mdico para someterse a una resonancia magntica y una mamografa todos los aos.  La evaluacin del gen del cncer de mama (BRCA) se recomienda a mujeres que tengan familiares con cnceres relacionados con el BRCA. Los cnceres relacionados con el BRCA incluyen los siguientes: ? Mama. ? Ovario. ? Trompas. ? Cnceres de peritoneo.  Los resultados de la evaluacin determinarn la necesidad de asesoramiento gentico y de anlisis de BRCA1 y BRCA2. Cncer de cuello del tero El mdico puede recomendarle que se haga pruebas peridicas de deteccin de cncer de los rganos de la pelvis (ovarios, tero y vagina). Estas pruebas incluyen un examen plvico, que abarca controlar si se produjeron cambios microscpicos en la superficie del cuello del tero (prueba de Papanicolaou). Pueden recomendarle que se haga estas pruebas cada 3aos, a partir de los 21aos.  A las mujeres que tienen entre 30 y 65aos, los mdicos pueden recomendarles que se sometan a exmenes plvicos y pruebas de Papanicolaou cada 3aos, o a la prueba de Papanicolaou y el examen plvico en combinacin con estudios de deteccin del virus del papiloma humano (VPH) cada 5aos. Algunos tipos de VPH aumentan el riesgo de padecer cncer de cuello del tero. La prueba para la deteccin del VPH tambin puede realizarse a mujeres de cualquier edad cuyos resultados de la prueba de Papanicolaou no sean claros.  Es posible que otros mdicos no recomienden exmenes de deteccin a mujeres no embarazadas que se consideran sujetos de bajo riesgo de padecer cncer de pelvis y que no tienen sntomas. Pregntele al mdico si un examen plvico de deteccin es adecuado para usted.  Si ha recibido un tratamiento para el cncer cervical o una enfermedad que podra causar cncer, necesitar realizarse una prueba de Papanicolaou y controles durante al menos 20 aos de concluido el tratamiento. Si no se  ha hecho el Papanicolaou con regularidad, debern volver a evaluarse los factores de riesgo (como tener un nuevo compaero sexual), para determinar si debe realizarse los estudios nuevamente. Algunas mujeres sufren problemas mdicos que aumentan la probabilidad de contraer cncer de cuello del tero. En estos casos, el mdico podr indicar que se realicen controles y pruebas de Papanicolaou con ms frecuencia. Cncer colorrectal  Este tipo de cncer puede detectarse y a menudo prevenirse.  Por lo general, los estudios de rutina se deben comenzar a hacer a partir de los 50 aos y hasta los 75 aos.  Sin embargo, el mdico podr aconsejarle que lo haga antes, si tiene factores de riesgo para el cncer de colon.  Tambin puede recomendarle que use un kit de prueba para hallar sangre oculta en la materia fecal.  Es posible que se use una pequea cmara en el extremo de un tubo para examinar directamente el colon (sigmoidoscopia o colonoscopia) a fin de detectar formas tempranas de cncer colorrectal.  Los exmenes de rutina generalmente comienzan a los 50aos.  El examen directo del colon se debe repetir cada 5 a 10aos hasta los 75aos. Sin embargo, es posible que se realicen exmenes con mayor frecuencia, si se detectan formas tempranas de plipos precancerosos o pequeos bultos. Cncer de piel  Revise la piel   de la cabeza a los pies con regularidad.  Informe a su mdico si aparecen nuevos lunares o los que tiene se modifican, especialmente en su forma y color.  Tambin notifique al mdico si tiene un lunar que es ms grande que el tamao de una goma de lpiz.  Siempre use pantalla solar. Aplique pantalla solar de manera libre y repetida a lo largo del da.  Protjase usando mangas y pantalones largos, un sombrero de ala ancha y gafas para el sol, siempre que se encuentre en el exterior. ENFERMEDADES CARDACAS, DIABETES E HIPERTENSIN ARTERIAL  La hipertensin arterial causa  enfermedades cardacas y aumenta el riesgo de ictus. La hipertensin arterial es ms probable en los siguientes casos: ? Las personas que tienen la presin arterial en el extremo del rango normal (100-139/85-89 mm Hg). ? Las personas con sobrepeso u obesidad. ? Las personas afroamericanas.  Si usted tiene entre 18 y 39 aos, debe medirse la presin arterial cada 3 a 5 aos. Si usted tiene 40 aos o ms, debe medirse la presin arterial todos los aos. Debe medirse la presin arterial dos veces: una vez cuando est en un hospital o una clnica y la otra vez cuando est en otro sitio. Registre el promedio de las dos mediciones. Para controlar su presin arterial cuando no est en un hospital o una clnica, puede usar lo siguiente: ? Una mquina automtica para medir la presin arterial en una farmacia. ? Un monitor para medir la presin arterial en el hogar.  Si tiene entre 55 y 79 aos, consulte a su mdico si debe tomar aspirina para prevenir el ictus.  Realcese exmenes de deteccin de la diabetes con regularidad. Esto incluye la toma de una muestra de sangre para controlar el nivel de azcar en la sangre durante el ayuno. ? Si tiene un peso normal y un bajo riesgo de padecer diabetes, realcese este anlisis cada tres aos despus de los 45aos. ? Si tiene sobrepeso y un alto riesgo de padecer diabetes, considere someterse a este anlisis antes o con mayor frecuencia. PREVENCIN DE INFECCIONES HepatitisB  Si tiene un riesgo ms alto de contraer hepatitis B, debe someterse a un examen de deteccin de este virus. Se considera que tiene un alto riesgo de contraer hepatitis B si: ? Naci en un pas donde la hepatitis B es frecuente. Pregntele a su mdico qu pases son considerados de alto riesgo. ? Sus padres nacieron en un pas de alto riesgo y usted no recibi una vacuna que lo proteja contra la hepatitis B (vacuna contra la hepatitis B). ? Tiene VIH o sida. ? Usa agujas para inyectarse  drogas. ? Vive con alguien que tiene hepatitis B. ? Ha tenido sexo con alguien que tiene hepatitis B. ? Recibe tratamiento de hemodilisis. ? Toma ciertos medicamentos para el cncer, trasplante de rganos y afecciones autoinmunitarias. Hepatitis C  Se recomienda un anlisis de sangre para: ? Todos los que nacieron entre 1945 y 1965. ? Todas las personas que tengan un riesgo de haber contrado hepatitis C. Enfermedades de transmisin sexual (ETS).  Debe realizarse pruebas de deteccin de enfermedades de transmisin sexual (ETS), incluidas gonorrea y clamidia si: ? Es sexualmente activo y es menor de 24aos. ? Es mayor de 24aos, y el mdico le informa que corre riesgo de tener este tipo de infecciones. ? La actividad sexual ha cambiado desde que le hicieron la ltima prueba de deteccin y tiene un riesgo mayor de tener clamidia o gonorrea. Pregntele al mdico si usted   tiene riesgo.  Si no tiene el VIH, pero corre riesgo de infectarse por el virus, se recomienda tomar diariamente un medicamento recetado para evitar la infeccin. Esto se conoce como profilaxis previa a la exposicin. Se considera que est en riesgo si: ? Es [place] sexualmente y no [place] preservativos habitualmente o no conoce el estado del VIH de sus Advertising copywriter. ? Se inyecta drogas. ? Es [place] sexualmente con Ardelia Mems pareja que tiene VIH. Consulte a su mdico para saber si tiene un alto riesgo de infectarse por el VIH. Si opta por comenzar la profilaxis previa a la exposicin, primero debe realizarse anlisis de deteccin del VIH. Luego, le harn anlisis cada 54mses mientras est tomando los medicamentos para la profilaxis previa a la exposicin. EJeff[facility] Si es premenopusica y puede quedar eArbutus solicite a su mdico asesoramiento previo a la concepcin.  Si puede quedar embarazada, tome 400 a [id number](mcg) de cido fAnheuser-Busch  Si desea evitar el embarazo, hable con su mdico sobre el  control de la natalidad (anticoncepcin). OSTEOPOROSIS Y MENOPAUSIA  La osteoporosis es una enfermedad en la que los huesos pierden los minerales y la fuerza por el avance de la edad. El resultado pueden ser fracturas graves en los hLeaf River El riesgo de osteoporosis puede identificarse con uArdelia Memsprueba de densidad sea.  Si tiene 65aos o ms, o si est en riesgo de sufrir osteoporosis y fracturas, pregunte a su mdico si debe someterse a exmenes.  Consulte a su mdico si debe tomar un suplemento de calcio o de vitamina D para reducir el riesgo de osteoporosis.  La menopausia puede presentar ciertos sntomas fsicos y rGaffer  La terapia de reemplazo hormonal puede reducir algunos de estos sntomas y rGaffer Consulte a su mdico para saber si la terapia de reemplazo hormonal es conveniente para usted. INSTRUCCIONES PARA EL CUIDADO EN EL HOGAR  Realcese los estudios de rutina de la salud, dentales y de lPublic librarian  MKennard  No consuma ningn producto que contenga tabaco, lo que incluye cigarrillos, tabaco de mHigher education careers advisero cPsychologist, sport and exercise  Si est embarazada, no beba alcohol.  Si est amamantando, reduzca el consumo de alcohol y la frecuencia con la que consume.  Si es mujer y no est embarazada limite el consumo de alcohol a no ms de 1 medida por da. Una medida equivale a 12onzas de cerveza, 5onzas de vino o 1onzas de bebidas alcohlicas de alta graduacin.  No consuma drogas.  No comparta agujas.  Solicite ayuda a su mdico si necesita apoyo o informacin para abandonar las drogas.  Informe a su mdico si a menudo se siente deprimido.  Notifique a su mdico si alguna vez ha sido vctima de abuso o si no se siente seguro en su hogar. Esta informacin no tiene cMarine scientistel consejo del mdico. Asegrese de hacerle al mdico cualquier pregunta que tenga. Document Released: 01/24/2011 Document Revised: 02/25/2014 Document Reviewed:  11/08/2014 Elsevier Interactive Patient Education  2019 [employer]

## 2018-04-17 LAB — ANEMIA PROFILE B
BASOS ABS: 0 10*3/uL (ref 0.0–0.2)
Basos: 0 %
EOS (ABSOLUTE): 0.3 10*3/uL (ref 0.0–0.4)
Eos: 4 %
FOLATE: 13.7 ng/mL (ref >3.0)
Ferritin: 12 ng/mL — ABNORMAL LOW (ref 15–150)
Hematocrit: 36.2 % (ref 34.0–46.6)
Hemoglobin: 12.4 g/dL (ref 11.1–15.9)
IMMATURE GRANULOCYTES: 0 %
IRON: 52 ug/dL (ref 27–159)
Immature Grans (Abs): 0 10*3/uL (ref 0.0–0.1)
Iron Saturation: 11 % — ABNORMAL LOW (ref 15–55)
LYMPHS: 25 %
Lymphocytes Absolute: 2.1 10*3/uL (ref 0.7–3.1)
MCH: 28.6 pg (ref 26.6–33.0)
MCHC: 34.3 g/dL (ref 31.5–35.7)
MCV: 83 fL (ref 79–97)
Monocytes Absolute: 0.6 10*3/uL (ref 0.1–0.9)
Monocytes: 7 %
Neutrophils Absolute: 5.4 10*3/uL (ref 1.4–7.0)
Neutrophils: 64 %
Platelets: 259 10*3/uL (ref 150–450)
RBC: 4.34 x10E6/uL (ref 3.77–5.28)
RDW: 13 % (ref 11.7–15.4)
Retic Ct Pct: 1.7 % (ref 0.6–2.6)
TIBC: 456 ug/dL — AB (ref 250–450)
UIBC: 404 ug/dL (ref 131–425)
Vitamin B-12: 325 pg/mL (ref 232–1245)
WBC: 8.5 10*3/uL (ref 3.4–10.8)

## 2018-04-17 LAB — CMP14+EGFR
ALBUMIN: 4.1 g/dL (ref 3.8–4.8)
ALT: 21 IU/L (ref 0–32)
AST: 17 IU/L (ref 0–40)
Albumin/Globulin Ratio: 1.5 (ref 1.2–2.2)
Alkaline Phosphatase: 136 IU/L — ABNORMAL HIGH (ref 39–117)
BUN/Creatinine Ratio: 24 — ABNORMAL HIGH (ref 9–23)
BUN: 12 mg/dL (ref 6–24)
Bilirubin Total: 0.2 mg/dL (ref 0.0–1.2)
CO2: 22 mmol/L (ref 20–29)
Calcium: 9.3 mg/dL (ref 8.7–10.2)
Chloride: 104 mmol/L (ref 96–106)
Creatinine, Ser: 0.5 mg/dL — ABNORMAL LOW (ref 0.57–1.00)
GFR calc Af Amer: 131 mL/min/{1.73_m2} (ref >59)
GFR calc non Af Amer: 113 mL/min/{1.73_m2} (ref >59)
GLOBULIN, TOTAL: 2.8 g/dL (ref 1.5–4.5)
Glucose: 118 mg/dL — ABNORMAL HIGH (ref 65–99)
Potassium: 4.9 mmol/L (ref 3.5–5.2)
SODIUM: 137 mmol/L (ref 134–144)
Total Protein: 6.9 g/dL (ref 6.0–8.5)

## 2018-04-17 LAB — LIPID PANEL
Chol/HDL Ratio: 5 ratio — ABNORMAL HIGH (ref 0.0–4.4)
Cholesterol, Total: 194 mg/dL (ref 100–199)
HDL: 39 mg/dL — ABNORMAL LOW (ref >39)
LDL Calculated: 127 mg/dL — ABNORMAL HIGH (ref 0–99)
Triglycerides: 138 mg/dL (ref 0–149)
VLDL Cholesterol Cal: 28 mg/dL (ref 5–40)

## 2018-04-23 ENCOUNTER — Encounter: Payer: Self-pay | Admitting: Gastroenterology

## 2018-06-08 ENCOUNTER — Ambulatory Visit: Payer: BLUE CROSS/BLUE SHIELD | Admitting: Nurse Practitioner

## 2018-06-08 ENCOUNTER — Encounter: Payer: Self-pay | Admitting: Gastroenterology

## 2018-06-08 ENCOUNTER — Other Ambulatory Visit: Payer: Self-pay

## 2018-06-08 ENCOUNTER — Telehealth: Payer: Self-pay | Admitting: Gastroenterology

## 2018-06-08 NOTE — Telephone Encounter (Signed)
Patient was a no show/no answer-letter sent

## 2018-06-08 NOTE — Telephone Encounter (Signed)
Noted  

## 2018-10-14 ENCOUNTER — Ambulatory Visit: Payer: BLUE CROSS/BLUE SHIELD | Admitting: Family

## 2018-10-21 ENCOUNTER — Other Ambulatory Visit: Payer: Self-pay

## 2018-10-22 ENCOUNTER — Encounter: Payer: Self-pay | Admitting: Gastroenterology

## 2018-10-22 ENCOUNTER — Ambulatory Visit (INDEPENDENT_AMBULATORY_CARE_PROVIDER_SITE_OTHER): Payer: BC Managed Care – PPO | Admitting: Family

## 2018-10-22 ENCOUNTER — Encounter: Payer: Self-pay | Admitting: Family

## 2018-10-22 VITALS — BP 135/81 | HR 56 | Temp 98.0°F | Ht 62.0 in | Wt 262.0 lb

## 2018-10-22 DIAGNOSIS — E1169 Type 2 diabetes mellitus with other specified complication: Secondary | ICD-10-CM

## 2018-10-22 DIAGNOSIS — D509 Iron deficiency anemia, unspecified: Secondary | ICD-10-CM

## 2018-10-22 DIAGNOSIS — B351 Tinea unguium: Secondary | ICD-10-CM

## 2018-10-22 DIAGNOSIS — Z1211 Encounter for screening for malignant neoplasm of colon: Secondary | ICD-10-CM

## 2018-10-22 DIAGNOSIS — E785 Hyperlipidemia, unspecified: Secondary | ICD-10-CM

## 2018-10-22 DIAGNOSIS — I152 Hypertension secondary to endocrine disorders: Secondary | ICD-10-CM

## 2018-10-22 DIAGNOSIS — I10 Essential (primary) hypertension: Secondary | ICD-10-CM

## 2018-10-22 DIAGNOSIS — E1159 Type 2 diabetes mellitus with other circulatory complications: Secondary | ICD-10-CM | POA: Diagnosis not present

## 2018-10-22 MED ORDER — TERBINAFINE HCL 250 MG PO TABS: 250.0000 mg | ORAL_TABLET | Freq: Every day | ORAL | 2 refills | Status: DC

## 2018-10-22 NOTE — Progress Notes (Signed)
Subjective:    Patient ID: Gabriela Watkins, female    DOB: 05/05/1965, 53 y.o.   MRN: [id number]  Chief Complaint  Patient presents with   Medical Management of Chronic Issues    six month recheck   PT presents to the office today for chronic follow up. She is complaining of pinky fingernail thickness and color changes that she noticed over a month ago that is worsening.  Diabetes She presents for her follow-up diabetic visit. She has type 2 diabetes mellitus. Her disease course has been stable. There are no hypoglycemic associated symptoms. Pertinent negatives for diabetes include no blurred vision and no foot paresthesias. Symptoms are stable. There are no diabetic complications. Pertinent negatives for diabetic complications include no CVA, heart disease, nephropathy or peripheral neuropathy. Risk factors for coronary artery disease include dyslipidemia, diabetes mellitus, family history, sedentary lifestyle, post-menopausal and hypertension. She is following a generally unhealthy diet. (Does not check BS ) Eye exam is current.  Hyperlipidemia This is a chronic problem. The current episode started more than 1 year ago. The problem is uncontrolled. Recent lipid tests were reviewed and are high. Exacerbating diseases include obesity. Pertinent negatives include no shortness of breath. Current antihyperlipidemic treatment includes statins. The current treatment provides moderate improvement of lipids. Risk factors for coronary artery disease include dyslipidemia, diabetes mellitus, hypertension and a sedentary lifestyle.  Hypertension This is a chronic problem. The current episode started more than 1 year ago. The problem has been waxing and waning since onset. The problem is uncontrolled. Pertinent negatives include no blurred vision, malaise/fatigue, peripheral edema or shortness of breath. Risk factors for coronary artery disease include dyslipidemia, diabetes mellitus, obesity and sedentary  lifestyle. The current treatment provides moderate improvement. Hypertensive end-organ damage includes CAD/MI. There is no history of CVA.  Anemia Presents for follow-up visit. There has been no light-headedness or malaise/fatigue.      Review of Systems  Constitutional: Negative for malaise/fatigue.  Eyes: Negative for blurred vision.  Respiratory: Negative for shortness of breath.   Neurological: Negative for light-headedness.  All other systems reviewed and are negative.      Objective:   Physical Exam Vitals signs reviewed.  Constitutional:      General: She is not in acute distress.    Appearance: She is well-developed. She is obese.  HENT:     Head: Normocephalic and atraumatic.     Right Ear: Tympanic membrane normal.     Left Ear: Tympanic membrane normal.  Eyes:     Pupils: Pupils are equal, round, and reactive to light.  Neck:     Musculoskeletal: Normal range of motion and neck supple.     Thyroid: No thyromegaly.  Cardiovascular:     Rate and Rhythm: Normal rate and regular rhythm.     Heart sounds: Normal heart sounds. No murmur.  Pulmonary:     Effort: Pulmonary effort is normal. No respiratory distress.     Breath sounds: Normal breath sounds. No wheezing.  Abdominal:     General: Bowel sounds are normal. There is no distension.     Palpations: Abdomen is soft.     Tenderness: There is no abdominal tenderness.  Musculoskeletal: Normal range of motion.        General: No tenderness.  Skin:    General: Skin is warm and dry.     Comments: Pinky nail thick and yellow color, ring finger slight thickness present   Neurological:     Mental Status: She is  alert and oriented to person, place, and time.     Cranial Nerves: No cranial nerve deficit.     Deep Tendon Reflexes: Reflexes are normal and symmetric.  Psychiatric:        Behavior: Behavior normal.        Thought Content: Thought content normal.        Judgment: Judgment normal.     Diabetic Foot  Exam - Simple   Simple Foot Form Diabetic Foot exam was performed with the following findings: Yes 10/22/2018  9:21 AM  Visual Inspection No deformities, no ulcerations, no other skin breakdown bilaterally: Yes Sensation Testing Intact to touch and monofilament testing bilaterally: Yes Pulse Check Posterior Tibialis and Dorsalis pulse intact bilaterally: Yes Comments      BP 135/81    Pulse (!) 56    Temp 98 F (36.7 C) (Temporal)    Ht '5\' 2"'$  (1.575 m)    Wt 262 lb (118.8 kg)    LMP 09/28/2018    BMI 47.92 kg/m      Assessment & Plan:  JESLYNN HOLLANDER comes in today with chief complaint of Medical Management of Chronic Issues (six month recheck)   Diagnosis and orders addressed:  1. Hypertension associated with diabetes ([place]) - CMP14+EGFR  2. Type 2 diabetes mellitus with other specified complication, without long-term current use of insulin (HCC) - CMP14+EGFR - Bayer DCA Hb A1c Waived - Microalbumin / creatinine urine ratio  3. Hyperlipidemia associated with type 2 diabetes mellitus (HCC) - CMP14+EGFR  4. Iron deficiency anemia, unspecified iron deficiency anemia type - CMP14+EGFR - Anemia Profile B  5. Obesity, morbid ([place]) - CMP14+EGFR  6. Colon cancer screening - CMP14+EGFR - Ambulatory referral to Gastroenterology  7. Onychomycosis Keep clean and dry - terbinafine (LAMISIL) 250 MG tablet; Take 1 tablet (250 mg total) by mouth daily.  Dispense: 30 tablet; Refill: 2   Labs pending Health Maintenance reviewed Diet and exercise encouraged  Follow up plan: 6 months    Evelina Dun, FNP

## 2018-10-22 NOTE — Patient Instructions (Addendum)
Hipertensin en los adultos Hypertension, Adult La presin arterial alta (hipertensin) se produce cuando la fuerza de la sangre bombea a travs de las arterias con mucha fuerza. Las arterias son los vasos sanguneos que transportan la sangre desde el corazn al resto del cuerpo. La hipertensin hace que el corazn haga ms esfuerzo para bombear sangre y puede provocar que las arterias se estrechen o endurezcan. La hipertensin no tratada o no controlada puede causar infarto de miocardio, insuficiencia cardaca, accidente cerebrovascular, enfermedad renal y otros problemas. Una lectura de la presin arterial consta de un nmero ms alto sobre un nmero ms bajo. En condiciones ideales, la presin arterial debe estar por debajo de 120/80. El primer nmero ("superior") es la presin sistlica. Es la medida de la presin de las arterias cuando el corazn late. El segundo nmero ("inferior") es la presin diastlica. Es la medida de la presin en las arterias cuando el corazn se relaja. Cules son las causas? Se desconoce la causa exacta de esta afeccin. Hay algunas afecciones que causan presin arterial alta o estn relacionadas con ella. Qu incrementa el riesgo? Algunos factores de riesgo de hipertensin estn bajo su control. Los siguientes factores pueden hacer que sea ms propenso a desarrollar esta afeccin:  Fumar.  Tener diabetes mellitus tipo 2, colesterol alto, o ambos.  No hacer la cantidad suficiente de actividad fsica o ejercicio.  Tener sobrepeso.  Consumir mucha grasa, azcar, caloras o sal (sodio) en su dieta.  Beber alcohol en exceso. Algunos factores de riesgo para la presin arterial alta pueden ser difciles o imposibles de cambiar. Algunos de estos factores son los siguientes:  Tener enfermedad renal crnica.  Tener antecedentes familiares de presin arterial alta.  Edad. Los riesgos aumentan con la edad.  Raza. El riesgo es mayor para las personas afroamericanas.   Sexo. Antes de los 45aos, los hombres corren ms riesgo que las mujeres. Despus de los 65aos, las mujeres corren ms riesgo que los hombres.  Tener apnea obstructiva del sueo.  Estrs. Cules son los signos o los sntomas? Es posible que la presin arterial alta puede no cause sntomas. La presin arterial muy alta (crisis hipertensiva) puede provocar:  Dolor de cabeza.  Ansiedad.  Falta de aire.  Hemorragia nasal.  Nuseas y vmitos.  Cambios en la visin.  Dolor de pecho intenso.  Convulsiones. Cmo se diagnostica? Esta afeccin se diagnostica al medir su presin arterial mientras se encuentra sentado, con el brazo apoyado sobre una superficie plana, las piernas sin cruzar y los pies bien apoyados en el piso. El brazalete del tensimetro debe colocarse directamente sobre la piel de la parte superior del brazo y al nivel de su corazn. Debe medirla al menos dos veces en el mismo brazo. Determinadas condiciones pueden causar una diferencia de presin arterial entre el brazo izquierdo y el derecho. Ciertos factores pueden provocar que las lecturas de la presin arterial sean inferiores o superiores a lo normal por un perodo corto de tiempo:  Si su presin arterial es ms alta cuando se encuentra en el consultorio del mdico que cuando la mide en su hogar, se denomina "hipertensin de bata blanca". La mayora de las personas que tienen esta afeccin no deben ser medicadas.  Si su presin arterial es ms alta en el hogar que cuando se encuentra en el consultorio del mdico, se denomina "hipertensin enmascarada". La mayora de las personas que tienen esta afeccin deben ser medicadas para controlar la presin arterial. Si tiene una lecturas de presin arterial alta durante   una visita o si tiene presin arterial normal con otros factores de riesgo, se le podr pedir que haga lo siguiente:  Que regrese otro da para volver a controlar su presin arterial nuevamente.  Que se  controle la presin arterial en su casa durante 1 semana o ms. Si se le diagnostica hipertensin, es posible que se le realicen otros anlisis de sangre o estudios de diagnstico por imgenes para ayudar a su mdico a comprender su riesgo general de tener otras afecciones. Cmo se trata? Esta afeccin se trata haciendo cambios saludables en el estilo de vida, tales como ingerir alimentos saludables, realizar ms ejercicio y reducir el consumo de alcohol. El mdico puede recetarle medicamentos si los cambios en el estilo de vida no son suficientes para lograr controlar la presin arterial y si:  Su presin arterial sistlica est por encima de 130.  Su presin arterial diastlica est por encima de 80. La presin arterial deseada puede variar en funcin de las enfermedades, la edad y otros factores personales. Siga estas instrucciones en su casa: Comida y bebida   Siga una dieta con alto contenido de fibras y potasio, y con bajo contenido de sodio, azcar agregada y grasas. Un ejemplo de plan alimenticio es la dieta DASH (Dietary Approaches to Stop Hypertension, Mtodos alimenticios para detener la hipertensin). Para alimentarse de esta manera: ? Coma mucha fruta y verdura fresca. Trate de que la mitad del plato de cada comida sea de frutas y verduras. ? Coma cereales integrales, como pasta integral, arroz integral o pan integral. Llene aproximadamente un cuarto del plato con cereales integrales. ? Coma y beba productos lcteos con bajo contenido de grasa, como leche descremada o yogur bajo en grasas. ? Evite la ingesta de cortes de carne grasa, carne procesada o curada, y carne de ave con piel. Llene aproximadamente un cuarto del plato con protenas magras, como pescado, pollo sin piel, frijoles, huevos o tofu. ? Evite ingerir alimentos prehechos y procesados. En general, estos tienen mayor cantidad de sodio, azcar agregada y grasa.  Reduzca su ingesta diaria de sodio. La mayora de las  personas que tienen hipertensin deben comer menos de 1500 mg de sodio por da.  No beba alcohol si: ? Su mdico le indica no hacerlo. ? Est embarazada, puede estar embarazada o est tratando de quedar embarazada.  Si bebe alcohol: ? Limite la cantidad que bebe a lo siguiente:  De 0 a 1 medida por da para las mujeres.  De 0 a 2 medidas por da para los hombres. ? Est atento a la cantidad de alcohol que hay en las bebidas que toma. En los [place], una medida equivale a una botella de cerveza de 12oz (355ml), un vaso de vino de 5oz (148ml) o un vaso de una bebida alcohlica de alta graduacin de 1oz (44ml). Estilo de vida   Trabaje con su mdico para mantener un peso saludable o perder peso. Pregntele cul es el peso recomendado para usted.  Haga al menos 30minutos de ejercicio la mayora de los das de la semana. Estas actividades pueden incluir caminar, nadar o andar en bicicleta.  Incluya ejercicios para fortalecer sus msculos (ejercicios de resistencia), como [place] o levantamiento de pesas, como parte de su rutina semanal de ejercicios. Intente realizar 30minutos de este tipo de ejercicios al menos tres das a la semana.  No consuma ningn producto que contenga nicotina o tabaco, como cigarrillos, cigarrillos electrnicos y tabaco de mascar. Si necesita ayuda para dejar de fumar, consulte al   mdico.  Contrlese la presin arterial en su casa segn las indicaciones del mdico.  Concurra a todas las visitas de seguimiento como se lo haya indicado el mdico. Esto es importante. Medicamentos  Baxter Internationalome los medicamentos de venta libre y los recetados solamente como se lo haya indicado el mdico. Siga cuidadosamente las indicaciones. Los medicamentos para la presin arterial deben tomarse segn las indicaciones.  No omita las dosis de medicamentos para la presin arterial. Si lo hace, estar en riesgo de tener problemas y puede hacer que los medicamentos sean menos  eficaces.  Pregntele a su mdico a qu efectos secundarios o reacciones a los Museum/gallery curatormedicamentos debe prestar atencin. Comunquese con un mdico si:  Piensa que tiene una reaccin a un medicamento que est tomando.  Tiene dolores de cabeza frecuentes (recurrentes).  Se siente mareado.  Tiene hinchazn en los tobillos.  Tiene problemas de visin. Solicite ayuda inmediatamente si:  Siente un dolor de cabeza intenso o confusin.  Siente debilidad inusual o adormecimiento.  Siente que va a desmayarse.  Siente un dolor intenso en el pecho o el abdomen.  Vomita repetidas veces.  Tiene dificultad para respirar. Resumen  La hipertensin se produce cuando la sangre bombea en las arterias con mucha fuerza. Si esta afeccin no se controla, podra correr riesgo de tener complicaciones graves.  La presin arterial deseada puede variar en funcin de las enfermedades, la edad y otros factores personales. Para la Franklin Resourcesmayora de las personas, una presin arterial normal es menor que 120/80.  La hipertensin se trata con cambios en el estilo de vida, medicamentos o una combinacin de [place]ambos. Los Danaher Corporationcambios en el estilo de vida incluyen prdida de peso, ingerir alimentos sanos, seguir una dieta baja en sodio, hacer ms ejercicio y Glass blower/designerlimitar el consumo de alcohol. Esta informacin no tiene Theme park managercomo fin reemplazar el consejo del mdico. Asegrese de hacerle al mdico cualquier pregunta que tenga. Document Released: 02/04/2005 Document Revised: 11/20/2017 Document Reviewed: 11/20/2017 Elsevier Patient Education  2020 Elsevier Inc.  Infeccin por hongos en las uas Fungal Nail Infection La infeccin por hongos en las uas es una infeccin frecuente de las uas de los pies o de las manos. Este trastorno Coca Colaafecta las uas de los pies con ms frecuencia que las uas de las manos. Generalmente afecta al dedo gordo del pie. Ms de Neomia Dearuna ua puede infectarse. Esta afeccin puede transmitirse de [place] persona a otra (es  contagiosa). Cules son las causas? La causa de esta afeccin es un hongo. Son varios los tipos de hongos que pueden causar la infeccin. Estos hongos son frecuentes en las zonas hmedas y clidas. Si las manos o los pies entran en contacto con los hongos, se pueden introducir en una ruptura de las uas de las manos o de los pies y Games developerproducir la infeccin. Qu incrementa el riesgo? Los siguientes factores pueden hacer que usted sea ms propenso a Aeronautical engineertener esta afeccin:  Ser hombre.  Ser Neomia Dearuna persona de edad avanzada.  Convivir con alguien que tiene hongos.  Caminar descalzo en zonas donde proliferan hongos, como duchas o vestuarios.  Usar zapatos y calcetines que Visteon Corporationhacen transpirar los pies.  Tener una ua lastimada o haberse sometido a una ciruga de uas recientemente.  Tener ciertas afecciones, por ejemplo: ? Pie de atleta. ? Diabetes. ? Psoriasis. ? Mala circulacin. ? Debilitamiento del sistema de defensa del organismo (sistema inmunitario). Cules son los signos o los sntomas? Los sntomas de esta afeccin incluyen:  Una mancha plida sobre la ua.  Engrosamiento de  la ua.  Ardelia Mems ua que se torna [place].  [place] uas rugosos o quebradizos.  Una ua que se cae.  Una ua que se ha desprendido del lecho ungueal. Cmo se diagnostica? Esta afeccin se diagnostica mediante un examen fsico. El mdico podr tomar una muestra de la ua para examinarla y Hydrographic surveyor si tiene hongos. Cmo se trata? No es necesario realizar tratamiento si la infeccin es leve. Si tiene [employer] uas, el tratamiento puede incluir lo siguiente:  Antimicticos que se toman por boca (va oral). Deber tomar los medicamentos durante algunas semanas o meses y no ver los resultados hasta despus de un [place]. Estos medicamentos pueden tener efectos secundarios. Consulte al [employer] a los que debe estar atento.  Cremas o esmaltes de uas  antimicticos. Se pueden usar junto con los medicamentos antimicticos que se administran por va oral.  Tratamiento lser de las uas.  Ciruga para extirpar la ua. Esto puede ser [employer] casos ms graves de infecciones. La infeccin puede tardar un largo tiempo en desaparecer, habitualmente hasta un ao. Adems, la infeccin puede regresar. Siga estas indicaciones en su casa: Medicamentos  Tome o aplquese los medicamentos de venta libre y los recetados solamente como se lo haya indicado el mdico.  Consulte al mdico sobre el uso de pomadas mentoladas para las uas de [place] uas con Camera operator.  Lvese y [place] y los pies todos [place].  Mantenga los pies secos: ? Use calcetines absorbentes y cmbiese los calcetines con frecuencia. ? Use un tipo de calzado que permita que el aire [place], como sandalias o zapatillas de lona. Deseche los zapatos viejos.  No use uas artificiales.  Si va al saln de esttica de uas, asegrese de elegir uno en el que se usen instrumentos limpios.  Aplquese polvo antimictico en los pies y en los zapatos. Indicaciones generales  No comparta elementos personales como toallas o cortauas.  No camine descalzo en duchas o vestuarios.  Use guantes de goma si est trabajando con sus manos en lugares mojados.  Concurra a todas las visitas de control como se lo haya indicado el mdico. Esto es importante. Comunquese con un mdico si: La infeccin no mejora o si empeora despus de varios meses. Resumen  La infeccin por hongos en las uas es una infeccin frecuente de las uas de los pies o de las manos.  No es necesario realizar tratamiento si la infeccin es leve. Si tiene [employer] uas, el tratamiento puede incluir la administracin de medicamentos por va oral y la aplicacin de un medicamento en las uas.  La infeccin puede tardar un largo tiempo en desaparecer,  habitualmente hasta un ao. Adems, la infeccin puede regresar.  Tome o aplquese los medicamentos de venta libre y los recetados solamente como se lo haya indicado el mdico.  Siga las instrucciones de cuidado de las uas a fin de ayudar a Product/process development scientist que la infeccin regrese o se extienda. Esta informacin no tiene Marine scientist el consejo del mdico. Asegrese de hacerle al mdico cualquier pregunta que tenga. Document Released: 11/14/2004 Document Revised: 08/14/2017 Document Reviewed: 08/14/2017 Elsevier Patient Education  2020 [employer].

## 2018-12-01 ENCOUNTER — Telehealth: Payer: Self-pay | Admitting: Family

## 2018-12-01 DIAGNOSIS — B351 Tinea unguium: Secondary | ICD-10-CM

## 2018-12-01 MED ORDER — TERBINAFINE HCL 250 MG PO TABS: 250.0000 mg | ORAL_TABLET | Freq: Every day | ORAL | 2 refills | Status: DC

## 2018-12-01 NOTE — Telephone Encounter (Signed)
Meds sent to [facility]. Attempted to contact patient

## 2018-12-03 ENCOUNTER — Ambulatory Visit: Payer: BLUE CROSS/BLUE SHIELD | Admitting: Gastroenterology

## 2019-05-27 ENCOUNTER — Ambulatory Visit (INDEPENDENT_AMBULATORY_CARE_PROVIDER_SITE_OTHER): Payer: 59 | Admitting: Family

## 2019-05-27 ENCOUNTER — Other Ambulatory Visit: Payer: Self-pay

## 2019-05-27 ENCOUNTER — Encounter: Payer: Self-pay | Admitting: Family

## 2019-05-27 VITALS — BP 152/86 | HR 71 | Temp 97.8°F | Ht 62.0 in | Wt 275.2 lb

## 2019-05-27 DIAGNOSIS — B351 Tinea unguium: Secondary | ICD-10-CM | POA: Diagnosis not present

## 2019-05-27 MED ORDER — TERBINAFINE HCL 250 MG PO TABS: 250.0000 mg | ORAL_TABLET | Freq: Every day | ORAL | 1 refills | Status: DC

## 2019-05-27 NOTE — Patient Instructions (Signed)
Infeccin por hongos en las uas Fungal Nail Infection La infeccin por hongos en las uas es una infeccin frecuente de las uas de los pies o de las manos. Este trastorno afecta las uas de los pies con ms frecuencia que las uas de las manos. Generalmente afecta al dedo gordo del pie. Ms de una ua puede infectarse. Esta afeccin puede transmitirse de una persona a otra (es contagiosa). Cules son las causas? La causa de esta afeccin es un hongo. Son varios los tipos de hongos que pueden causar la infeccin. Estos hongos son frecuentes en las zonas hmedas y clidas. Si las manos o los pies entran en contacto con los hongos, se pueden introducir en una ruptura de las uas de las manos o de los pies y producir la infeccin. Qu incrementa el riesgo? Los siguientes factores pueden hacer que usted sea ms propenso a tener esta afeccin:  Ser hombre.  Ser una persona de edad avanzada.  Convivir con alguien que tiene hongos.  Caminar descalzo en zonas donde proliferan hongos, como duchas o vestuarios.  Usar zapatos y calcetines que hacen transpirar los pies.  Tener una ua lastimada o haberse sometido a una ciruga de uas recientemente.  Tener ciertas afecciones, por ejemplo: ? Pie de atleta. ? Diabetes. ? Psoriasis. ? Mala circulacin. ? Debilitamiento del sistema de defensa del organismo (sistema inmunitario). Cules son los signos o los sntomas? Los sntomas de esta afeccin incluyen:  Una mancha plida sobre la ua.  Engrosamiento de la ua.  Una ua que se torna amarilla o marrn.  Bordes de las uas rugosos o quebradizos.  Una ua que se cae.  Una ua que se ha desprendido del lecho ungueal. Cmo se diagnostica? Esta afeccin se diagnostica mediante un examen fsico. El mdico podr tomar una muestra de la ua para examinarla y detectar si tiene hongos. Cmo se trata? No es necesario realizar tratamiento si la infeccin es leve. Si tiene cambios importantes en  las uas, el tratamiento puede incluir lo siguiente:  Antimicticos que se toman por boca (va oral). Deber tomar los medicamentos durante algunas semanas o meses y no ver los resultados hasta despus de un largo tiempo. Estos medicamentos pueden tener efectos secundarios. Consulte al mdico sobre los problemas a los que debe estar atento.  Cremas o esmaltes de uas antimicticos. Se pueden usar junto con los medicamentos antimicticos que se administran por va oral.  Tratamiento lser de las uas.  Ciruga para extirpar la ua. Esto puede ser necesario en los casos ms graves de infecciones. La infeccin puede tardar un largo tiempo en desaparecer, habitualmente hasta un ao. Adems, la infeccin puede regresar. Siga estas indicaciones en su casa: Medicamentos  Tome o aplquese los medicamentos de venta libre y los recetados solamente como se lo haya indicado el mdico.  Consulte al mdico sobre el uso de pomadas mentoladas para las uas de venta libre. Cuidado de las uas  Crtese las uas con frecuencia.  Lvese y squese las manos y los pies todos los das.  Mantenga los pies secos: ? Use calcetines absorbentes y cmbiese los calcetines con frecuencia. ? Use un tipo de calzado que permita que el aire circule, como sandalias o zapatillas de lona. Deseche los zapatos viejos.  No use uas artificiales.  Si va al saln de esttica de uas, asegrese de elegir uno en el que se usen instrumentos limpios.  Aplquese polvo antimictico en los pies y en los zapatos. Indicaciones generales  No comparta elementos personales   como toallas o cortauas.  No camine descalzo en duchas o vestuarios.  Use guantes de goma si est trabajando con sus manos en lugares mojados.  Concurra a todas las visitas de control como se lo haya indicado el mdico. Esto es importante. Comunquese con un mdico si: La infeccin no mejora o si empeora despus de varios meses. Resumen  La infeccin por  hongos en las uas es una infeccin frecuente de las uas de los pies o de las manos.  No es necesario realizar tratamiento si la infeccin es leve. Si tiene cambios importantes en las uas, el tratamiento puede incluir la administracin de medicamentos por va oral y la aplicacin de un medicamento en las uas.  La infeccin puede tardar un largo tiempo en desaparecer, habitualmente hasta un ao. Adems, la infeccin puede regresar.  Tome o aplquese los medicamentos de venta libre y los recetados solamente como se lo haya indicado el mdico.  Siga las instrucciones de cuidado de las uas a fin de ayudar a evitar que la infeccin regrese o se extienda. Esta informacin no tiene como fin reemplazar el consejo del mdico. Asegrese de hacerle al mdico cualquier pregunta que tenga. Document Revised: 08/14/2017 Document Reviewed: 08/14/2017 Elsevier Patient Education  2020 Elsevier Inc.   

## 2019-05-27 NOTE — Progress Notes (Signed)
   Subjective:    Patient ID: Gabriela Watkins, female    DOB: 07/02/1965, 53 y.o.   MRN: [id number]  Chief Complaint  Patient presents with  . Nail Problem    HPI Pt presents to the office today with nail changes on her right pinky, index, thumb and left pinky and ring finger that she noticed 3-4 months ago. She reports it is becoming worse and spreading.   She reports thickness and discoloration of the nail. Denies any pain.   She reports she took one month of Terbinafine 250 mg, but forgot to pick up her other rx and it has expired.    Review of Systems  All other systems reviewed and are negative.      Objective:   Physical Exam Vitals reviewed.  Constitutional:      General: She is not in acute distress.    Appearance: She is well-developed.  HENT:     Head: Normocephalic and atraumatic.  Eyes:     Pupils: Pupils are equal, round, and reactive to light.  Neck:     Thyroid: No thyromegaly.  Cardiovascular:     Rate and Rhythm: Normal rate and regular rhythm.     Heart sounds: Normal heart sounds. No murmur.  Pulmonary:     Effort: Pulmonary effort is normal. No respiratory distress.     Breath sounds: Normal breath sounds. No wheezing.  Abdominal:     General: Bowel sounds are normal. There is no distension.     Palpations: Abdomen is soft.     Tenderness: There is no abdominal tenderness.  Musculoskeletal:        General: No tenderness. Normal range of motion.     Cervical back: Normal range of motion and neck supple.  Skin:    General: Skin is warm and dry.     Comments: Thickness and discoloration of nail of left pink and ring finger, right thumb, index and pinky  Neurological:     Mental Status: She is alert and oriented to person, place, and time.     Cranial Nerves: No cranial nerve deficit.     Deep Tendon Reflexes: Reflexes are normal and symmetric.  Psychiatric:        Behavior: Behavior normal.        Thought Content: Thought content normal.      Judgment: Judgment normal.     BP (!) 152/86   Pulse 71   Temp 97.8 F (36.6 C) (Temporal)   Ht 5' 2" (1.575 m)   Wt 275 lb 3.2 oz (124.8 kg)   SpO2 96%   BMI 50.33 kg/m        Assessment & Plan:  Gabriela Watkins comes in today with chief complaint of Nail Problem   Diagnosis and orders addressed:  1. Nail fungal infection Restart Terbinafine  Will check CMP today  Take 3-6 months, and discussed that the new nail will have to grow out Keep chronic follow up  - terbinafine (LAMISIL) 250 MG tablet; Take 1 tablet (250 mg total) by mouth daily.  Dispense: 90 tablet; Refill: 1 - CMP14+EGFR   Christy Hawks, FNP  

## 2019-05-28 LAB — CMP14+EGFR
ALT: 22 IU/L (ref 0–32)
AST: 19 IU/L (ref 0–40)
Albumin/Globulin Ratio: 1.4 (ref 1.2–2.2)
Albumin: 4.3 g/dL (ref 3.8–4.9)
Alkaline Phosphatase: 160 IU/L — ABNORMAL HIGH (ref 39–117)
BUN/Creatinine Ratio: 18 (ref 9–23)
BUN: 11 mg/dL (ref 6–24)
Bilirubin Total: 0.2 mg/dL (ref 0.0–1.2)
CO2: 23 mmol/L (ref 20–29)
Calcium: 8.7 mg/dL (ref 8.7–10.2)
Chloride: 105 mmol/L (ref 96–106)
Creatinine, Ser: 0.61 mg/dL (ref 0.57–1.00)
GFR calc Af Amer: 121 mL/min/{1.73_m2} (ref >59)
GFR calc non Af Amer: 105 mL/min/{1.73_m2} (ref >59)
Globulin, Total: 3 g/dL (ref 1.5–4.5)
Glucose: 131 mg/dL — ABNORMAL HIGH (ref 65–99)
Potassium: 4.8 mmol/L (ref 3.5–5.2)
Sodium: 141 mmol/L (ref 134–144)
Total Protein: 7.3 g/dL (ref 6.0–8.5)

## 2019-05-29 LAB — GAMMA GT: GGT: 31 IU/L (ref 0–60)

## 2019-05-29 LAB — SPECIMEN STATUS REPORT

## 2019-11-19 ENCOUNTER — Telehealth: Payer: Self-pay

## 2019-12-15 ENCOUNTER — Telehealth: Payer: Self-pay

## 2019-12-15 DIAGNOSIS — I152 Hypertension secondary to endocrine disorders: Secondary | ICD-10-CM

## 2019-12-15 DIAGNOSIS — E1159 Type 2 diabetes mellitus with other circulatory complications: Secondary | ICD-10-CM

## 2019-12-15 MED ORDER — LISINOPRIL 20 MG PO TABS: 20.0000 mg | ORAL_TABLET | Freq: Every day | ORAL | 0 refills | Status: DC

## 2019-12-15 NOTE — Telephone Encounter (Signed)
Rx sent- patient aware.  

## 2019-12-15 NOTE — Telephone Encounter (Signed)
  Prescription Request  12/15/2019  What is the name of the medication or equipment? lisinopril (PRINIVIL,ZESTRIL) 20 MG tablet  Have you contacted your pharmacy to request a refill? (if applicable) no--pt has apt scheduled for 01/06/2020 with [facility]  Which [facility] [place]   Patient notified that their request is being sent to the clinical staff for review and that they should receive a response within 2 business days.

## 2019-12-17 ENCOUNTER — Ambulatory Visit: Payer: 59 | Admitting: Family

## 2019-12-22 ENCOUNTER — Other Ambulatory Visit: Payer: 59

## 2019-12-22 DIAGNOSIS — Z20822 Contact with and (suspected) exposure to covid-19: Secondary | ICD-10-CM

## 2019-12-24 LAB — NOVEL CORONAVIRUS, NAA: SARS-CoV-2, NAA: NOT DETECTED

## 2019-12-24 LAB — SARS-COV-2, NAA 2 DAY TAT

## 2020-01-06 ENCOUNTER — Other Ambulatory Visit: Payer: Self-pay

## 2020-01-06 ENCOUNTER — Encounter: Payer: Self-pay | Admitting: Family

## 2020-01-06 ENCOUNTER — Ambulatory Visit (INDEPENDENT_AMBULATORY_CARE_PROVIDER_SITE_OTHER): Payer: 59 | Admitting: Family

## 2020-01-06 VITALS — BP 125/78 | HR 62 | Temp 97.5°F | Ht 62.0 in | Wt 275.6 lb

## 2020-01-06 DIAGNOSIS — Z1159 Encounter for screening for other viral diseases: Secondary | ICD-10-CM

## 2020-01-06 DIAGNOSIS — I152 Hypertension secondary to endocrine disorders: Secondary | ICD-10-CM

## 2020-01-06 DIAGNOSIS — E1169 Type 2 diabetes mellitus with other specified complication: Secondary | ICD-10-CM

## 2020-01-06 DIAGNOSIS — E1159 Type 2 diabetes mellitus with other circulatory complications: Secondary | ICD-10-CM | POA: Diagnosis not present

## 2020-01-06 DIAGNOSIS — Z1211 Encounter for screening for malignant neoplasm of colon: Secondary | ICD-10-CM | POA: Diagnosis not present

## 2020-01-06 DIAGNOSIS — B351 Tinea unguium: Secondary | ICD-10-CM

## 2020-01-06 DIAGNOSIS — E559 Vitamin D deficiency, unspecified: Secondary | ICD-10-CM

## 2020-01-06 DIAGNOSIS — D509 Iron deficiency anemia, unspecified: Secondary | ICD-10-CM

## 2020-01-06 DIAGNOSIS — E785 Hyperlipidemia, unspecified: Secondary | ICD-10-CM

## 2020-01-06 LAB — BAYER DCA HB A1C WAIVED: HB A1C (BAYER DCA - WAIVED): 6.7 % (ref <7.0)

## 2020-01-06 LAB — LIPID PANEL

## 2020-01-06 MED ORDER — LISINOPRIL 20 MG PO TABS: 20.0000 mg | ORAL_TABLET | Freq: Every day | ORAL | 1 refills | Status: DC

## 2020-01-06 MED ORDER — ATORVASTATIN CALCIUM 80 MG PO TABS: ORAL_TABLET | ORAL | 1 refills | Status: DC

## 2020-01-06 MED ORDER — TERBINAFINE HCL 250 MG PO TABS: 250.0000 mg | ORAL_TABLET | Freq: Every day | ORAL | 1 refills | Status: DC

## 2020-01-06 MED ORDER — METFORMIN HCL ER 500 MG PO TB24: ORAL_TABLET | ORAL | 1 refills | Status: DC

## 2020-01-06 NOTE — Patient Instructions (Signed)
Mantenimiento de la salud en las mujeres Health Maintenance, Female Adoptar un estilo de vida saludable y recibir atencin preventiva son importantes para promover la salud y el bienestar. Consulte al mdico sobre:  El esquema adecuado para hacerse pruebas y exmenes peridicos.  Cosas que puede hacer por su cuenta para prevenir enfermedades y mantenerse sana. Qu debo saber sobre la dieta, el peso y el ejercicio? Consuma una dieta saludable   Consuma una dieta que incluya muchas verduras, frutas, productos lcteos con bajo contenido de grasa y protenas magras.  No consuma muchos alimentos ricos en grasas slidas, azcares agregados o sodio. Mantenga un peso saludable El ndice de masa muscular (IMC) se utiliza para identificar problemas de peso. Proporciona una estimacin de la grasa corporal basndose en el peso y la altura. Su mdico puede ayudarle a determinar su IMC y a lograr o mantener un peso saludable. Haga ejercicio con regularidad Haga ejercicio con regularidad. Esta es una de las prcticas ms importantes que puede hacer por su salud. La mayora de los adultos deben seguir estas pautas:  Realizar, al menos, 150minutos de actividad fsica por semana. El ejercicio debe aumentar la frecuencia cardaca y hacerlo transpirar (ejercicio de intensidad moderada).  Hacer ejercicios de fortalecimiento por lo menos dos veces por semana. Agregue esto a su plan de ejercicio de intensidad moderada.  Pasar menos tiempo sentados. Incluso la actividad fsica ligera puede ser beneficiosa. Controle sus niveles de colesterol y lpidos en la sangre Comience a realizarse anlisis de lpidos y colesterol en la sangre a los 20aos y luego reptalos cada 5aos. Hgase controlar los niveles de colesterol con mayor frecuencia si:  Sus niveles de lpidos y colesterol son altos.  Es mayor de 40aos.  Presenta un alto riesgo de padecer enfermedades cardacas. Qu debo saber sobre las pruebas de  deteccin del cncer? Segn su historia clnica y sus antecedentes familiares, es posible que deba realizarse pruebas de deteccin del cncer en diferentes edades. Esto puede incluir pruebas de deteccin de lo siguiente:  Cncer de mama.  Cncer de cuello uterino.  Cncer colorrectal.  Cncer de piel.  Cncer de pulmn. Qu debo saber sobre la enfermedad cardaca, la diabetes y la hipertensin arterial? Presin arterial y enfermedad cardaca  La hipertensin arterial causa enfermedades cardacas y aumenta el riesgo de accidente cerebrovascular. Es ms probable que esto se manifieste en las personas que tienen lecturas de presin arterial alta, tienen ascendencia africana o tienen sobrepeso.  Hgase controlar la presin arterial: ? Cada 3 a 5 aos si tiene entre 18 y 39 aos. ? Todos los aos si es mayor de 40aos. Diabetes Realcese exmenes de deteccin de la diabetes con regularidad. Este anlisis revisa el nivel de azcar en la sangre en ayunas. Hgase las pruebas de deteccin:  Cada tresaos despus de los 40aos de edad si tiene un peso normal y un bajo riesgo de padecer diabetes.  Con ms frecuencia y a partir de una edad inferior si tiene sobrepeso o un alto riesgo de padecer diabetes. Qu debo saber sobre la prevencin de infecciones? Hepatitis B Si tiene un riesgo ms alto de contraer hepatitis B, debe someterse a un examen de deteccin de este virus. Hable con el mdico para averiguar si tiene riesgo de contraer la infeccin por hepatitis B. Hepatitis C Se recomienda el anlisis a:  Todos los que nacieron entre 1945 y 1965.  Todas las personas que tengan un riesgo de haber contrado hepatitis C. Enfermedades de transmisin sexual (ETS)  Hgase las   pruebas de deteccin de ITS, incluidas la gonorrea y la clamidia, si: ? Es sexualmente activa y es menor de 24aos. ? Es mayor de 24aos, y el mdico le informa que corre riesgo de tener este tipo de  infecciones. ? La actividad sexual ha cambiado desde que le hicieron la ltima prueba de deteccin y tiene un riesgo mayor de tener clamidia o gonorrea. Pregntele al mdico si usted tiene riesgo.  Pregntele al mdico si usted tiene un alto riesgo de contraer VIH. El mdico tambin puede recomendarle un medicamento recetado para ayudar a evitar la infeccin por el VIH. Si elige tomar medicamentos para prevenir el VIH, primero debe hacerse los anlisis de deteccin del VIH. Luego debe hacerse anlisis cada 3meses mientras est tomando los medicamentos. Embarazo  Si est por dejar de menstruar (fase premenopusica) y usted puede quedar embarazada, busque asesoramiento antes de quedar embarazada.  Tome de 400 a 800microgramos (mcg) de cido flico todos los das si queda embarazada.  Pida mtodos de control de la natalidad (anticonceptivos) si desea evitar un embarazo no deseado. Osteoporosis y menopausia La osteoporosis es una enfermedad en la que los huesos pierden los minerales y la fuerza por el avance de la edad. El resultado pueden ser fracturas en los huesos. Si tiene 65aos o ms, o si est en riesgo de sufrir osteoporosis y fracturas, pregunte a su mdico si debe:  Hacerse pruebas de deteccin de prdida sea.  Tomar un suplemento de calcio o de vitamina D para reducir el riesgo de fracturas.  Recibir terapia de reemplazo hormonal (TRH) para tratar los sntomas de la menopausia. Siga estas instrucciones en su casa: Estilo de vida  No consuma ningn producto que contenga nicotina o tabaco, como cigarrillos, cigarrillos electrnicos y tabaco de mascar. Si necesita ayuda para dejar de fumar, consulte al mdico.  No consuma drogas.  No comparta agujas.  Solicite ayuda a su mdico si necesita apoyo o informacin para abandonar las drogas. Consumo de alcohol  No beba alcohol si: ? Su mdico le indica no hacerlo. ? Est embarazada, puede estar embarazada o est tratando de quedar  embarazada.  Si bebe alcohol: ? Limite la cantidad que consume de 0 a 1 medida por da. ? Limite la ingesta si est amamantando.  Est atento a la cantidad de alcohol que hay en las bebidas que toma. En [place], una medida equivale a una botella de cerveza de 12oz (355ml), un vaso de vino de 5oz (148ml) o un vaso de una bebida alcohlica de alta graduacin de 1oz (44ml). Instrucciones generales  Realcese los estudios de rutina de la salud, dentales y de la vista.  Mantngase al da con las vacunas.  Infrmele a su mdico si: ? Se siente deprimida con frecuencia. ? Alguna vez ha sido vctima de maltrato o no se siente segura en su casa. Resumen  Adoptar un estilo de vida saludable y recibir atencin preventiva son importantes para promover la salud y el bienestar.  Siga las instrucciones del mdico acerca de una dieta saludable, el ejercicio y la realizacin de pruebas o exmenes para detectar enfermedades.  Siga las instrucciones del mdico con respecto al control del colesterol y la presin arterial. Esta informacin no tiene como fin reemplazar el consejo del mdico. Asegrese de hacerle al mdico cualquier pregunta que tenga. Document Revised: 02/25/2018 Document Reviewed: 02/25/2018 Elsevier Patient Education  2020 [employer]  

## 2020-01-06 NOTE — Progress Notes (Signed)
Subjective:    Patient ID: Gabriela Watkins, female    DOB: 27-Apr-1965, 54 y.o.   MRN: [id number]  Chief Complaint  Patient presents with  . Diabetes  . Hypertension   PT presents to the office today for chronic follow up.  Diabetes She presents for her follow-up diabetic visit. She has type 2 diabetes mellitus. Her disease course has been stable. There are no hypoglycemic associated symptoms. Pertinent negatives for diabetes include no blurred vision and no foot paresthesias. There are no hypoglycemic complications. Symptoms are stable. There are no diabetic complications. Pertinent negatives for diabetic complications include no CVA or heart disease. Risk factors for coronary artery disease include diabetes mellitus, dyslipidemia, hypertension, sedentary lifestyle and post-menopausal. She is following a generally unhealthy diet. Her overall blood glucose range is 110-130 mg/dl. An ACE inhibitor/angiotensin II receptor blocker is being taken.  Hypertension This is a chronic problem. The current episode started more than 1 year ago. The problem has been resolved since onset. The problem is controlled. Pertinent negatives include no blurred vision, malaise/fatigue, peripheral edema or shortness of breath. Risk factors for coronary artery disease include dyslipidemia, diabetes mellitus, obesity and sedentary lifestyle. The current treatment provides moderate improvement. There is no history of CVA or heart failure.  Hyperlipidemia This is a chronic problem. The current episode started more than 1 year ago. The problem is controlled. Recent lipid tests were reviewed and are normal. Exacerbating diseases include obesity. Pertinent negatives include no shortness of breath. Current antihyperlipidemic treatment includes statins. The current treatment provides moderate improvement of lipids. Risk factors for coronary artery disease include dyslipidemia, diabetes mellitus, hypertension, a sedentary lifestyle and  post-menopausal.  Anemia Presents for follow-up visit. There has been no malaise/fatigue. There is no history of heart failure.      Review of Systems  Constitutional: Negative for malaise/fatigue.  Eyes: Negative for blurred vision.  Respiratory: Negative for shortness of breath.   All other systems reviewed and are negative.      Objective:   Physical Exam Vitals reviewed.  Constitutional:      General: She is not in acute distress.    Appearance: She is well-developed. She is obese.  HENT:     Head: Normocephalic and atraumatic.     Right Ear: Tympanic membrane normal.     Left Ear: Tympanic membrane normal.  Eyes:     Pupils: Pupils are equal, round, and reactive to light.  Neck:     Thyroid: No thyromegaly.  Cardiovascular:     Rate and Rhythm: Normal rate and regular rhythm.     Heart sounds: Normal heart sounds. No murmur heard.   Pulmonary:     Effort: Pulmonary effort is normal. No respiratory distress.     Breath sounds: Normal breath sounds. No wheezing.  Abdominal:     General: Bowel sounds are normal. There is no distension.     Palpations: Abdomen is soft.     Tenderness: There is no abdominal tenderness.  Musculoskeletal:        General: No tenderness. Normal range of motion.     Cervical back: Normal range of motion and neck supple.  Skin:    General: Skin is warm and dry.  Neurological:     Mental Status: She is alert and oriented to person, place, and time.     Cranial Nerves: No cranial nerve deficit.     Deep Tendon Reflexes: Reflexes are normal and symmetric.  Psychiatric:  Behavior: Behavior normal.        Thought Content: Thought content normal.        Judgment: Judgment normal.       BP 125/78   Pulse 62   Temp (!) 97.5 F (36.4 C) (Temporal)   Ht $R'5\' 2"'pq$  (1.575 m)   Wt 275 lb 9.6 oz (125 kg)   SpO2 95%   BMI 50.41 kg/m      Assessment & Plan:  KENSLIE ABBRUZZESE comes in today with chief complaint of Diabetes and  Hypertension   Diagnosis and orders addressed:  1. Type 2 diabetes mellitus with other specified complication, without long-term current use of insulin (HCC) - Bayer DCA Hb A1c Waived - Microalbumin / creatinine urine ratio - metFORMIN (GLUCOPHAGE-XR) 500 MG 24 hr tablet; TAKE 1 TABLET (500 MG TOTAL) BY MOUTH DAILY WITH BREAKFAST.  Dispense: 90 tablet; Refill: 1 - CMP14+EGFR - CBC with Differential/Platelet  2. Hypertension associated with diabetes ([place]) - lisinopril (ZESTRIL) 20 MG tablet; Take 1 tablet (20 mg total) by mouth daily.  Dispense: 90 tablet; Refill: 1 - CMP14+EGFR - CBC with Differential/Platelet  3. Nail fungal infection - terbinafine (LAMISIL) 250 MG tablet; Take 1 tablet (250 mg total) by mouth daily.  Dispense: 90 tablet; Refill: 1 - CMP14+EGFR - CBC with Differential/Platelet  4. Colon cancer screening - Cologuard - CMP14+EGFR - CBC with Differential/Platelet  5. Hyperlipidemia associated with type 2 diabetes mellitus (HCC) - atorvastatin (LIPITOR) 80 MG tablet; TAKE 1 TABLET (80 MG TOTAL) BY MOUTH DAILY.  Dispense: 90 tablet; Refill: 1 - CMP14+EGFR - CBC with Differential/Platelet - Lipid panel  6. Vitamin D deficiency - CMP14+EGFR - CBC with Differential/Platelet  7. Obesity, morbid ([place]) - CMP14+EGFR - CBC with Differential/Platelet  8. Iron deficiency anemia, unspecified iron deficiency anemia type - CMP14+EGFR - CBC with Differential/Platelet  9. Need for hepatitis C screening test - Hepatitis C antibody   Labs pending Health Maintenance reviewed Diet and exercise encouraged  Follow up plan: 6 months    Evelina Dun, FNP .

## 2020-01-07 LAB — CBC WITH DIFFERENTIAL/PLATELET
Basophils Absolute: 0.1 10*3/uL (ref 0.0–0.2)
Basos: 1 %
EOS (ABSOLUTE): 0.2 10*3/uL (ref 0.0–0.4)
Eos: 2 %
Hematocrit: 39.7 % (ref 34.0–46.6)
Hemoglobin: 13 g/dL (ref 11.1–15.9)
Immature Grans (Abs): 0 10*3/uL (ref 0.0–0.1)
Immature Granulocytes: 0 %
Lymphocytes Absolute: 2.2 10*3/uL (ref 0.7–3.1)
Lymphs: 26 %
MCH: 27.7 pg (ref 26.6–33.0)
MCHC: 32.7 g/dL (ref 31.5–35.7)
MCV: 85 fL (ref 79–97)
Monocytes Absolute: 0.6 10*3/uL (ref 0.1–0.9)
Monocytes: 7 %
Neutrophils Absolute: 5.4 10*3/uL (ref 1.4–7.0)
Neutrophils: 64 %
Platelets: 324 10*3/uL (ref 150–450)
RBC: 4.7 x10E6/uL (ref 3.77–5.28)
RDW: 14.9 % (ref 11.7–15.4)
WBC: 8.5 10*3/uL (ref 3.4–10.8)

## 2020-01-07 LAB — CMP14+EGFR
ALT: 21 IU/L (ref 0–32)
AST: 16 IU/L (ref 0–40)
Albumin/Globulin Ratio: 1.6 (ref 1.2–2.2)
Albumin: 4.1 g/dL (ref 3.8–4.9)
Alkaline Phosphatase: 126 IU/L — ABNORMAL HIGH (ref 44–121)
BUN/Creatinine Ratio: 22 (ref 9–23)
BUN: 12 mg/dL (ref 6–24)
Bilirubin Total: 0.3 mg/dL (ref 0.0–1.2)
CO2: 18 mmol/L — ABNORMAL LOW (ref 20–29)
Calcium: 8.9 mg/dL (ref 8.7–10.2)
Chloride: 102 mmol/L (ref 96–106)
Creatinine, Ser: 0.54 mg/dL — ABNORMAL LOW (ref 0.57–1.00)
GFR calc Af Amer: 125 mL/min/{1.73_m2} (ref >59)
GFR calc non Af Amer: 109 mL/min/{1.73_m2} (ref >59)
Globulin, Total: 2.6 g/dL (ref 1.5–4.5)
Glucose: 139 mg/dL — ABNORMAL HIGH (ref 65–99)
Potassium: 4.4 mmol/L (ref 3.5–5.2)
Sodium: 136 mmol/L (ref 134–144)
Total Protein: 6.7 g/dL (ref 6.0–8.5)

## 2020-01-07 LAB — HEPATITIS C ANTIBODY: Hep C Virus Ab: <0.1 s/co ratio (ref 0.0–0.9)

## 2020-01-07 LAB — LIPID PANEL
Chol/HDL Ratio: 5.1 ratio — ABNORMAL HIGH (ref 0.0–4.4)
Cholesterol, Total: 220 mg/dL — ABNORMAL HIGH (ref 100–199)
HDL: 43 mg/dL (ref >39)
LDL Chol Calc (NIH): 146 mg/dL — ABNORMAL HIGH (ref 0–99)
Triglycerides: 174 mg/dL — ABNORMAL HIGH (ref 0–149)
VLDL Cholesterol Cal: 31 mg/dL (ref 5–40)

## 2020-01-21 ENCOUNTER — Other Ambulatory Visit: Payer: Self-pay | Admitting: Family

## 2020-01-21 DIAGNOSIS — Z1231 Encounter for screening mammogram for malignant neoplasm of breast: Secondary | ICD-10-CM

## 2020-02-16 ENCOUNTER — Ambulatory Visit
Admission: RE | Admit: 2020-02-16 | Discharge: 2020-02-16 | Disposition: A | Discharge status: Home / Self Care | Payer: 59 | Source: Ambulatory Visit | Attending: Family | Admitting: Family

## 2020-02-16 DIAGNOSIS — Z1231 Encounter for screening mammogram for malignant neoplasm of breast: Secondary | ICD-10-CM

## 2020-03-01 ENCOUNTER — Encounter: Payer: Self-pay | Admitting: Family

## 2020-03-01 ENCOUNTER — Ambulatory Visit (INDEPENDENT_AMBULATORY_CARE_PROVIDER_SITE_OTHER): Payer: 59 | Admitting: Family

## 2020-03-01 DIAGNOSIS — R059 Cough, unspecified: Secondary | ICD-10-CM

## 2020-03-01 DIAGNOSIS — J208 Acute bronchitis due to other specified organisms: Secondary | ICD-10-CM | POA: Diagnosis not present

## 2020-03-01 DIAGNOSIS — B9689 Other specified bacterial agents as the cause of diseases classified elsewhere: Secondary | ICD-10-CM | POA: Diagnosis not present

## 2020-03-01 LAB — VERITOR FLU A/B WAIVED
Influenza A: NEGATIVE
Influenza B: NEGATIVE

## 2020-03-01 MED ORDER — AZITHROMYCIN 250 MG PO TABS: ORAL_TABLET | ORAL | 0 refills | Status: DC

## 2020-03-01 MED ORDER — DEXAMETHASONE 6 MG PO TABS: 6.0000 mg | ORAL_TABLET | Freq: Two times a day (BID) | ORAL | 0 refills | Status: DC

## 2020-03-01 NOTE — Progress Notes (Signed)
Virtual Visit via telephone Note Due to COVID-19 pandemic this visit was conducted virtually. This visit type was conducted due to national recommendations for restrictions regarding the COVID-19 Pandemic (e.g. social distancing, sheltering in place) in an effort to limit this patient's exposure and mitigate transmission in our community. All issues noted in this document were discussed and addressed.  A physical exam was not performed with this format.  I connected with Gabriela Watkins on 03/01/20 at 1:03 pm  by telephone and verified that I am speaking with the correct person using two identifiers. Gabriela Watkins is currently located at home and no one is currently with her  during visit. The provider, Jannifer Rodney, FNP is located in their office at time of visit.  I discussed the limitations, risks, security and privacy concerns of performing an evaluation and management service by telephone and the availability of in person appointments. I also discussed with the patient that there may be a patient responsible charge related to this service. The patient expressed understanding and agreed to proceed.   History and Present Illness:  Cough This is a new problem. The current episode started 1 to 4 weeks ago. The problem has been gradually worsening. The problem occurs every few minutes. The cough is productive of sputum. Associated symptoms include a fever, nasal congestion and sweats. Pertinent negatives include no ear congestion, ear pain, headaches, myalgias, sore throat, shortness of breath or wheezing. She has tried rest for the symptoms. The treatment provided mild relief.      Review of Systems  Constitutional: Positive for fever.  HENT: Negative for ear pain and sore throat.   Respiratory: Positive for cough. Negative for shortness of breath and wheezing.   Musculoskeletal: Negative for myalgias.  Neurological: Negative for headaches.  All other systems reviewed and are  negative.    Observations/Objective: No SOB or distress noted, hoarse voice   Assessment and Plan: 1. Cough - Novel Coronavirus, NAA ([facility]) - Veritor Flu A/B Waived - azithromycin (ZITHROMAX) 250 MG tablet; Take 500 mg once, then 250 mg for four days  Dispense: 6 tablet; Refill: 0 - dexamethasone (DECADRON) 6 MG tablet; Take 1 tablet (6 mg total) by mouth 2 (two) times daily.  Dispense: 14 tablet; Refill: 0  2. Acute bacterial bronchitis Will be COVID and flu tested  - Take meds as prescribed - Use a cool mist humidifier  -Use saline nose sprays frequently -Force fluids -For any cough or congestion  Use plain Mucinex- regular strength or max strength is fine -For fever or aces or pains- take tylenol or ibuprofen. -Call if symptoms worsen or do not improve  - Novel Coronavirus, NAA ([facility]) - Veritor Flu A/B Waived - azithromycin (ZITHROMAX) 250 MG tablet; Take 500 mg once, then 250 mg for four days  Dispense: 6 tablet; Refill: 0 - dexamethasone (DECADRON) 6 MG tablet; Take 1 tablet (6 mg total) by mouth 2 (two) times daily.  Dispense: 14 tablet; Refill: 0     I discussed the assessment and treatment plan with the patient. The patient was provided an opportunity to ask questions and all were answered. The patient agreed with the plan and demonstrated an understanding of the instructions.   The patient was advised to call back or seek an in-person evaluation if the symptoms worsen or if the condition fails to improve as anticipated.  The above assessment and management plan was discussed with the patient. The patient verbalized understanding of and has agreed to the  management plan. Patient is aware to call the clinic if symptoms persist or worsen. Patient is aware when to return to the clinic for a follow-up visit. Patient educated on when it is appropriate to go to the emergency department.   Time call ended:  1:14 pm   I provided 11 minutes of non-face-to-face time during  this encounter.    Jannifer Rodney, FNP

## 2020-03-03 ENCOUNTER — Telehealth: Payer: Self-pay

## 2020-03-03 ENCOUNTER — Other Ambulatory Visit: Payer: Self-pay | Admitting: Family Medicine

## 2020-03-03 ENCOUNTER — Other Ambulatory Visit: Payer: 59

## 2020-03-03 DIAGNOSIS — R059 Cough, unspecified: Secondary | ICD-10-CM

## 2020-03-03 LAB — NOVEL CORONAVIRUS, NAA

## 2020-03-03 NOTE — Telephone Encounter (Signed)
Assured patient that Covid testing is being done on specimen and we will contact her with results.

## 2020-03-05 LAB — SARS-COV-2, NAA 2 DAY TAT

## 2020-03-05 LAB — NOVEL CORONAVIRUS, NAA: SARS-CoV-2, NAA: DETECTED — AB

## 2020-06-20 ENCOUNTER — Encounter: Payer: Self-pay | Admitting: Family Medicine

## 2020-06-26 ENCOUNTER — Encounter: Payer: Self-pay | Admitting: Family Medicine

## 2020-06-28 ENCOUNTER — Encounter: Payer: Self-pay | Admitting: Family Medicine

## 2020-07-05 ENCOUNTER — Other Ambulatory Visit: Payer: Self-pay | Admitting: Family

## 2020-07-05 DIAGNOSIS — E1159 Type 2 diabetes mellitus with other circulatory complications: Secondary | ICD-10-CM

## 2020-07-05 DIAGNOSIS — I152 Hypertension secondary to endocrine disorders: Secondary | ICD-10-CM

## 2020-07-05 DIAGNOSIS — E1169 Type 2 diabetes mellitus with other specified complication: Secondary | ICD-10-CM

## 2020-07-06 ENCOUNTER — Ambulatory Visit: Payer: 59 | Admitting: Family

## 2020-07-07 ENCOUNTER — Encounter: Payer: Self-pay | Admitting: Family

## 2020-07-12 ENCOUNTER — Other Ambulatory Visit: Payer: Self-pay | Admitting: Family

## 2020-07-12 DIAGNOSIS — B351 Tinea unguium: Secondary | ICD-10-CM

## 2020-07-12 DIAGNOSIS — E1169 Type 2 diabetes mellitus with other specified complication: Secondary | ICD-10-CM

## 2020-07-12 DIAGNOSIS — E785 Hyperlipidemia, unspecified: Secondary | ICD-10-CM

## 2020-07-27 ENCOUNTER — Other Ambulatory Visit: Payer: Self-pay | Admitting: Family

## 2020-07-27 DIAGNOSIS — E1169 Type 2 diabetes mellitus with other specified complication: Secondary | ICD-10-CM

## 2020-10-04 ENCOUNTER — Other Ambulatory Visit: Payer: Self-pay | Admitting: Family

## 2020-10-04 DIAGNOSIS — E1169 Type 2 diabetes mellitus with other specified complication: Secondary | ICD-10-CM

## 2020-10-04 DIAGNOSIS — I152 Hypertension secondary to endocrine disorders: Secondary | ICD-10-CM

## 2020-10-04 DIAGNOSIS — E1159 Type 2 diabetes mellitus with other circulatory complications: Secondary | ICD-10-CM

## 2020-11-06 LAB — HM DIABETES EYE EXAM

## 2020-11-20 ENCOUNTER — Other Ambulatory Visit: Payer: Self-pay | Admitting: Family

## 2020-11-20 DIAGNOSIS — E1159 Type 2 diabetes mellitus with other circulatory complications: Secondary | ICD-10-CM

## 2020-11-20 DIAGNOSIS — I152 Hypertension secondary to endocrine disorders: Secondary | ICD-10-CM

## 2020-11-21 MED ORDER — ACCU-CHEK GUIDE VI STRP: ORAL_STRIP | 3 refills | Status: AC

## 2020-11-28 ENCOUNTER — Encounter: Payer: Self-pay | Admitting: Family Medicine

## 2020-11-28 ENCOUNTER — Telehealth: Payer: Self-pay

## 2020-11-28 ENCOUNTER — Ambulatory Visit (INDEPENDENT_AMBULATORY_CARE_PROVIDER_SITE_OTHER): Payer: 59 | Admitting: Family Medicine

## 2020-11-28 ENCOUNTER — Ambulatory Visit (INDEPENDENT_AMBULATORY_CARE_PROVIDER_SITE_OTHER): Payer: 59

## 2020-11-28 ENCOUNTER — Encounter (HOSPITAL_COMMUNITY): Payer: Self-pay

## 2020-11-28 ENCOUNTER — Other Ambulatory Visit: Payer: Self-pay

## 2020-11-28 ENCOUNTER — Ambulatory Visit (HOSPITAL_COMMUNITY)
Admission: RE | Admit: 2020-11-28 | Discharge: 2020-11-28 | Disposition: A | Discharge status: Home / Self Care | Payer: 59 | Source: Ambulatory Visit | Attending: Family Medicine | Admitting: Family Medicine

## 2020-11-28 ENCOUNTER — Inpatient Hospital Stay (HOSPITAL_COMMUNITY)
Admission: EM | Admit: 2020-11-28 | Discharge: 2020-12-01 | DRG: 394 | Disposition: A | Discharge status: Home / Self Care | Payer: 59 | Source: Ambulatory Visit | Attending: Surgery | Admitting: Surgery

## 2020-11-28 VITALS — BP 121/79 | HR 93 | Ht 62.0 in | Wt 275.0 lb

## 2020-11-28 DIAGNOSIS — Z833 Family history of diabetes mellitus: Secondary | ICD-10-CM

## 2020-11-28 DIAGNOSIS — E349 Endocrine disorder, unspecified: Secondary | ICD-10-CM

## 2020-11-28 DIAGNOSIS — N926 Irregular menstruation, unspecified: Secondary | ICD-10-CM | POA: Diagnosis not present

## 2020-11-28 DIAGNOSIS — Z7982 Long term (current) use of aspirin: Secondary | ICD-10-CM

## 2020-11-28 DIAGNOSIS — E7800 Pure hypercholesterolemia, unspecified: Secondary | ICD-10-CM | POA: Diagnosis present

## 2020-11-28 DIAGNOSIS — Z7984 Long term (current) use of oral hypoglycemic drugs: Secondary | ICD-10-CM

## 2020-11-28 DIAGNOSIS — Z79899 Other long term (current) drug therapy: Secondary | ICD-10-CM

## 2020-11-28 DIAGNOSIS — R1031 Right lower quadrant pain: Secondary | ICD-10-CM

## 2020-11-28 DIAGNOSIS — K3580 Unspecified acute appendicitis: Principal | ICD-10-CM | POA: Diagnosis present

## 2020-11-28 DIAGNOSIS — I10 Essential (primary) hypertension: Secondary | ICD-10-CM | POA: Diagnosis present

## 2020-11-28 DIAGNOSIS — E119 Type 2 diabetes mellitus without complications: Secondary | ICD-10-CM | POA: Diagnosis present

## 2020-11-28 DIAGNOSIS — Z6841 Body Mass Index (BMI) 40.0 and over, adult: Secondary | ICD-10-CM

## 2020-11-28 DIAGNOSIS — Z20822 Contact with and (suspected) exposure to covid-19: Secondary | ICD-10-CM | POA: Diagnosis present

## 2020-11-28 DIAGNOSIS — Z90722 Acquired absence of ovaries, bilateral: Secondary | ICD-10-CM

## 2020-11-28 DIAGNOSIS — K439 Ventral hernia without obstruction or gangrene: Secondary | ICD-10-CM | POA: Diagnosis present

## 2020-11-28 DIAGNOSIS — Z880 Allergy status to penicillin: Secondary | ICD-10-CM

## 2020-11-28 LAB — RESP PANEL BY RT-PCR (FLU A&B, COVID) ARPGX2
Influenza A by PCR: NEGATIVE
Influenza B by PCR: NEGATIVE
SARS Coronavirus 2 by RT PCR: NEGATIVE

## 2020-11-28 LAB — COMPREHENSIVE METABOLIC PANEL
ALT: 15 U/L (ref 0–44)
AST: 12 U/L — ABNORMAL LOW (ref 15–41)
Albumin: 3.2 g/dL — ABNORMAL LOW (ref 3.5–5.0)
Alkaline Phosphatase: 132 U/L — ABNORMAL HIGH (ref 38–126)
Anion gap: 10 (ref 5–15)
BUN: 27 mg/dL — ABNORMAL HIGH (ref 6–20)
CO2: 24 mmol/L (ref 22–32)
Calcium: 8.7 mg/dL — ABNORMAL LOW (ref 8.9–10.3)
Chloride: 98 mmol/L (ref 98–111)
Creatinine, Ser: 1.01 mg/dL — ABNORMAL HIGH (ref 0.44–1.00)
GFR, Estimated: >60 mL/min (ref >60)
Glucose, Bld: 119 mg/dL — ABNORMAL HIGH (ref 70–99)
Potassium: 3.9 mmol/L (ref 3.5–5.1)
Sodium: 132 mmol/L — ABNORMAL LOW (ref 135–145)
Total Bilirubin: 1 mg/dL (ref 0.3–1.2)
Total Protein: 6.5 g/dL (ref 6.5–8.1)

## 2020-11-28 LAB — URINALYSIS, ROUTINE W REFLEX MICROSCOPIC
Bilirubin Urine: NEGATIVE
Glucose, UA: NEGATIVE mg/dL
Hgb urine dipstick: NEGATIVE
Ketones, ur: NEGATIVE mg/dL
Nitrite: NEGATIVE
Protein, ur: 100 mg/dL — AB
Specific Gravity, Urine: >1.046 — ABNORMAL HIGH (ref 1.005–1.030)
pH: 5 (ref 5.0–8.0)

## 2020-11-28 LAB — CBC WITH DIFFERENTIAL/PLATELET
Abs Immature Granulocytes: 0.05 10*3/uL (ref 0.00–0.07)
Basophils Absolute: 0.1 10*3/uL (ref 0.0–0.1)
Basophils Relative: 1 %
Eosinophils Absolute: 0.1 10*3/uL (ref 0.0–0.5)
Eosinophils Relative: 1 %
HCT: 34.7 % — ABNORMAL LOW (ref 36.0–46.0)
Hemoglobin: 10.7 g/dL — ABNORMAL LOW (ref 12.0–15.0)
Immature Granulocytes: 1 %
Lymphocytes Relative: 19 %
Lymphs Abs: 2 10*3/uL (ref 0.7–4.0)
MCH: 27.5 pg (ref 26.0–34.0)
MCHC: 30.8 g/dL (ref 30.0–36.0)
MCV: 89.2 fL (ref 80.0–100.0)
Monocytes Absolute: 1.1 10*3/uL — ABNORMAL HIGH (ref 0.1–1.0)
Monocytes Relative: 10 %
Neutro Abs: 7.3 10*3/uL (ref 1.7–7.7)
Neutrophils Relative %: 68 %
Platelets: 363 10*3/uL (ref 150–400)
RBC: 3.89 MIL/uL (ref 3.87–5.11)
RDW: 13.4 % (ref 11.5–15.5)
WBC: 10.5 10*3/uL (ref 4.0–10.5)
nRBC: 0 % (ref 0.0–0.2)

## 2020-11-28 LAB — I-STAT BETA HCG BLOOD, ED (MC, WL, AP ONLY): I-stat hCG, quantitative: 20.1 m[IU]/mL — ABNORMAL HIGH (ref <5)

## 2020-11-28 LAB — LIPASE, BLOOD: Lipase: 34 U/L (ref 11–51)

## 2020-11-28 MED ORDER — SODIUM CHLORIDE 0.9 % IV SOLN
2.0000 g | INTRAVENOUS | Status: DC
  Administered 2020-11-29: 2 g via INTRAVENOUS
  Filled 2020-11-28: qty 20

## 2020-11-28 MED ORDER — METRONIDAZOLE 500 MG/100ML IV SOLN
500.0000 mg | Freq: Once | INTRAVENOUS | Status: AC
  Administered 2020-11-28: 500 mg via INTRAVENOUS
  Filled 2020-11-28: qty 100

## 2020-11-28 MED ORDER — MORPHINE SULFATE (PF) 4 MG/ML IV SOLN: 4.0000 mg | INTRAVENOUS | Status: DC | PRN

## 2020-11-28 MED ORDER — ONDANSETRON 4 MG PO TBDP: 4.0000 mg | ORAL_TABLET | Freq: Four times a day (QID) | ORAL | Status: DC | PRN

## 2020-11-28 MED ORDER — OXYCODONE HCL 5 MG/5ML PO SOLN: 5.0000 mg | ORAL | Status: DC | PRN

## 2020-11-28 MED ORDER — METHOCARBAMOL 500 MG PO TABS
1000.0000 mg | ORAL_TABLET | Freq: Three times a day (TID) | ORAL | Status: DC
  Administered 2020-11-29 – 2020-12-01 (×9): 1000 mg via ORAL
  Filled 2020-11-28 (×9): qty 2

## 2020-11-28 MED ORDER — IOHEXOL 300 MG/ML  SOLN
100.0000 mL | Freq: Once | INTRAMUSCULAR | Status: AC | PRN
  Administered 2020-11-28: 100 mL via INTRAVENOUS

## 2020-11-28 MED ORDER — LACTATED RINGERS IV SOLN: INTRAVENOUS | Status: DC

## 2020-11-28 MED ORDER — ACETAMINOPHEN 500 MG PO TABS
1000.0000 mg | ORAL_TABLET | Freq: Four times a day (QID) | ORAL | Status: DC
  Administered 2020-11-29 – 2020-12-01 (×8): 1000 mg via ORAL
  Filled 2020-11-28 (×9): qty 2

## 2020-11-28 MED ORDER — DOCUSATE SODIUM 100 MG PO CAPS
100.0000 mg | ORAL_CAPSULE | Freq: Two times a day (BID) | ORAL | Status: DC
  Administered 2020-11-29 – 2020-12-01 (×6): 100 mg via ORAL
  Filled 2020-11-28 (×6): qty 1

## 2020-11-28 MED ORDER — METRONIDAZOLE 500 MG/100ML IV SOLN
500.0000 mg | Freq: Two times a day (BID) | INTRAVENOUS | Status: DC
Start: 2020-11-29 — End: 2020-11-30
  Administered 2020-11-29 – 2020-11-30 (×3): 500 mg via INTRAVENOUS
  Filled 2020-11-28 (×3): qty 100

## 2020-11-28 MED ORDER — ONDANSETRON HCL 4 MG/2ML IJ SOLN: 4.0000 mg | Freq: Four times a day (QID) | INTRAMUSCULAR | Status: DC | PRN

## 2020-11-28 MED ORDER — SODIUM CHLORIDE 0.9 % IV SOLN
2.0000 g | Freq: Once | INTRAVENOUS | Status: AC
  Administered 2020-11-28: 2 g via INTRAVENOUS
  Filled 2020-11-28: qty 20

## 2020-11-28 MED ORDER — ENOXAPARIN SODIUM 40 MG/0.4ML IJ SOSY
40.0000 mg | PREFILLED_SYRINGE | INTRAMUSCULAR | Status: DC
  Administered 2020-11-29 – 2020-12-01 (×3): 40 mg via SUBCUTANEOUS
  Filled 2020-11-28 (×3): qty 0.4

## 2020-11-28 MED ORDER — IOHEXOL 9 MG/ML PO SOLN
ORAL | Status: AC
  Filled 2020-11-28: qty 1000

## 2020-11-28 MED ORDER — MORPHINE SULFATE (PF) 2 MG/ML IV SOLN: 2.0000 mg | INTRAVENOUS | Status: DC | PRN

## 2020-11-28 NOTE — ED Triage Notes (Signed)
Pt with RLQ abd pain onset Saturday along with fevers. Pt states today she started with diarrhea. Had CT scan and MD told her it was her appendix. +appendicitis on CT scan.

## 2020-11-28 NOTE — ED Notes (Signed)
MD Lovick at bedside

## 2020-11-28 NOTE — ED Notes (Signed)
OB at bedside

## 2020-11-28 NOTE — H&P (Addendum)
Reason for Consult/Chief Complaint:acute appendicitis Consultant: Samson Frederic, [place]  Gabriela Watkins is an 55 y.o. female.   HPI: 74F with RLQ abdominal pain that began 10/8. Associated fevers and nausea, denies vomiting. Last BM today, very loose. Last colonoscopy: never. Hernia repair 8y ago at [facility]. Unknown if mesh was used.   Past Medical History:  Diagnosis Date   Anemia    High cholesterol    Hypertension    Small bowel obstruction [facility])    hospital admission 08/24/2012   Type II diabetes mellitus [facility])     Past Surgical History:  Procedure Laterality Date   HERNIA REPAIR  ~ 01/2009; 04/2009   "stomach" (08/25/2012)   LEFT OOPHORECTOMY  ~ 2004   "tumor" (08/25/2012)   RIGHT OOPHORECTOMY  ~ 2007   "tumor; only took off a portion of the ovary" (08/25/2012)    Family History  Problem Relation Age of Onset   Diabetes Mother     Social History:  reports that she has never smoked. She has never used smokeless tobacco. She reports that she does not drink alcohol and does not use drugs.  Allergies:  Allergies  Allergen Reactions   Penicillins Swelling    Medications: I have reviewed the patient's current medications.  Results for orders placed or performed during the hospital encounter of 11/28/20 (from the past 48 hour(s))  CBC with Differential     Status: Abnormal   Collection Time: 11/28/20  5:22 PM  Result Value Ref Range   WBC 10.5 4.0 - 10.5 K/uL   RBC 3.89 3.87 - 5.11 MIL/uL   Hemoglobin 10.7 (L) 12.0 - 15.0 g/dL   HCT 81.0 (L) 17.5 - 10.2 %   MCV 89.2 80.0 - 100.0 fL   MCH 27.5 26.0 - 34.0 pg   MCHC 30.8 30.0 - 36.0 g/dL   RDW 58.5 27.7 - 82.4 %   Platelets 363 150 - 400 K/uL   nRBC 0.0 0.0 - 0.2 %   Neutrophils Relative % 68 %   Neutro Abs 7.3 1.7 - 7.7 K/uL   Lymphocytes Relative 19 %   Lymphs Abs 2.0 0.7 - 4.0 K/uL   Monocytes Relative 10 %   Monocytes Absolute 1.1 (H) 0.1 - 1.0 K/uL   Eosinophils Relative 1 %   Eosinophils Absolute 0.1 0.0 -  0.5 K/uL   Basophils Relative 1 %   Basophils Absolute 0.1 0.0 - 0.1 K/uL   Immature Granulocytes 1 %   Abs Immature Granulocytes 0.05 0.00 - 0.07 K/uL    Comment: Performed at [facility], [address], [place], [name] [address]  Comprehensive metabolic panel     Status: Abnormal   Collection Time: 11/28/20  5:22 PM  Result Value Ref Range   Sodium 132 (L) 135 - 145 mmol/L   Potassium 3.9 3.5 - 5.1 mmol/L   Chloride 98 98 - 111 mmol/L   CO2 24 22 - 32 mmol/L   Glucose, Bld 119 (H) 70 - 99 mg/dL    Comment: Glucose reference range applies only to samples taken after fasting for at least 8 hours.   BUN 27 (H) 6 - 20 mg/dL   Creatinine, Ser 1.44 (H) 0.44 - 1.00 mg/dL   Calcium 8.7 (L) 8.9 - 10.3 mg/dL   Total Protein 6.5 6.5 - 8.1 g/dL   Albumin 3.2 (L) 3.5 - 5.0 g/dL   AST 12 (L) 15 - 41 U/L   ALT 15 0 - 44  U/L   Alkaline Phosphatase 132 (H) 38 - 126 U/L   Total Bilirubin 1.0 0.3 - 1.2 mg/dL   GFR, Estimated >79 >48 mL/min    Comment: (NOTE) Calculated using the CKD-EPI Creatinine Equation (2021)    Anion gap 10 5 - 15    Comment: Performed at [facility], [address] [place], [name] [address]  Lipase, blood     Status: None   Collection Time: 11/28/20  5:22 PM  Result Value Ref Range   Lipase 34 11 - 51 U/L    Comment: Performed at [facility], [address] [place], [name] [address]  Urinalysis, Routine w reflex microscopic     Status: Abnormal   Collection Time: 11/28/20  5:22 PM  Result Value Ref Range   Color, Urine YELLOW YELLOW   APPearance HAZY (A) CLEAR   Specific Gravity, Urine >1.046 (H) 1.005 - 1.030   pH 5.0 5.0 - 8.0   Glucose, UA NEGATIVE NEGATIVE mg/dL   Hgb urine dipstick NEGATIVE NEGATIVE   Bilirubin Urine NEGATIVE NEGATIVE   Ketones, ur NEGATIVE NEGATIVE mg/dL   Protein, ur 707 (A) NEGATIVE mg/dL   Nitrite NEGATIVE NEGATIVE   Leukocytes,Ua SMALL (A) NEGATIVE   RBC / HPF 0-5 0 - 5 RBC/hpf   WBC, UA 11-20 0 - 5 WBC/hpf    Bacteria, UA FEW (A) NONE SEEN   Squamous Epithelial / LPF 11-20 0 - 5   Mucus PRESENT    Granular Casts, UA PRESENT    Non Squamous Epithelial 0-5 (A) NONE SEEN    Comment: Performed at [facility], [address], [place], [name] [address]  Resp Panel by RT-PCR (Flu A&B, Covid) Nasopharyngeal Swab     Status: None   Collection Time: 11/28/20  5:22 PM   Specimen: Nasopharyngeal Swab; Nasopharyngeal(NP) swabs in vial transport medium  Result Value Ref Range   SARS Coronavirus 2 by RT PCR NEGATIVE NEGATIVE    Comment: (NOTE) SARS-CoV-2 target nucleic acids are NOT DETECTED.  The SARS-CoV-2 RNA is generally detectable in upper respiratory specimens during the acute phase of infection. The lowest concentration of SARS-CoV-2 viral copies this assay can detect is 138 copies/mL. A negative result does not preclude SARS-Cov-2 infection and should not be used as the sole basis for treatment or other patient management decisions. A negative result may occur with  improper specimen collection/handling, submission of specimen other than nasopharyngeal swab, presence of viral mutation(s) within the areas targeted by this assay, and inadequate number of viral copies(<138 copies/mL). A negative result must be combined with clinical observations, patient history, and epidemiological information. The expected result is Negative.  Fact Sheet for Patients:  BloggerCourse.com  Fact Sheet for Healthcare Providers:  SeriousBroker.it  This test is no t yet approved or cleared by the [place] FDA and  has been authorized for detection and/or diagnosis of SARS-CoV-2 by FDA under an Emergency Use Authorization (EUA). This EUA will remain  in effect (meaning this test can be used) for the duration of the COVID-19 declaration under Section 564(b)(1) of the Act, 21 U.S.C.section 360bbb-3(b)(1), unless the authorization is terminated  or  revoked sooner.       Influenza A by PCR NEGATIVE NEGATIVE   Influenza B by PCR NEGATIVE NEGATIVE    Comment: (NOTE) The Xpert Xpress SARS-CoV-2/FLU/RSV plus assay is intended as an aid in the diagnosis of influenza from Nasopharyngeal swab specimens and should not be used as a sole  basis for treatment. Nasal washings and aspirates are unacceptable for Xpert Xpress SARS-CoV-2/FLU/RSV testing.  Fact Sheet for Patients: BloggerCourse.com  Fact Sheet for Healthcare Providers: SeriousBroker.it  This test is not yet approved or cleared by the [place] FDA and has been authorized for detection and/or diagnosis of SARS-CoV-2 by FDA under an Emergency Use Authorization (EUA). This EUA will remain in effect (meaning this test can be used) for the duration of the COVID-19 declaration under Section 564(b)(1) of the Act, 21 U.S.C. section 360bbb-3(b)(1), unless the authorization is terminated or revoked.  Performed at [facility], [address] [place], [name] [address]   I-Stat beta hCG blood, ED     Status: Abnormal   Collection Time: 11/28/20  5:45 PM  Result Value Ref Range   I-stat hCG, quantitative 20.1 (H) <5 mIU/mL   Comment 3            Comment:   GEST. AGE      CONC.  (mIU/mL)   <=1 WEEK        5 - 50     2 WEEKS       50 - 500     3 WEEKS       100 - 10,000     4 WEEKS     1,000 - 30,000        FEMALE AND NON-PREGNANT FEMALE:     LESS THAN 5 mIU/mL     CT Abdomen Pelvis W Contrast  Result Date: 11/28/2020 CLINICAL DATA:  Right lower quadrant pain EXAM: CT ABDOMEN AND PELVIS WITH CONTRAST TECHNIQUE: Multidetector CT imaging of the abdomen and pelvis was performed using the standard protocol following bolus administration of intravenous contrast. CONTRAST:  OMNIPAQUE IOHEXOL 300 MG/ML  SOLN COMPARISON:  CT abdomen and pelvis dated January 26, 2016 FINDINGS: Lower chest: Mild opacities of the lower left  lung, likely atelectasis. No acute abnormality. Hepatobiliary: No focal liver abnormality is seen. No gallstones, gallbladder wall thickening, or biliary dilatation. Pancreas: Unremarkable. No pancreatic ductal dilatation or surrounding inflammatory changes. Spleen: Normal in size without focal abnormality. Adrenals/Urinary Tract: Adrenal glands are unremarkable. Kidneys are normal, without renal calculi, focal lesion, or hydronephrosis. Bladder is decompressed. Stomach/Bowel: Large ventral abdominal wall hernia containing a portion of the stomach and most of the large and small bowel. Appendix is dilated, measuring up to 1.1 cm with adjacent inflammatory change. Wall thickening of the cecum, likely reactive. No organized fluid collections or evidence extraluminal gas no evidence of bowel obstruction. Vascular/Lymphatic: Mild atherosclerotic disease of the abdominal aorta. No enlarged abdominal or pelvic lymph nodes. Reproductive: Uterus and bilateral adnexa are unremarkable. Other: Large ventral abdominal wall hernia, hernia neck measures 13.3 cm. No abdominopelvic ascites. Musculoskeletal: No acute or significant osseous findings. IMPRESSION: Findings compatible with acute uncomplicated appendicitis. Large ventral abdominal wall hernia containing a portion of the stomach, most of the large and small bowel, and the appendix. Aortic Atherosclerosis (ICD10-I70.0). These results will be called to the ordering clinician or representative by the Radiologist Assistant, and communication documented in the PACS or [employer]. Electronically Signed   By: Allegra Lai M.D.   On: 11/28/2020 13:45    ROS 10 point review of systems is negative except as listed above in HPI.   Physical Exam Blood pressure 130/72, pulse 82, temperature 98.3 F (36.8 C), resp. rate 19, SpO2 100 %. Constitutional: well-developed, well-nourished HEENT: pupils equal, round, reactive to light, 50mm b/l, moist conjunctiva, external  inspection  of ears and nose normal, hearing intact Oropharynx: normal oropharyngeal mucosa, normal dentition Neck: no thyromegaly, trachea midline, no midline cervical tenderness to palpation Chest: breath sounds equal bilaterally, normal respiratory effort, no midline or lateral chest wall tenderness to palpation/deformity Abdomen: soft, obese, R mid-abdomen and lower quadrant TTP, no bruising, no hepatosplenomegaly GU: normal female genitalia  Back: no wounds, no thoracic/lumbar spine tenderness to palpation, no thoracic/lumbar spine stepoffs Rectal: deferred Extremities: 2+ radial and pedal pulses bilaterally, intact motor and sensation bilateral UE and LE, no peripheral edema MSK: normal gait/station, no clubbing/cyanosis of fingers/toes, normal ROM of all four extremities Skin: warm, dry, no rashes Psych: normal memory, normal mood/affect    Assessment/Plan: 69F with acute appendicitis that appears to be uncomplicated in the setting of large ventral hernia with significant loss of domain. Due to ventral hernia with extensive loss of domain and appendix that appears to be within the hernia sac, patient may be a better candidate for an open appendectomy via laparotomy incision if surgical management is selected. Noted elevated beta hCG of 20. In the setting of patient age, peri-menopausal symptoms as described by the patient, and reported abstinence for the last 3y of sexual activity, suspect this is secondary to pituitary release rather than a developing pregnancy. FSH and LH sent to attempt to corroborate menopausal status, but these labs may not result very quickly. Recheck beta quant in AM as well. OB/Gyn c/s, as although an IUP may not change the management course, it would change the counseling provided to the patient during informed consent for appendectomy and in the setting of an extra-uterine pregnancy, would change the clinical picture rather significantly from an operative standpoint.  Patient also counseled on recommendation for overdue colonoscopy.   Diamantina Monks, MD General and Trauma Surgery [facility]

## 2020-11-28 NOTE — ED Provider Notes (Signed)
[facility] EMERGENCY DEPARTMENT Provider Note   CSN: [id number] Arrival date & time: 11/28/20  1646     History Chief Complaint  Patient presents with   Abdominal Pain    Gabriela Watkins is a 55 y.o. female.  Gabriela Watkins is a 55 y.o. female  with prior medical history significant for ventral hernia, small bowel obstruction, and diabetes mellitus who presents today for RLQ abdominal pain. The patient states the pain started on Saturday. Initially the pain was a sharp stabbing pain but she now describes the pain as a dull ache. The pain does not radiate. It is worse with walking. Relieved when she has a bowel movement. She states she has had some diarrhea. Last bowel movement this morning. She has had a decreased appetite and intermittent nausea as well. She had a mild fever and lightheadedness the first two days but has since resolved. Was seen by her PCP this morning who ordered a CT scan and sent her to the ED for appendicitis. Denies chest pain or shortness of breath. Has not taken any medications prior to arrival.  The history is provided by the patient.      Past Medical History:  Diagnosis Date   Anemia    High cholesterol    Hypertension    Small bowel obstruction [facility])    hospital admission 08/24/2012   Type II diabetes mellitus [facility])     Patient Active Problem List   Diagnosis Date Noted   Acute appendicitis 11/28/2020   Iron deficiency anemia 11/21/2016   Vitamin D deficiency 03/02/2015   Hyperlipidemia associated with type 2 diabetes mellitus ([place]) 08/02/2013   Small bowel obstruction ([place]) 08/27/2012   Diabetes mellitus ([place]) 08/27/2012   Hypertension associated with diabetes ([place]) 08/27/2012   Obesity, morbid ([place]) 08/27/2012   Hx of hernia repair 08/27/2012    Past Surgical History:  Procedure Laterality Date   HERNIA REPAIR  ~ 01/2009; 04/2009   "stomach" (08/25/2012)   LEFT OOPHORECTOMY  ~ 2004   "tumor" (08/25/2012)   RIGHT OOPHORECTOMY  ~  2007   "tumor; only took off a portion of the ovary" (08/25/2012)     OB History   No obstetric history on file.     Family History  Problem Relation Age of Onset   Diabetes Mother     Social History   Tobacco Use   Smoking status: Never   Smokeless tobacco: Never  Vaping Use   Vaping Use: Never used  Substance Use Topics   Alcohol use: No   Drug use: No    Home Medications Prior to Admission medications   Medication Sig Start Date End Date Taking? Authorizing Provider  aspirin EC 81 MG tablet Take 1 tablet (81 mg total) by mouth daily. 11/28/14   Evelina Dun A, FNP  atorvastatin (LIPITOR) 80 MG tablet TAKE 1 TABLET BY MOUTH EVERY DAY (NEEDS TO BE SEEN BEFORE NEXT REFILL) 07/12/20   Sharion Balloon, FNP  blood glucose meter kit and supplies KIT Dispense based on patient and insurance preference. Use up to four times daily as directed. (FOR ICD-9 250.00, 250.01). 06/01/15   Evelina Dun A, FNP  glucose blood (ACCU-CHEK GUIDE) test strip Test BS up to 4 times daily Dx E11.9 11/21/20   Sharion Balloon, FNP  Lancets 30G MISC Check blood glucose BID and prn 03/02/15   Evelina Dun A, FNP  lisinopril (ZESTRIL) 20 MG tablet TAKE 1 TABLET BY MOUTH EVERY DAY 11/21/20  Hawks, Christy A, FNP  metFORMIN (GLUCOPHAGE-XR) 500 MG 24 hr tablet TAKE 1 TABLET BY MOUTH EVERY DAY WITH BREAKFAST 07/05/20   Evelina Dun A, FNP  terbinafine (LAMISIL) 250 MG tablet Take 1 tablet (250 mg total) by mouth daily. 01/06/20   Sharion Balloon, FNP    Allergies    Penicillins  Review of Systems   Review of Systems  Constitutional:  Negative for chills and fever.  HENT: Negative.    Respiratory:  Negative for cough and shortness of breath.   Cardiovascular:  Negative for chest pain.  Gastrointestinal:  Positive for abdominal pain and diarrhea. Negative for nausea and vomiting.  Genitourinary:  Negative for dysuria and frequency.  Musculoskeletal:  Negative for arthralgias and myalgias.  Skin:   Negative for color change.  Neurological:  Negative for dizziness, syncope and light-headedness.  All other systems reviewed and are negative.  Physical Exam Updated Vital Signs BP 129/63   Pulse 81   Temp 98.3 F (36.8 C)   Resp 19   SpO2 96%   Physical Exam Vitals and nursing note reviewed.  Constitutional:      General: She is not in acute distress.    Appearance: Normal appearance. She is well-developed. She is not diaphoretic.  HENT:     Head: Normocephalic and atraumatic.  Eyes:     General:        Right eye: No discharge.        Left eye: No discharge.     Pupils: Pupils are equal, round, and reactive to light.  Cardiovascular:     Rate and Rhythm: Normal rate and regular rhythm.     Pulses: Normal pulses.     Heart sounds: Normal heart sounds.  Pulmonary:     Effort: Pulmonary effort is normal. No respiratory distress.     Breath sounds: Normal breath sounds. No wheezing or rales.     Comments: Respirations equal and unlabored, patient able to speak in full sentences, lungs clear to auscultation bilaterally  Abdominal:     General: Bowel sounds are normal. There is no distension.     Palpations: Abdomen is soft. There is no mass.     Tenderness: There is abdominal tenderness in the right lower quadrant. There is no guarding.     Comments: Abdomen soft, nondistended, focal RLQ tenderness with slight guarding, + tenderness at Mcburney's point, all other quadrants NTTP,  Musculoskeletal:        General: No deformity.     Cervical back: Neck supple.  Skin:    General: Skin is warm and dry.     Capillary Refill: Capillary refill takes less than 2 seconds.  Neurological:     Mental Status: She is alert and oriented to person, place, and time.     Coordination: Coordination normal.     Comments: Speech is clear, able to follow commands Moves extremities without ataxia, coordination intact  Psychiatric:        Mood and Affect: Mood normal.        Behavior: Behavior  normal.    ED Results / Procedures / Treatments   Labs (all labs ordered are listed, but only abnormal results are displayed) Labs Reviewed  CBC WITH DIFFERENTIAL/PLATELET - Abnormal; Notable for the following components:      Result Value   Hemoglobin 10.7 (*)    HCT 34.7 (*)    Monocytes Absolute 1.1 (*)    All other components within normal limits  COMPREHENSIVE METABOLIC PANEL -  Abnormal; Notable for the following components:   Sodium 132 (*)    Glucose, Bld 119 (*)    BUN 27 (*)    Creatinine, Ser 1.01 (*)    Calcium 8.7 (*)    Albumin 3.2 (*)    AST 12 (*)    Alkaline Phosphatase 132 (*)    All other components within normal limits  URINALYSIS, ROUTINE W REFLEX MICROSCOPIC - Abnormal; Notable for the following components:   APPearance HAZY (*)    Specific Gravity, Urine >1.046 (*)    Protein, ur 100 (*)    Leukocytes,Ua SMALL (*)    Bacteria, UA FEW (*)    Non Squamous Epithelial 0-5 (*)    All other components within normal limits  I-STAT BETA HCG BLOOD, ED ([facility], [facility], [facility] ONLY) - Abnormal; Notable for the following components:   I-stat hCG, quantitative 20.1 (*)    All other components within normal limits  RESP PANEL BY RT-PCR (FLU A&B, COVID) ARPGX2  LIPASE, BLOOD  HCG, QUANTITATIVE, PREGNANCY  FOLLICLE STIMULATING HORMONE  LUTEINIZING HORMONE  HIV ANTIBODY (ROUTINE TESTING W REFLEX)  BASIC METABOLIC PANEL  CBC    EKG None  Radiology CT Abdomen Pelvis W Contrast  Result Date: 11/28/2020 CLINICAL DATA:  Right lower quadrant pain EXAM: CT ABDOMEN AND PELVIS WITH CONTRAST TECHNIQUE: Multidetector CT imaging of the abdomen and pelvis was performed using the standard protocol following bolus administration of intravenous contrast. CONTRAST:  181m OMNIPAQUE IOHEXOL 300 MG/ML  SOLN COMPARISON:  CT abdomen and pelvis dated January 26, 2016 FINDINGS: Lower chest: Mild opacities of the lower left lung, likely atelectasis. No acute abnormality. Hepatobiliary: No focal  liver abnormality is seen. No gallstones, gallbladder wall thickening, or biliary dilatation. Pancreas: Unremarkable. No pancreatic ductal dilatation or surrounding inflammatory changes. Spleen: Normal in size without focal abnormality. Adrenals/Urinary Tract: Adrenal glands are unremarkable. Kidneys are normal, without renal calculi, focal lesion, or hydronephrosis. Bladder is decompressed. Stomach/Bowel: Large ventral abdominal wall hernia containing a portion of the stomach and most of the large and small bowel. Appendix is dilated, measuring up to 1.1 cm with adjacent inflammatory change. Wall thickening of the cecum, likely reactive. No organized fluid collections or evidence extraluminal gas no evidence of bowel obstruction. Vascular/Lymphatic: Mild atherosclerotic disease of the abdominal aorta. No enlarged abdominal or pelvic lymph nodes. Reproductive: Uterus and bilateral adnexa are unremarkable. Other: Large ventral abdominal wall hernia, hernia neck measures 13.3 cm. No abdominopelvic ascites. Musculoskeletal: No acute or significant osseous findings. IMPRESSION: Findings compatible with acute uncomplicated appendicitis. Large ventral abdominal wall hernia containing a portion of the stomach, most of the large and small bowel, and the appendix. Aortic Atherosclerosis (ICD10-I70.0). These results will be called to the ordering clinician or representative by the Radiologist Assistant, and communication documented in the PACS or [employer] Electronically Signed   By: LYetta GlassmanM.D.   On: 11/28/2020 13:45    Procedures Procedures   Medications Ordered in ED Medications  lactated ringers infusion ( Intravenous New Bag/Given 11/28/20 2038)  morphine 4 MG/ML injection 4 mg (has no administration in time range)  enoxaparin (LOVENOX) injection 40 mg (has no administration in time range)  lactated ringers infusion (has no administration in time range)  cefTRIAXone (ROCEPHIN) 2 g in sodium  chloride 0.9 % 100 mL IVPB (has no administration in time range)    And  metroNIDAZOLE (FLAGYL) IVPB 500 mg (has no administration in time range)  ondansetron (ZOFRAN-ODT) disintegrating tablet 4 mg (has no  administration in time range)    Or  ondansetron (ZOFRAN) injection 4 mg (has no administration in time range)  docusate sodium (COLACE) capsule 100 mg (has no administration in time range)  acetaminophen (TYLENOL) tablet 1,000 mg (has no administration in time range)  methocarbamol (ROBAXIN) tablet 1,000 mg (has no administration in time range)  morphine 2 MG/ML injection 2-4 mg (has no administration in time range)  oxyCODONE (ROXICODONE) 5 MG/5ML solution 5-10 mg (has no administration in time range)  cefTRIAXone (ROCEPHIN) 2 g in sodium chloride 0.9 % 100 mL IVPB (0 g Intravenous Stopped 11/28/20 2036)    And  metroNIDAZOLE (FLAGYL) IVPB 500 mg (0 mg Intravenous Stopped 11/28/20 2213)    ED Course  I have reviewed the triage vital signs and the nursing notes.  Pertinent labs & imaging results that were available during my care of the patient were reviewed by me and considered in my medical decision making (see chart for details).    MDM Rules/Calculators/A&P                           Patient presents to the ED with complaints of abdominal pain. Had outpatient CT positive for appendcitis.  Patient nontoxic appearing, in no apparent distress, vitals WNL. On exam patient tender to RLQ, no peritoneal signs. Analgesics, anti-emetics, and fluids administered.   Additional history obtained:  Additional history obtained from chart review & nursing note review.   Lab Tests:  I Ordered, reviewed, and interpreted labs, which included:  CBC: No leukocytosis, Hgb stable CMP: No significant electrolyte derangements requiring intervention, normal renal function, alk phos minimally elevated, but LFTs otherwise WNL Lipase: WNL UA: small leukocytes, squamous cells present, not concerning for  infection Preg test: Neg  Outpatient CT reviewed and consistent with uncomplicated acute appendicitis  Pt started on rocephin and flagyl, Dr. Bobbye Morton with general surgery consulted and will see admit patient for appendectomy.   Portions of this note were generated with [employer]. Dictation errors may occur despite best attempts at proofreading.     Final Clinical Impression(s) / ED Diagnoses Final diagnoses:  Uncomplicated acute appendicitis    Rx / DC Orders ED Discharge Orders     None        Janet Berlin 11/28/20 2305    Godfrey Pick, MD 11/29/20 [phone]

## 2020-11-28 NOTE — Consult Note (Addendum)
Reason for Consult: positive HCG Referring Physician: Dr. Shawn Route Gabriela Watkins is an 55 y.o. female who is being admitted to the hospital for appendicitis. She had a beta HCG as she is still menstruating albeit irregularly and it was positive at 20. The patient denies being even possibly pregnant - she had her ovaries removed and when removed she was told she wouldn't be able to get pregnant. They also told her they did not remove the whole ovary on the right side so that she would not go into menopause. She notes this surgery was when she was 63 at a hospital in [place]. She also reports she cannot be pregnant because she is not sexually active.   She reports regular menses until about 3 years ago. Now they skip sometimes going 3-6 months. Her LMP was in late August and prior to that her last period was in February.   She denies a current gynecologist.   Pertinent Gynecological History: Menses:  see HPI Bleeding: appropriate for perimenopausal bleeding Contraception: none DES exposure: denies Sexually transmitted diseases: no past history Previous GYN Procedures:  see PSH   Last mammogram: normal Date: 01/2020 Last pap: 09/2016 - normal, no HPV done    Past Medical History:  Diagnosis Date   Anemia    High cholesterol    Hypertension    Small bowel obstruction (HCC)    hospital admission 08/24/2012   Type II diabetes mellitus (HCC)     Past Surgical History:  Procedure Laterality Date   HERNIA REPAIR  ~ 01/2009; 04/2009   "stomach" (08/25/2012)   LEFT OOPHORECTOMY  ~ 2004   "tumor" (08/25/2012)   RIGHT OOPHORECTOMY  ~ 2007   "tumor; only took off a portion of the ovary" (08/25/2012)    Family History  Problem Relation Age of Onset   Diabetes Mother     Social History:  reports that she has never smoked. She has never used smokeless tobacco. She reports that she does not drink alcohol and does not use drugs.  Allergies:  Allergies  Allergen Reactions   Penicillins Swelling     Medications: I have reviewed the patient's current medications.  Review of Systems  HENT: Negative.    Gastrointestinal:  Positive for abdominal pain.  Endocrine: Negative.   Genitourinary: Negative.   Musculoskeletal: Negative.   Skin: Negative.   Hematological: Negative.   Psychiatric/Behavioral: Negative.     Blood pressure 130/71, pulse 88, temperature 98.3 F (36.8 C), resp. rate 19, SpO2 100 %. Physical Exam Constitutional:      Appearance: She is obese.  HENT:     Head: Normocephalic and atraumatic.  Cardiovascular:     Rate and Rhythm: Normal rate.  Pulmonary:     Effort: Pulmonary effort is normal.  Skin:    General: Skin is warm and dry.     Capillary Refill: Capillary refill takes less than 2 seconds.  Neurological:     General: No focal deficit present.     Mental Status: She is alert and oriented to person, place, and time.  Psychiatric:        Mood and Affect: Mood normal.        Behavior: Behavior normal.    Results for orders placed or performed during the hospital encounter of 11/28/20 (from the past 48 hour(s))  CBC with Differential     Status: Abnormal   Collection Time: 11/28/20  5:22 PM  Result Value Ref Range   WBC 10.5 4.0 - 10.5  K/uL   RBC 3.89 3.87 - 5.11 MIL/uL   Hemoglobin 10.7 (L) 12.0 - 15.0 g/dL   HCT 89.3 (L) 81.0 - 17.5 %   MCV 89.2 80.0 - 100.0 fL   MCH 27.5 26.0 - 34.0 pg   MCHC 30.8 30.0 - 36.0 g/dL   RDW 10.2 58.5 - 27.7 %   Platelets 363 150 - 400 K/uL   nRBC 0.0 0.0 - 0.2 %   Neutrophils Relative % 68 %   Neutro Abs 7.3 1.7 - 7.7 K/uL   Lymphocytes Relative 19 %   Lymphs Abs 2.0 0.7 - 4.0 K/uL   Monocytes Relative 10 %   Monocytes Absolute 1.1 (H) 0.1 - 1.0 K/uL   Eosinophils Relative 1 %   Eosinophils Absolute 0.1 0.0 - 0.5 K/uL   Basophils Relative 1 %   Basophils Absolute 0.1 0.0 - 0.1 K/uL   Immature Granulocytes 1 %   Abs Immature Granulocytes 0.05 0.00 - 0.07 K/uL    Comment: Performed at [facility], [address], [place], [name] [address]  Comprehensive metabolic panel     Status: Abnormal   Collection Time: 11/28/20  5:22 PM  Result Value Ref Range   Sodium 132 (L) 135 - 145 mmol/L   Potassium 3.9 3.5 - 5.1 mmol/L   Chloride 98 98 - 111 mmol/L   CO2 24 22 - 32 mmol/L   Glucose, Bld 119 (H) 70 - 99 mg/dL    Comment: Glucose reference range applies only to samples taken after fasting for at least 8 hours.   BUN 27 (H) 6 - 20 mg/dL   Creatinine, Ser 5.36 (H) 0.44 - 1.00 mg/dL   Calcium 8.7 (L) 8.9 - 10.3 mg/dL   Total Protein 6.5 6.5 - 8.1 g/dL   Albumin 3.2 (L) 3.5 - 5.0 g/dL   AST 12 (L) 15 - 41 U/L   ALT 15 0 - 44 U/L   Alkaline Phosphatase 132 (H) 38 - 126 U/L   Total Bilirubin 1.0 0.3 - 1.2 mg/dL   GFR, Estimated >14 >43 mL/min    Comment: (NOTE) Calculated using the CKD-EPI Creatinine Equation (2021)    Anion gap 10 5 - 15    Comment: Performed at [facility], [address], [place], [name] [address]  Lipase, blood     Status: None   Collection Time: 11/28/20  5:22 PM  Result Value Ref Range   Lipase 34 11 - 51 U/L    Comment: Performed at [facility], [address], [place], [name] [address]  Urinalysis, Routine w reflex microscopic     Status: Abnormal   Collection Time: 11/28/20  5:22 PM  Result Value Ref Range   Color, Urine YELLOW YELLOW   APPearance HAZY (A) CLEAR   Specific Gravity, Urine >1.046 (H) 1.005 - 1.030   pH 5.0 5.0 - 8.0   Glucose, UA NEGATIVE NEGATIVE mg/dL   Hgb urine dipstick NEGATIVE NEGATIVE   Bilirubin Urine NEGATIVE NEGATIVE   Ketones, ur NEGATIVE NEGATIVE mg/dL   Protein, ur 950 (A) NEGATIVE mg/dL   Nitrite NEGATIVE NEGATIVE   Leukocytes,Ua SMALL (A) NEGATIVE   RBC / HPF 0-5 0 - 5 RBC/hpf   WBC, UA 11-20 0 - 5 WBC/hpf   Bacteria, UA FEW (A) NONE SEEN   Squamous Epithelial / LPF 11-20 0 - 5   Mucus PRESENT    Granular Casts, UA PRESENT    Non Squamous Epithelial 0-5 (  A) NONE SEEN    Comment: Performed  at [facility], [address] [place], [name] [address]  Resp Panel by RT-PCR (Flu A&B, Covid) Nasopharyngeal Swab     Status: None   Collection Time: 11/28/20  5:22 PM   Specimen: Nasopharyngeal Swab; Nasopharyngeal(NP) swabs in vial transport medium  Result Value Ref Range   SARS Coronavirus 2 by RT PCR NEGATIVE NEGATIVE    Comment: (NOTE) SARS-CoV-2 target nucleic acids are NOT DETECTED.  The SARS-CoV-2 RNA is generally detectable in upper respiratory specimens during the acute phase of infection. The lowest concentration of SARS-CoV-2 viral copies this assay can detect is 138 copies/mL. A negative result does not preclude SARS-Cov-2 infection and should not be used as the sole basis for treatment or other patient management decisions. A negative result may occur with  improper specimen collection/handling, submission of specimen other than nasopharyngeal swab, presence of viral mutation(s) within the areas targeted by this assay, and inadequate number of viral copies(<138 copies/mL). A negative result must be combined with clinical observations, patient history, and epidemiological information. The expected result is Negative.  Fact Sheet for Patients:  BloggerCourse.com  Fact Sheet for Healthcare Providers:  SeriousBroker.it  This test is no t yet approved or cleared by the [place] FDA and  has been authorized for detection and/or diagnosis of SARS-CoV-2 by FDA under an Emergency Use Authorization (EUA). This EUA will remain  in effect (meaning this test can be used) for the duration of the COVID-19 declaration under Section 564(b)(1) of the Act, 21 U.S.C.section 360bbb-3(b)(1), unless the authorization is terminated  or revoked sooner.       Influenza A by PCR NEGATIVE NEGATIVE   Influenza B by PCR NEGATIVE NEGATIVE    Comment: (NOTE) The Xpert Xpress SARS-CoV-2/FLU/RSV plus assay is intended as an aid in  the diagnosis of influenza from Nasopharyngeal swab specimens and should not be used as a sole basis for treatment. Nasal washings and aspirates are unacceptable for Xpert Xpress SARS-CoV-2/FLU/RSV testing.  Fact Sheet for Patients: BloggerCourse.com  Fact Sheet for Healthcare Providers: SeriousBroker.it  This test is not yet approved or cleared by the [place] FDA and has been authorized for detection and/or diagnosis of SARS-CoV-2 by FDA under an Emergency Use Authorization (EUA). This EUA will remain in effect (meaning this test can be used) for the duration of the COVID-19 declaration under Section 564(b)(1) of the Act, 21 U.S.C. section 360bbb-3(b)(1), unless the authorization is terminated or revoked.  Performed at [facility], [address], [place], [name] [address]   I-Stat beta hCG blood, ED     Status: Abnormal   Collection Time: 11/28/20  5:45 PM  Result Value Ref Range   I-stat hCG, quantitative 20.1 (H) <5 mIU/mL   Comment 3            Comment:   GEST. AGE      CONC.  (mIU/mL)   <=1 WEEK        5 - 50     2 WEEKS       50 - 500     3 WEEKS       100 - 10,000     4 WEEKS     1,000 - 30,000        FEMALE AND NON-PREGNANT FEMALE:     LESS THAN 5 mIU/mL     CT Abdomen Pelvis W Contrast  Result Date: 11/28/2020 CLINICAL DATA:  Right lower quadrant  pain EXAM: CT ABDOMEN AND PELVIS WITH CONTRAST TECHNIQUE: Multidetector CT imaging of the abdomen and pelvis was performed using the standard protocol following bolus administration of intravenous contrast. CONTRAST:  OMNIPAQUE IOHEXOL 300 MG/ML  SOLN COMPARISON:  CT abdomen and pelvis dated January 26, 2016 FINDINGS: Lower chest: Mild opacities of the lower left lung, likely atelectasis. No acute abnormality. Hepatobiliary: No focal liver abnormality is seen. No gallstones, gallbladder wall thickening, or biliary dilatation. Pancreas: Unremarkable. No  pancreatic ductal dilatation or surrounding inflammatory changes. Spleen: Normal in size without focal abnormality. Adrenals/Urinary Tract: Adrenal glands are unremarkable. Kidneys are normal, without renal calculi, focal lesion, or hydronephrosis. Bladder is decompressed. Stomach/Bowel: Large ventral abdominal wall hernia containing a portion of the stomach and most of the large and small bowel. Appendix is dilated, measuring up to 1.1 cm with adjacent inflammatory change. Wall thickening of the cecum, likely reactive. No organized fluid collections or evidence extraluminal gas no evidence of bowel obstruction. Vascular/Lymphatic: Mild atherosclerotic disease of the abdominal aorta. No enlarged abdominal or pelvic lymph nodes. Reproductive: Uterus and bilateral adnexa are unremarkable. Other: Large ventral abdominal wall hernia, hernia neck measures 13.3 cm. No abdominopelvic ascites. Musculoskeletal: No acute or significant osseous findings. IMPRESSION: Findings compatible with acute uncomplicated appendicitis. Large ventral abdominal wall hernia containing a portion of the stomach, most of the large and small bowel, and the appendix. Aortic Atherosclerosis (ICD10-I70.0). These results will be called to the ordering clinician or representative by the Radiologist Assistant, and communication documented in the PACS or [employer]. Electronically Signed   By: Allegra Lai M.D.   On: 11/28/2020 13:45    Assessment/Plan: Low level HCG elevation - Most likely etiology is related to the fact that she is perimenopausal given the value is 20. Assuming it is not rising on recheck tomorrow, I would proceed with the fact that this is likely due pituitary release  - LH and FSH can be used to confirm but given the fact that she is still having irregular cycles, they may still not be fully in post-menopausal range despite her bleeding pattern (and age).  - Even in the event of pregnancy (which we have multiple  factors to rule this possibility out), I would still recommend management of the appendicitis as indicated whether antibiotics or surgery - Appreciate the consult. If any additional questions, please feel free to consult [place] again or call via "[facility] 2nd Attending" on Vocera.   - Pt is spanish speaking. An interpreter was used throughout our interview: Vivia Ewing #[id number]  Milas Hock, MD 11/28/2020

## 2020-11-28 NOTE — ED Provider Notes (Signed)
Emergency Medicine Provider Triage Evaluation Note  DAJAHNAE VONDRA , a 55 y.o. female  was evaluated in triage.  Pt complains of abd pain x3 days, also with fevers. Seen by pcp and dx with appendicitis.  Review of Systems  Positive: Abd pain, fever Negative: nv  Physical Exam  BP (!) 108/58 (BP Location: Right Arm)   Pulse 84   Temp 99.4 F (37.4 C) (Oral)   Resp 18   SpO2 95%  Gen:   Awake, no distress   Resp:  Normal effort  MSK:   Moves extremities without difficulty  Other:  RLQ ttp with guarding  Medical Decision Making  Medically screening exam initiated at 5:21 PM.  Appropriate orders placed.  ELIDIA BONENFANT was informed that the remainder of the evaluation will be completed by another provider, this initial triage assessment does not replace that evaluation, and the importance of remaining in the ED until their evaluation is complete.     Rayne Du 11/28/20 1722    Maia Plan, MD 11/28/20 2215

## 2020-11-28 NOTE — ED Notes (Addendum)
PA at bedside - family at bedside to help translate for patient

## 2020-11-28 NOTE — Progress Notes (Signed)
BP 121/79   Pulse 93   Ht $R'5\' 2"'Ii$  (1.575 m)   Wt 275 lb (124.7 kg)   SpO2 95%   BMI 50.30 kg/m    Subjective:   Patient ID: Gabriela Watkins, female    DOB: [birth year], 55 y.o.   MRN: [id number]  HPI: Gabriela Watkins is a 55 y.o. female presenting on 11/28/2020 for Abdominal Pain (RLQ)   HPI Patient is coming in complaining of abdominal pain in the right lower quadrant that started about 4 days ago.  She has had nausea and feeling lightheaded and dizzy and feeling a little feverish initially.  She does not feel feverish anymore but still has some nausea and is not eating very much.  The pain may have improved a little bit but not much over the past few days.  She does say that it feels better when she has a bowel movement and she has had some diarrhea with it and it does feel better when she passes a bowel movement.  She does have a history of a ventral hernia that has been repaired.  She does have a history of small bowel obstruction in the past as well.  Relevant past medical, surgical, family and social history reviewed and updated as indicated. Interim medical history since our last visit reviewed. Allergies and medications reviewed and updated.  Review of Systems  Constitutional:  Negative for chills and fever.  Eyes:  Negative for visual disturbance.  Respiratory:  Negative for chest tightness and shortness of breath.   Cardiovascular:  Negative for chest pain and leg swelling.  Gastrointestinal:  Positive for abdominal distention, abdominal pain, diarrhea and nausea. Negative for vomiting.  Genitourinary:  Negative for dysuria and hematuria.  Musculoskeletal:  Negative for back pain and gait problem.  Skin:  Negative for rash.  Neurological:  Negative for light-headedness and headaches.  Psychiatric/Behavioral:  Negative for agitation and behavioral problems.   All other systems reviewed and are negative.  Per HPI unless specifically indicated above   Allergies as of 11/28/2020        Reactions   Penicillins Swelling        Medication List        Accurate as of November 28, 2020  9:00 AM. If you have any questions, ask your nurse or doctor.          STOP taking these medications    azithromycin 250 MG tablet Commonly known as: ZITHROMAX Stopped by: Fransisca Kaufmann Tegan Burnside, MD   dexamethasone 6 MG tablet Commonly known as: DECADRON Stopped by: Worthy Rancher, MD   IRON PO Stopped by: Fransisca Kaufmann Tkai Serfass, MD   Phentermine-Topiramate 7.5-46 MG Cp24 Stopped by: Fransisca Kaufmann Saphyre Cillo, MD       TAKE these medications    Accu-Chek Guide test strip Generic drug: glucose blood Test BS up to 4 times daily Dx E11.9   aspirin EC 81 MG tablet Take 1 tablet (81 mg total) by mouth daily.   atorvastatin 80 MG tablet Commonly known as: LIPITOR TAKE 1 TABLET BY MOUTH EVERY DAY (NEEDS TO BE SEEN BEFORE NEXT REFILL)   blood glucose meter kit and supplies Kit Dispense based on patient and insurance preference. Use up to four times daily as directed. (FOR ICD-9 250.00, 250.01).   Lancets 30G Misc Check blood glucose BID and prn   lisinopril 20 MG tablet Commonly known as: ZESTRIL TAKE 1 TABLET BY MOUTH EVERY DAY   metFORMIN 500 MG 24 hr tablet  Commonly known as: GLUCOPHAGE-XR TAKE 1 TABLET BY MOUTH EVERY DAY WITH BREAKFAST   terbinafine 250 MG tablet Commonly known as: LAMISIL Take 1 tablet (250 mg total) by mouth daily.         Objective:   BP 121/79   Pulse 93   Ht $R'5\' 2"'su$  (1.575 m)   Wt 275 lb (124.7 kg)   SpO2 95%   BMI 50.30 kg/m   Wt Readings from Last 3 Encounters:  11/28/20 275 lb (124.7 kg)  01/06/20 275 lb 9.6 oz (125 kg)  05/27/19 275 lb 3.2 oz (124.8 kg)    Physical Exam Vitals and nursing note reviewed.  Constitutional:      General: She is not in acute distress.    Appearance: She is well-developed. She is not diaphoretic.  Eyes:     Conjunctiva/sclera: Conjunctivae normal.  Cardiovascular:     Rate and Rhythm:  Normal rate and regular rhythm.     Heart sounds: Normal heart sounds. No murmur heard. Pulmonary:     Effort: Pulmonary effort is normal. No respiratory distress.     Breath sounds: Normal breath sounds. No wheezing.  Abdominal:     General: Abdomen is flat. A surgical scar is present.     Palpations: Abdomen is soft. There is no hepatomegaly, splenomegaly or mass.     Tenderness: There is abdominal tenderness in the right lower quadrant.     Hernia: No hernia is present.    Skin:    General: Skin is warm and dry.     Findings: No rash.  Neurological:     Mental Status: She is alert and oriented to person, place, and time.     Coordination: Coordination normal.  Psychiatric:        Behavior: Behavior normal.   KUB: Did not give a good picture due to patient's body habitus.  Await final read from radiology.   Assessment & Plan:   Problem List Items Addressed This Visit   None Visit Diagnoses     RLQ abdominal pain    -  Primary   Relevant Orders   DG Abd 1 View   Right lower quadrant abdominal pain       Relevant Orders   CT Abdomen Pelvis W Contrast       Will do CT abdomen pelvis to rule out colitis versus diverticulitis versus appendicitis. Follow up plan: Return if symptoms worsen or fail to improve.  Counseling provided for all of the vaccine components Orders Placed This Encounter  Procedures   DG Abd 1 View   CT Abdomen Pelvis [place] Markeise Mathews, MD [facility] 11/28/2020, 9:00 AM

## 2020-11-28 NOTE — Telephone Encounter (Signed)
[facility] ER called. Spoke to [place]. Informed that pt has acute abdominal pain with appendicitis and that pt is on the way to the ER.

## 2020-11-29 DIAGNOSIS — Z7982 Long term (current) use of aspirin: Secondary | ICD-10-CM | POA: Diagnosis not present

## 2020-11-29 DIAGNOSIS — Z7984 Long term (current) use of oral hypoglycemic drugs: Secondary | ICD-10-CM | POA: Diagnosis not present

## 2020-11-29 DIAGNOSIS — Z20822 Contact with and (suspected) exposure to covid-19: Secondary | ICD-10-CM | POA: Diagnosis present

## 2020-11-29 DIAGNOSIS — Z880 Allergy status to penicillin: Secondary | ICD-10-CM | POA: Diagnosis not present

## 2020-11-29 DIAGNOSIS — Z833 Family history of diabetes mellitus: Secondary | ICD-10-CM | POA: Diagnosis not present

## 2020-11-29 DIAGNOSIS — K439 Ventral hernia without obstruction or gangrene: Secondary | ICD-10-CM | POA: Diagnosis present

## 2020-11-29 DIAGNOSIS — Z6841 Body Mass Index (BMI) 40.0 and over, adult: Secondary | ICD-10-CM | POA: Diagnosis not present

## 2020-11-29 DIAGNOSIS — Z79899 Other long term (current) drug therapy: Secondary | ICD-10-CM | POA: Diagnosis not present

## 2020-11-29 DIAGNOSIS — E7800 Pure hypercholesterolemia, unspecified: Secondary | ICD-10-CM | POA: Diagnosis present

## 2020-11-29 DIAGNOSIS — Z90722 Acquired absence of ovaries, bilateral: Secondary | ICD-10-CM | POA: Diagnosis not present

## 2020-11-29 DIAGNOSIS — K3580 Unspecified acute appendicitis: Secondary | ICD-10-CM | POA: Diagnosis present

## 2020-11-29 DIAGNOSIS — I10 Essential (primary) hypertension: Secondary | ICD-10-CM | POA: Diagnosis present

## 2020-11-29 DIAGNOSIS — E119 Type 2 diabetes mellitus without complications: Secondary | ICD-10-CM | POA: Diagnosis present

## 2020-11-29 LAB — BASIC METABOLIC PANEL
Anion gap: 11 (ref 5–15)
BUN: 24 mg/dL — ABNORMAL HIGH (ref 6–20)
CO2: 23 mmol/L (ref 22–32)
Calcium: 8.6 mg/dL — ABNORMAL LOW (ref 8.9–10.3)
Chloride: 102 mmol/L (ref 98–111)
Creatinine, Ser: 0.77 mg/dL (ref 0.44–1.00)
GFR, Estimated: >60 mL/min (ref >60)
Glucose, Bld: 135 mg/dL — ABNORMAL HIGH (ref 70–99)
Potassium: 3.3 mmol/L — ABNORMAL LOW (ref 3.5–5.1)
Sodium: 136 mmol/L (ref 135–145)

## 2020-11-29 LAB — CBC
HCT: 31.6 % — ABNORMAL LOW (ref 36.0–46.0)
Hemoglobin: 10 g/dL — ABNORMAL LOW (ref 12.0–15.0)
MCH: 27.4 pg (ref 26.0–34.0)
MCHC: 31.6 g/dL (ref 30.0–36.0)
MCV: 86.6 fL (ref 80.0–100.0)
Platelets: 301 10*3/uL (ref 150–400)
RBC: 3.65 MIL/uL — ABNORMAL LOW (ref 3.87–5.11)
RDW: 13.4 % (ref 11.5–15.5)
WBC: 6.8 10*3/uL (ref 4.0–10.5)
nRBC: 0 % (ref 0.0–0.2)

## 2020-11-29 LAB — HCG, QUANTITATIVE, PREGNANCY: hCG, Beta Chain, Quant, S: 2 m[IU]/mL (ref <5)

## 2020-11-29 LAB — HIV ANTIBODY (ROUTINE TESTING W REFLEX): HIV Screen 4th Generation wRfx: NONREACTIVE

## 2020-11-29 LAB — GLUCOSE, CAPILLARY: Glucose-Capillary: 107 mg/dL — ABNORMAL HIGH (ref 70–99)

## 2020-11-29 NOTE — Progress Notes (Signed)
Subjective/Chief Complaint: Patient reports less RLQ tenderness today WBC normal - 6.8 Afebrile No nausea or vomiting  Interpreter via iPad   Objective: Vital signs in last 24 hours: Temp:  [98.3 F (36.8 C)-99.4 F (37.4 C)] 98.3 F (36.8 C) (10/12 0336) Pulse Rate:  [68-97] 68 (10/12 0745) Resp:  [16-20] 19 (10/12 0745) BP: (103-133)/(53-77) 117/74 (10/12 0745) SpO2:  [90 %-100 %] 95 % (10/12 0745)    Intake/Output from previous day: 10/11 0701 - 10/12 0700 In: 854 [I.V.:854] Out: -  Intake/Output this shift: No intake/output data recorded.  General appearance: alert, cooperative, and no distress GI: morbidly obese with massive ventral hernia with loss of domain; some RLQ tenderness (probably over the hernia sac) but no generalized peritonitis  Lab Results:  Recent Labs    11/28/20 1722 11/29/20 0402  WBC 10.5 6.8  HGB 10.7* 10.0*  HCT 34.7* 31.6*  PLT 363 301   BMET Recent Labs    11/28/20 1722 11/29/20 0402  NA 132* 136  K 3.9 3.3*  CL 98 102  CO2 24 23  GLUCOSE 119* 135*  BUN 27* 24*  CREATININE 1.01* 0.77  CALCIUM 8.7* 8.6*   PT/INR No results for input(s): LABPROT, INR in the last 72 hours. ABG No results for input(s): PHART, HCO3 in the last 72 hours.  Invalid input(s): PCO2, PO2  Studies/Results: CT Abdomen Pelvis W Contrast  Result Date: 11/28/2020 CLINICAL DATA:  Right lower quadrant pain EXAM: CT ABDOMEN AND PELVIS WITH CONTRAST TECHNIQUE: Multidetector CT imaging of the abdomen and pelvis was performed using the standard protocol following bolus administration of intravenous contrast. CONTRAST:  OMNIPAQUE IOHEXOL 300 MG/ML  SOLN COMPARISON:  CT abdomen and pelvis dated January 26, 2016 FINDINGS: Lower chest: Mild opacities of the lower left lung, likely atelectasis. No acute abnormality. Hepatobiliary: No focal liver abnormality is seen. No gallstones, gallbladder wall thickening, or biliary dilatation. Pancreas: Unremarkable.  No pancreatic ductal dilatation or surrounding inflammatory changes. Spleen: Normal in size without focal abnormality. Adrenals/Urinary Tract: Adrenal glands are unremarkable. Kidneys are normal, without renal calculi, focal lesion, or hydronephrosis. Bladder is decompressed. Stomach/Bowel: Large ventral abdominal wall hernia containing a portion of the stomach and most of the large and small bowel. Appendix is dilated, measuring up to 1.1 cm with adjacent inflammatory change. Wall thickening of the cecum, likely reactive. No organized fluid collections or evidence extraluminal gas no evidence of bowel obstruction. Vascular/Lymphatic: Mild atherosclerotic disease of the abdominal aorta. No enlarged abdominal or pelvic lymph nodes. Reproductive: Uterus and bilateral adnexa are unremarkable. Other: Large ventral abdominal wall hernia, hernia neck measures 13.3 cm. No abdominopelvic ascites. Musculoskeletal: No acute or significant osseous findings. IMPRESSION: Findings compatible with acute uncomplicated appendicitis. Large ventral abdominal wall hernia containing a portion of the stomach, most of the large and small bowel, and the appendix. Aortic Atherosclerosis (ICD10-I70.0). These results will be called to the ordering clinician or representative by the Radiologist Assistant, and communication documented in the PACS or [employer]. Electronically Signed   By: Allegra Lai M.D.   On: 11/28/2020 13:45    Anti-infectives: Anti-infectives (From admission, onward)    Start     Dose/Rate Route Frequency Ordered Stop   11/29/20 1900  cefTRIAXone (ROCEPHIN) 2 g in sodium chloride 0.9 % 100 mL IVPB       See Hyperspace for full Linked Orders Report.   2 g 200 mL/hr over 30 Minutes Intravenous Every 24 hours 11/28/20 2233 12/06/20 1859   11/29/20  0800  metroNIDAZOLE (FLAGYL) IVPB 500 mg       See Hyperspace for full Linked Orders Report.   500 mg 100 mL/hr over 60 Minutes Intravenous Every 12 hours  11/28/20 2233 12/06/20 0759   11/28/20 1930  cefTRIAXone (ROCEPHIN) 2 g in sodium chloride 0.9 % 100 mL IVPB       See Hyperspace for full Linked Orders Report.   2 g 200 mL/hr over 30 Minutes Intravenous  Once 11/28/20 1922 11/28/20 2036   11/28/20 1930  metroNIDAZOLE (FLAGYL) IVPB 500 mg       See Hyperspace for full Linked Orders Report.   500 mg 100 mL/hr over 60 Minutes Intravenous  Once 11/28/20 1922 11/28/20 2213       Assessment/Plan: Acute appendicitis - uncomplicated  Morbid obesity Long-standing ventral hernia with loss of domain - appendix seems to be in the hernia sac DM2 HTN Hypercholesterolemia  Ordinarily, appendectomy would be the treatment of choice.  However, her surgical options are affected by her massive hernia and the location of the appendix within the hernia sac.  There is no good option for a laparoscopic approach.  Open surgery would involve just cutting into the hernia sac to find and remove the appendix.  There would only be hernia sac and skin to close over the bowel in the hernia.  If there were any wound problems, then she would eviscerate.    We will first attempt non-operative management with IV antibiotics and bowel rest.  If her condition worsens, then we will discuss surgical options.  Wilmon Arms. Corliss Skains, MD, [facility]  General Surgery   11/29/2020 8:50 AM

## 2020-11-29 NOTE — ED Notes (Signed)
Pt ambulatory to restroom with no assistance.

## 2020-11-29 NOTE — ED Notes (Signed)
Pt up to bathroom without difficulty.

## 2020-11-29 NOTE — ED Notes (Signed)
Pt desatting while sleeping - placed on [place] while pt sleeping

## 2020-11-30 LAB — CBC
HCT: 29.7 % — ABNORMAL LOW (ref 36.0–46.0)
Hemoglobin: 9.8 g/dL — ABNORMAL LOW (ref 12.0–15.0)
MCH: 28 pg (ref 26.0–34.0)
MCHC: 33 g/dL (ref 30.0–36.0)
MCV: 84.9 fL (ref 80.0–100.0)
Platelets: 307 10*3/uL (ref 150–400)
RBC: 3.5 MIL/uL — ABNORMAL LOW (ref 3.87–5.11)
RDW: 13.1 % (ref 11.5–15.5)
WBC: 4.5 10*3/uL (ref 4.0–10.5)
nRBC: 0 % (ref 0.0–0.2)

## 2020-11-30 LAB — FOLLICLE STIMULATING HORMONE: FSH: 28.1 m[IU]/mL

## 2020-11-30 LAB — BASIC METABOLIC PANEL
Anion gap: 9 (ref 5–15)
BUN: 13 mg/dL (ref 6–20)
CO2: 27 mmol/L (ref 22–32)
Calcium: 8.6 mg/dL — ABNORMAL LOW (ref 8.9–10.3)
Chloride: 101 mmol/L (ref 98–111)
Creatinine, Ser: 0.5 mg/dL (ref 0.44–1.00)
GFR, Estimated: >60 mL/min (ref >60)
Glucose, Bld: 105 mg/dL — ABNORMAL HIGH (ref 70–99)
Potassium: 3.5 mmol/L (ref 3.5–5.1)
Sodium: 137 mmol/L (ref 135–145)

## 2020-11-30 LAB — LUTEINIZING HORMONE: LH: 27 m[IU]/mL

## 2020-11-30 LAB — GLUCOSE, CAPILLARY: Glucose-Capillary: 107 mg/dL — ABNORMAL HIGH (ref 70–99)

## 2020-11-30 MED ORDER — AMOXICILLIN-POT CLAVULANATE 875-125 MG PO TABS
1.0000 | ORAL_TABLET | Freq: Two times a day (BID) | ORAL | Status: DC
  Administered 2020-11-30 – 2020-12-01 (×3): 1 via ORAL
  Filled 2020-11-30 (×3): qty 1

## 2020-11-30 MED ORDER — DIPHENHYDRAMINE HCL 50 MG/ML IJ SOLN: 12.5000 mg | Freq: Four times a day (QID) | INTRAMUSCULAR | Status: DC | PRN

## 2020-11-30 NOTE — Progress Notes (Signed)
NEW ADMISSION NOTE New Admission Note:   Arrival Method: Stetcher Mental Orientation: A&O x4 Telemetry: None Assessment: Completed Skin: Pt. Skin inspected with Suzi Roots. Intact IV: 22G Left hand Pain: 0/10 Tubes: none present Safety Measures: Safety Fall Prevention Plan has been given, discussed and signed Admission: Completed [facility] Orientation: Patient has been orientated to the room, unit and staff. Family: none present   Orders have been reviewed and implemented. Will continue to monitor the patient. Call light has been placed within reach and bed alarm has been activated.

## 2020-11-30 NOTE — Progress Notes (Signed)
Subjective/Chief Complaint: RLQ pain improving. Denies fever, chills, nausea, or vomiting. Having flatus. States she is hungry. Asking how long she will be in hospital.  WBC normal - 4.5 Afebrile  Objective: Vital signs in last 24 hours: Temp:  [97.4 F (36.3 C)-98.6 F (37 C)] 97.7 F (36.5 C) (10/13 0933) Pulse Rate:  [70-79] 75 (10/13 0933) Resp:  [16-18] 17 (10/13 0933) BP: (125-151)/(47-74) 151/66 (10/13 0933) SpO2:  [96 %-100 %] 99 % (10/13 0933) Weight:  [125.2 kg] 125.2 kg (10/12 1920)    Intake/Output from previous day: 10/12 0701 - 10/13 0700 In: 2048.3 [I.V.:1750.2; IV Piggyback:298.2] Out: -  Intake/Output this shift: No intake/output data recorded.  General appearance: alert, cooperative, and no distress - ambulating in her room GI: morbidly obese with massive ventral hernia with loss of domain; mild RLQ tenderness (probably over the hernia sac) to deep palpation, no generalized peritonitis  Lab Results:  Recent Labs    11/29/20 0402 11/30/20 0708  WBC 6.8 4.5  HGB 10.0* 9.8*  HCT 31.6* 29.7*  PLT 301 307   BMET Recent Labs    11/29/20 0402 11/30/20 0708  NA 136 137  K 3.3* 3.5  CL 102 101  CO2 23 27  GLUCOSE 135* 105*  BUN 24* 13  CREATININE 0.77 0.50  CALCIUM 8.6* 8.6*   PT/INR No results for input(s): LABPROT, INR in the last 72 hours. ABG No results for input(s): PHART, HCO3 in the last 72 hours.  Invalid input(s): PCO2, PO2  Studies/Results: CT Abdomen Pelvis W Contrast  Result Date: 11/28/2020 CLINICAL DATA:  Right lower quadrant pain EXAM: CT ABDOMEN AND PELVIS WITH CONTRAST TECHNIQUE: Multidetector CT imaging of the abdomen and pelvis was performed using the standard protocol following bolus administration of intravenous contrast. CONTRAST:  OMNIPAQUE IOHEXOL 300 MG/ML  SOLN COMPARISON:  CT abdomen and pelvis dated January 26, 2016 FINDINGS: Lower chest: Mild opacities of the lower left lung, likely atelectasis. No acute  abnormality. Hepatobiliary: No focal liver abnormality is seen. No gallstones, gallbladder wall thickening, or biliary dilatation. Pancreas: Unremarkable. No pancreatic ductal dilatation or surrounding inflammatory changes. Spleen: Normal in size without focal abnormality. Adrenals/Urinary Tract: Adrenal glands are unremarkable. Kidneys are normal, without renal calculi, focal lesion, or hydronephrosis. Bladder is decompressed. Stomach/Bowel: Large ventral abdominal wall hernia containing a portion of the stomach and most of the large and small bowel. Appendix is dilated, measuring up to 1.1 cm with adjacent inflammatory change. Wall thickening of the cecum, likely reactive. No organized fluid collections or evidence extraluminal gas no evidence of bowel obstruction. Vascular/Lymphatic: Mild atherosclerotic disease of the abdominal aorta. No enlarged abdominal or pelvic lymph nodes. Reproductive: Uterus and bilateral adnexa are unremarkable. Other: Large ventral abdominal wall hernia, hernia neck measures 13.3 cm. No abdominopelvic ascites. Musculoskeletal: No acute or significant osseous findings. IMPRESSION: Findings compatible with acute uncomplicated appendicitis. Large ventral abdominal wall hernia containing a portion of the stomach, most of the large and small bowel, and the appendix. Aortic Atherosclerosis (ICD10-I70.0). These results will be called to the ordering clinician or representative by the Radiologist Assistant, and communication documented in the PACS or [employer]. Electronically Signed   By: Allegra Lai M.D.   On: 11/28/2020 13:45    Anti-infectives: Anti-infectives (From admission, onward)    Start     Dose/Rate Route Frequency Ordered Stop   11/29/20 1900  cefTRIAXone (ROCEPHIN) 2 g in sodium chloride 0.9 % 100 mL IVPB       See  Hyperspace for full Linked Orders Report.   2 g 200 mL/hr over 30 Minutes Intravenous Every 24 hours 11/28/20 2233 12/06/20 1859   11/29/20 0800   metroNIDAZOLE (FLAGYL) IVPB 500 mg       See Hyperspace for full Linked Orders Report.   500 mg 100 mL/hr over 60 Minutes Intravenous Every 12 hours 11/28/20 2233 12/06/20 0759   11/28/20 1930  cefTRIAXone (ROCEPHIN) 2 g in sodium chloride 0.9 % 100 mL IVPB       See Hyperspace for full Linked Orders Report.   2 g 200 mL/hr over 30 Minutes Intravenous  Once 11/28/20 1922 11/28/20 2036   11/28/20 1930  metroNIDAZOLE (FLAGYL) IVPB 500 mg       See Hyperspace for full Linked Orders Report.   500 mg 100 mL/hr over 60 Minutes Intravenous  Once 11/28/20 1922 11/28/20 2213       Assessment/Plan: Acute appendicitis - uncomplicated but in the setting of below hernia. - There is no good option for a laparoscopic approach.  Open surgery would involve just cutting into the hernia sac to find and remove the appendix.  There would only be hernia sac and skin to close over the bowel in the hernia.  If there were any wound problems, then she would eviscerate.   - continue medical management with IV abx, clinically improving  - start CLD   FEN: CLD, decrease IVF to 54mL/hr ID: Rocephin/Flagyl 10/12 >> VTE: SCD's, Lovenox  Foley: none   Morbid obesity Long-standing ventral hernia with loss of domain - appendix seems to be in the hernia sac DM2 HTN Hypercholesterolemia    Hosie Spangle, [facility]  General Surgery   11/30/2020 9:34 AM

## 2020-12-01 LAB — CBC
HCT: 33.7 % — ABNORMAL LOW (ref 36.0–46.0)
Hemoglobin: 10.7 g/dL — ABNORMAL LOW (ref 12.0–15.0)
MCH: 26.7 pg (ref 26.0–34.0)
MCHC: 31.8 g/dL (ref 30.0–36.0)
MCV: 84 fL (ref 80.0–100.0)
Platelets: 395 10*3/uL (ref 150–400)
RBC: 4.01 MIL/uL (ref 3.87–5.11)
RDW: 12.9 % (ref 11.5–15.5)
WBC: 5.8 10*3/uL (ref 4.0–10.5)
nRBC: 0 % (ref 0.0–0.2)

## 2020-12-01 LAB — POCT I-STAT CREATININE: Creatinine, Ser: 1.2 mg/dL — ABNORMAL HIGH (ref 0.44–1.00)

## 2020-12-01 MED ORDER — AMOXICILLIN-POT CLAVULANATE 875-125 MG PO TABS: 1.0000 | ORAL_TABLET | Freq: Two times a day (BID) | ORAL | 0 refills | Status: AC

## 2020-12-01 MED ORDER — ACETAMINOPHEN 500 MG PO TABS: 500.0000 mg | ORAL_TABLET | Freq: Four times a day (QID) | ORAL | Status: DC | PRN

## 2020-12-01 MED ORDER — DOCUSATE SODIUM 100 MG PO CAPS: 100.0000 mg | ORAL_CAPSULE | Freq: Every day | ORAL | Status: DC | PRN

## 2020-12-01 NOTE — Discharge Summary (Signed)
[place] [facility] Discharge Summary   Patient ID: Gabriela Watkins MRN: [id number] DOB/AGE: 53-14-1967 53 y.o.  Admit date: 11/28/2020 Discharge date: 12/01/2020  Admitting Diagnosis: Acute appendicitis Complex ventral hernia  Morbid obesity   Discharge Diagnosis Acute appendicitis, improving  Complex ventral hernia  Morbid obesity   Consultants None   Imaging: No results found.  Procedures None  Hospital Course:  Patient is a morbidly obese 55 year old female who presented to [facility] with abdominal pain and chronic complex ventral hernia.  Workup showed acute appendicitis with appendix contained at least partially within complex hernia.  Patient was admitted and treated conservatively with bowel rest and antibiotics. She improved with this. Patient has been seen at [facility] previously by general surgery for consideration of hernia repair. She would like to go back there to consider hernia repair again and understands that she will need to lose weight prior to this.  On 12/01/20, the patient was voiding well, tolerating diet, ambulating well, pain well controlled, vital signs stable, and felt stable for discharge home.  Patient will follow up in our office in 2 weeks and knows to call with questions or concerns. She will call to schedule an appointment to discuss hernia repair with her original MD as well.   Physical Exam: General appearance: alert, cooperative, and no distress - ambulating in her room GI: morbidly obese with massive ventral hernia with loss of domain; no RLQ tenderness, no generalized peritonitis    Allergies as of 12/01/2020   No Active Allergies      Medication List     TAKE these medications    Accu-Chek Guide test strip Generic drug: glucose blood Test BS up to 4 times daily Dx E11.9   acetaminophen 500 MG tablet Commonly known as: TYLENOL Take 1 tablet (500 mg total) by mouth every 6 (six) hours as needed for mild pain or fever.    amoxicillin-clavulanate 875-125 MG tablet Commonly known as: AUGMENTIN Take 1 tablet by mouth every 12 (twelve) hours for 12 days.   atorvastatin 80 MG tablet Commonly known as: LIPITOR TAKE 1 TABLET BY MOUTH EVERY DAY (NEEDS TO BE SEEN BEFORE NEXT REFILL) What changed:  how much to take how to take this when to take this additional instructions   blood glucose meter kit and supplies Kit Dispense based on patient and insurance preference. Use up to four times daily as directed. (FOR ICD-9 250.00, 250.01).   docusate sodium 100 MG capsule Commonly known as: COLACE Take 1 capsule (100 mg total) by mouth daily as needed for mild constipation.   ibuprofen 200 MG tablet Commonly known as: ADVIL Take 400-600 mg by mouth every 6 (six) hours as needed for mild pain.   Lancets 30G Misc Check blood glucose BID and prn   lisinopril 20 MG tablet Commonly known as: ZESTRIL TAKE 1 TABLET BY MOUTH EVERY DAY   metFORMIN 500 MG 24 hr tablet Commonly known as: GLUCOPHAGE-XR TAKE 1 TABLET BY MOUTH EVERY DAY WITH BREAKFAST What changed:  how much to take how to take this when to take this additional instructions          Follow-up Information     Donnie Mesa, MD. Go on 12/18/2020.   Specialty: General Surgery Why: 8:40 AM. Please arrive 30 min prior to appointment time. Contact information: [place] [address] [phone]         Elaina Hoops, MD. Schedule an appointment as soon as possible for  a visit.   Specialty: Surgery Why: Schedule an appointment to discuss hernia repair Contact information: [place] [address] [phone]                 Signed: Norm Parcel , [facility] 12/01/2020, 9:44 AM Please see Amion for pager number during day hours 7:00am-4:30pm

## 2020-12-01 NOTE — Plan of Care (Signed)
  Problem: Education: Goal: Knowledge of General Education information will improve Description: Including pain rating scale, medication(s)/side effects and non-pharmacologic comfort measures Outcome: Progressing   Problem: Health Behavior/Discharge Planning: Goal: Ability to manage health-related needs will improve Outcome: Progressing   Problem: Activity: Goal: Risk for activity intolerance will decrease Outcome: Progressing   

## 2020-12-01 NOTE — Progress Notes (Signed)
Pt has been d/c and answered questions pt had... pt verb understanding and son was at bedside.

## 2020-12-01 NOTE — Discharge Instructions (Signed)
Debe programar un seguimiento con el mdico que estaba viendo en [facility] para hablar sobre la reparacin de la hernia. Si tiene algn sntoma de empeoramiento de la apendicitis, como empeoramiento del dolor o la fiebre, Soil scientist ir a [facility] ED en caso de que necesite [place].

## 2020-12-01 NOTE — TOC Transition Note (Signed)
Transition of Care [facility]) - CM/SW Discharge Note   Patient Details  Name: Gabriela Watkins MRN: [id number] Date of Birth: 1965/03/16  [facility]) CM/SW Contact:  Tom-Johnson, Hershal Coria, RN Phone Number: 12/01/2020, 3:28 PM   Clinical Narrative:    CM spoke with patient at bedside and [facility], interpreter at bedside. Presented with abdominal pain and chronic complex ventral hernia. Workup showed acute appendicitis. Patient states she lives at home with her son and only child. Father alive and has siblings whom are all supportive with care. Employed by [employer]. Independent with care prior to hospitalization and drives self. Does not have any DME's and does not need one. Has a PCP and Medically Insured by [facility]. Denies any needs No recommendations noted. Son to transport at discharge. No further TOC needs noted.    Final next level of care: Home/Self Care Barriers to Discharge: No Barriers Identified   Patient Goals and CMS Choice Patient states their goals for this hospitalization and ongoing recovery are:: To go home   Choice offered to / list presented to : NA  Discharge Placement                       Discharge Plan and Services                DME Arranged: N/A DME Agency: NA         HH Agency: NA        Social Determinants of Health (SDOH) Interventions     Readmission Risk Interventions No flowsheet data found.

## 2021-02-17 ENCOUNTER — Other Ambulatory Visit: Payer: Self-pay | Admitting: Family

## 2021-02-17 DIAGNOSIS — E1159 Type 2 diabetes mellitus with other circulatory complications: Secondary | ICD-10-CM

## 2021-02-17 DIAGNOSIS — I152 Hypertension secondary to endocrine disorders: Secondary | ICD-10-CM

## 2021-05-24 ENCOUNTER — Other Ambulatory Visit: Payer: Self-pay | Admitting: Family

## 2021-05-24 DIAGNOSIS — I152 Hypertension secondary to endocrine disorders: Secondary | ICD-10-CM

## 2021-05-28 ENCOUNTER — Encounter: Payer: Self-pay | Admitting: Family Medicine

## 2021-05-28 ENCOUNTER — Ambulatory Visit (INDEPENDENT_AMBULATORY_CARE_PROVIDER_SITE_OTHER): Payer: Managed Care, Other (non HMO) | Admitting: Family Medicine

## 2021-05-28 VITALS — BP 144/88 | HR 67 | Temp 97.8°F | Ht 62.0 in | Wt 239.0 lb

## 2021-05-28 DIAGNOSIS — K644 Residual hemorrhoidal skin tags: Secondary | ICD-10-CM | POA: Diagnosis not present

## 2021-05-28 DIAGNOSIS — K5900 Constipation, unspecified: Secondary | ICD-10-CM

## 2021-05-28 MED ORDER — HYDROCORTISONE (PERIANAL) 2.5 % EX CREA: 1.0000 "application " | TOPICAL_CREAM | Freq: Two times a day (BID) | CUTANEOUS | 0 refills | Status: DC

## 2021-05-28 MED ORDER — POLYETHYLENE GLYCOL 3350 17 GM/SCOOP PO POWD: 17.0000 g | Freq: Two times a day (BID) | ORAL | 1 refills | Status: DC | PRN

## 2021-05-28 NOTE — Patient Instructions (Signed)

## 2021-05-28 NOTE — Progress Notes (Signed)
? ?Acute Office Visit ? ?Subjective:  ? ? Patient ID: Gabriela Watkins, female    DOB: 06-01-1965, 55 y.o.   MRN: [id number] ? ?Chief Complaint  ?Patient presents with  ? Constipation  ?  X 1 week. Patient has had some BM's but it is hard to go and a little.  Going 5-6 times per day but a little.   ? ? ?HPI ?Here with son today. Patient is in today for constipation for 1 week. She had has a daily BM but has to strain and this results in only a small amount of stool. She denies fever, nausea, vomiting, abdominal pain, blood in stool, or diarrhea. She has tried a stool softener once a few days ago. She also took some aleve this morning. She also has been dealing with hemorrhoids and has been using an OTC cream with some relief. She reports discomfort with BMs. Denies bleeding.  ? ?Past Medical History:  ?Diagnosis Date  ? Anemia   ? High cholesterol   ? Hypertension   ? Small bowel obstruction ([place])   ? hospital admission 08/24/2012  ? Type II diabetes mellitus ([place])   ? ? ?Past Surgical History:  ?Procedure Laterality Date  ? HERNIA REPAIR  ~ 01/2009; 04/2009  ? "stomach" (08/25/2012)  ? LEFT OOPHORECTOMY  ~ 2004  ? "tumor" (08/25/2012)  ? RIGHT OOPHORECTOMY  ~ 2007  ? "tumor; only took off a portion of the ovary" (08/25/2012)  ? ? ?Family History  ?Problem Relation Age of Onset  ? Diabetes Mother   ? ? ?Social History  ? ?Socioeconomic History  ? Marital status: Widowed  ?  Spouse name: Not on file  ? Number of children: 1  ? Years of education: 47  ? Highest education level: Not on file  ?Occupational History  ? Not on file  ?Tobacco Use  ? Smoking status: Never  ? Smokeless tobacco: Never  ?Vaping Use  ? Vaping Use: Never used  ?Substance and Sexual Activity  ? Alcohol use: No  ? Drug use: No  ? Sexual activity: Never  ?Other Topics Concern  ? Not on file  ?Social History Narrative  ? Not on file  ? ?Social Determinants of Health  ? ?Financial Resource Strain: Not on file  ?Food Insecurity: Not on file  ?Transportation Needs:  Not on file  ?Physical Activity: Not on file  ?Stress: Not on file  ?Social Connections: Not on file  ?Intimate Partner Violence: Not on file  ? ? ?Outpatient Medications Prior to Visit  ?Medication Sig Dispense Refill  ? acetaminophen (TYLENOL) 500 MG tablet Take 1 tablet (500 mg total) by mouth every 6 (six) hours as needed for mild pain or fever.    ? atorvastatin (LIPITOR) 80 MG tablet TAKE 1 TABLET BY MOUTH EVERY DAY (NEEDS TO BE SEEN BEFORE NEXT REFILL) (Patient taking differently: Take 80 mg by mouth daily.) 30 tablet 0  ? blood glucose meter kit and supplies KIT Dispense based on patient and insurance preference. Use up to four times daily as directed. (FOR ICD-9 250.00, 250.01). 1 each 0  ? docusate sodium (COLACE) 100 MG capsule Take 1 capsule (100 mg total) by mouth daily as needed for mild constipation.    ? glucose blood (ACCU-CHEK GUIDE) test strip Test BS up to 4 times daily Dx E11.9 400 each 3  ? ibuprofen (ADVIL) 200 MG tablet Take 400-600 mg by mouth every 6 (six) hours as needed for mild pain.    ?  Lancets 30G MISC Check blood glucose BID and prn 100 each 6  ? lisinopril (ZESTRIL) 20 MG tablet TAKE 1 TABLET BY MOUTH EVERY DAY 90 tablet 0  ? metFORMIN (GLUCOPHAGE-XR) 500 MG 24 hr tablet TAKE 1 TABLET BY MOUTH EVERY DAY WITH BREAKFAST (Patient taking differently: Take 500 mg by mouth daily with breakfast.) 90 tablet 0  ? ?No facility-administered medications prior to visit.  ? ? ?No Active Allergies ? ?Review of Systems ?As per HPI.  ?   ?Objective:  ?  ?Physical Exam ?Vitals and nursing note reviewed.  ?Constitutional:   ?   General: She is not in acute distress. ?   Appearance: She is not ill-appearing, toxic-appearing or diaphoretic.  ?Cardiovascular:  ?   Rate and Rhythm: Normal rate.  ?Pulmonary:  ?   Effort: Pulmonary effort is normal. No respiratory distress.  ?Abdominal:  ?   General: Bowel sounds are normal. There is no distension.  ?   Palpations: Abdomen is soft.  ?   Tenderness: There is  no abdominal tenderness. There is no guarding or rebound.  ?Musculoskeletal:  ?   Right lower leg: No edema.  ?   Left lower leg: No edema.  ?Skin: ?   General: Skin is warm and dry.  ?Neurological:  ?   General: No focal deficit present.  ?   Mental Status: She is alert and oriented to person, place, and time.  ?Psychiatric:     ?   Mood and Affect: Mood normal.     ?   Behavior: Behavior normal.  ? ? ?BP (!) 144/88   Pulse 67   Temp 97.8 ?F (36.6 ?C) (Temporal)   Ht $R'5\' 2"'QC$  (1.575 m)   Wt 239 lb (108.4 kg)   BMI 43.71 kg/m?  ?Wt Readings from Last 3 Encounters:  ?05/28/21 239 lb (108.4 kg)  ?12/01/20 269 lb 6.4 oz (122.2 kg)  ?11/28/20 275 lb (124.7 kg)  ? ? ?Health Maintenance Due  ?Topic Date Due  ? Fecal DNA (Cologuard)  Never done  ? Zoster Vaccines- Shingrix (1 of 2) Never done  ? OPHTHALMOLOGY EXAM  07/11/2018  ? COVID-19 Vaccine (3 - Booster for Pfizer series) 07/17/2019  ? PAP SMEAR-Modifier  10/18/2019  ? HEMOGLOBIN A1C  07/05/2020  ? FOOT EXAM  01/05/2021  ? MAMMOGRAM  02/15/2021  ? TETANUS/TDAP  05/08/2021  ? ? ?There are no preventive care reminders to display for this patient. ? ? ?Lab Results  ?Component Value Date  ? TSH 2.360 10/17/2016  ? ?Lab Results  ?Component Value Date  ? WBC 5.8 12/01/2020  ? HGB 10.7 (L) 12/01/2020  ? HCT 33.7 (L) 12/01/2020  ? MCV 84.0 12/01/2020  ? PLT 395 12/01/2020  ? ?Lab Results  ?Component Value Date  ? NA 137 11/30/2020  ? K 3.5 11/30/2020  ? CO2 27 11/30/2020  ? GLUCOSE 105 (H) 11/30/2020  ? BUN 13 11/30/2020  ? CREATININE 0.50 11/30/2020  ? BILITOT 1.0 11/28/2020  ? ALKPHOS 132 (H) 11/28/2020  ? AST 12 (L) 11/28/2020  ? ALT 15 11/28/2020  ? PROT 6.5 11/28/2020  ? ALBUMIN 3.2 (L) 11/28/2020  ? CALCIUM 8.6 (L) 11/30/2020  ? ANIONGAP 9 11/30/2020  ? ?Lab Results  ?Component Value Date  ? CHOL 220 (H) 01/06/2020  ? ?Lab Results  ?Component Value Date  ? HDL 43 01/06/2020  ? ?Lab Results  ?Component Value Date  ? LDLCALC 146 (H) 01/06/2020  ? ?Lab Results   ?Component Value  Date  ? TRIG 174 (H) 01/06/2020  ? ?Lab Results  ?Component Value Date  ? CHOLHDL 5.1 (H) 01/06/2020  ? ?Lab Results  ?Component Value Date  ? HGBA1C 6.7 01/06/2020  ? ? ?   ?Assessment & Plan:  ? ?Gabriela Watkins was seen today for constipation. ? ?Diagnoses and all orders for this visit: ? ?Constipation, unspecified constipation type ?Miraxlax as below. Increase hydration, high fiber foods in diet.  ?-     polyethylene glycol powder (GLYCOLAX/MIRALAX) 17 GM/SCOOP powder; Take 17 g by mouth 2 (two) times daily as needed. ? ?External hemorrhoids ?Discussed tucks pads, sitz baths, preventions of constipation. ?-     hydrocortisone (ANUSOL-HC) 2.5 % rectal cream; Place 1 application. rectally 2 (two) times daily. ? ?Return to office for new or worsening symptoms, or if symptoms persist.  ? ?The patient indicates understanding of these issues and agrees with the plan. ? ?Gwenlyn Perking, FNP ? ?

## 2021-05-30 ENCOUNTER — Telehealth: Payer: Self-pay | Admitting: Family

## 2021-05-30 NOTE — Telephone Encounter (Signed)
Interpreter reached out and couldn't get a hold on pt says it kept giving her a busy signal ?

## 2021-05-30 NOTE — Telephone Encounter (Signed)
Pt given hydrocortisone cream but is getting no relief, is there alternative ?

## 2021-05-30 NOTE — Telephone Encounter (Signed)
There is really no alternative for hemorrhoid relief. I can send suppositories if she wants but they are the same medication just in suppository form. Sitz bathes, tucks pads, stool softener to reduce straining. ?

## 2021-05-30 NOTE — Telephone Encounter (Signed)
PT R/C 

## 2021-05-31 NOTE — Telephone Encounter (Signed)
Called and left message my self no return call will close encounter ?

## 2021-05-31 NOTE — Telephone Encounter (Signed)
Called using interpreter And they had to leave message   ?

## 2021-06-19 ENCOUNTER — Other Ambulatory Visit: Payer: Self-pay | Admitting: Family

## 2021-06-19 DIAGNOSIS — E1159 Type 2 diabetes mellitus with other circulatory complications: Secondary | ICD-10-CM

## 2021-07-23 ENCOUNTER — Emergency Department (HOSPITAL_COMMUNITY): Payer: Managed Care, Other (non HMO)

## 2021-07-23 ENCOUNTER — Inpatient Hospital Stay (HOSPITAL_COMMUNITY)
Admission: EM | Admit: 2021-07-23 | Discharge: 2021-07-26 | DRG: 378 | Disposition: A | Discharge status: Home / Self Care | Payer: Managed Care, Other (non HMO) | Attending: Family Medicine | Admitting: Family Medicine

## 2021-07-23 ENCOUNTER — Encounter (HOSPITAL_COMMUNITY): Payer: Self-pay | Admitting: Emergency Medicine

## 2021-07-23 DIAGNOSIS — D539 Nutritional anemia, unspecified: Secondary | ICD-10-CM | POA: Diagnosis present

## 2021-07-23 DIAGNOSIS — E1169 Type 2 diabetes mellitus with other specified complication: Secondary | ICD-10-CM | POA: Diagnosis present

## 2021-07-23 DIAGNOSIS — Z7984 Long term (current) use of oral hypoglycemic drugs: Secondary | ICD-10-CM

## 2021-07-23 DIAGNOSIS — K921 Melena: Secondary | ICD-10-CM | POA: Diagnosis not present

## 2021-07-23 DIAGNOSIS — D638 Anemia in other chronic diseases classified elsewhere: Secondary | ICD-10-CM | POA: Diagnosis present

## 2021-07-23 DIAGNOSIS — D649 Anemia, unspecified: Secondary | ICD-10-CM | POA: Diagnosis present

## 2021-07-23 DIAGNOSIS — E119 Type 2 diabetes mellitus without complications: Secondary | ICD-10-CM

## 2021-07-23 DIAGNOSIS — I152 Hypertension secondary to endocrine disorders: Secondary | ICD-10-CM | POA: Diagnosis present

## 2021-07-23 DIAGNOSIS — E869 Volume depletion, unspecified: Secondary | ICD-10-CM | POA: Diagnosis present

## 2021-07-23 DIAGNOSIS — K5900 Constipation, unspecified: Secondary | ICD-10-CM | POA: Diagnosis present

## 2021-07-23 DIAGNOSIS — E1159 Type 2 diabetes mellitus with other circulatory complications: Secondary | ICD-10-CM | POA: Diagnosis not present

## 2021-07-23 DIAGNOSIS — I10 Essential (primary) hypertension: Secondary | ICD-10-CM | POA: Diagnosis present

## 2021-07-23 DIAGNOSIS — R55 Syncope and collapse: Principal | ICD-10-CM

## 2021-07-23 DIAGNOSIS — Z833 Family history of diabetes mellitus: Secondary | ICD-10-CM

## 2021-07-23 DIAGNOSIS — E7800 Pure hypercholesterolemia, unspecified: Secondary | ICD-10-CM | POA: Diagnosis present

## 2021-07-23 DIAGNOSIS — D62 Acute posthemorrhagic anemia: Secondary | ICD-10-CM | POA: Diagnosis present

## 2021-07-23 DIAGNOSIS — Z90722 Acquired absence of ovaries, bilateral: Secondary | ICD-10-CM

## 2021-07-23 DIAGNOSIS — Z6841 Body Mass Index (BMI) 40.0 and over, adult: Secondary | ICD-10-CM

## 2021-07-23 DIAGNOSIS — Z79899 Other long term (current) drug therapy: Secondary | ICD-10-CM

## 2021-07-23 LAB — URINALYSIS, ROUTINE W REFLEX MICROSCOPIC
Bilirubin Urine: NEGATIVE
Glucose, UA: NEGATIVE mg/dL
Hgb urine dipstick: NEGATIVE
Ketones, ur: NEGATIVE mg/dL
Nitrite: NEGATIVE
Protein, ur: 30 mg/dL — AB
Specific Gravity, Urine: 1.023 (ref 1.005–1.030)
pH: 5 (ref 5.0–8.0)

## 2021-07-23 LAB — BASIC METABOLIC PANEL
Anion gap: 6 (ref 5–15)
BUN: 22 mg/dL — ABNORMAL HIGH (ref 6–20)
CO2: 22 mmol/L (ref 22–32)
Calcium: 8.9 mg/dL (ref 8.9–10.3)
Chloride: 111 mmol/L (ref 98–111)
Creatinine, Ser: 0.64 mg/dL (ref 0.44–1.00)
GFR, Estimated: >60 mL/min (ref >60)
Glucose, Bld: 150 mg/dL — ABNORMAL HIGH (ref 70–99)
Potassium: 3.7 mmol/L (ref 3.5–5.1)
Sodium: 139 mmol/L (ref 135–145)

## 2021-07-23 LAB — CBC
HCT: 23.3 % — ABNORMAL LOW (ref 36.0–46.0)
Hemoglobin: 6.6 g/dL — CL (ref 12.0–15.0)
MCH: 18.2 pg — ABNORMAL LOW (ref 26.0–34.0)
MCHC: 28.3 g/dL — ABNORMAL LOW (ref 30.0–36.0)
MCV: 64.2 fL — ABNORMAL LOW (ref 80.0–100.0)
Platelets: 441 10*3/uL — ABNORMAL HIGH (ref 150–400)
RBC: 3.63 MIL/uL — ABNORMAL LOW (ref 3.87–5.11)
RDW: 19.1 % — ABNORMAL HIGH (ref 11.5–15.5)
WBC: 9.7 10*3/uL (ref 4.0–10.5)
nRBC: 0 % (ref 0.0–0.2)

## 2021-07-23 LAB — HEMOGLOBIN: Hemoglobin: 6 g/dL — CL (ref 12.0–15.0)

## 2021-07-23 LAB — POC OCCULT BLOOD, ED: Fecal Occult Bld: NEGATIVE

## 2021-07-23 LAB — CBG MONITORING, ED: Glucose-Capillary: 141 mg/dL — ABNORMAL HIGH (ref 70–99)

## 2021-07-23 LAB — PREPARE RBC (CROSSMATCH)

## 2021-07-23 LAB — GLUCOSE, CAPILLARY: Glucose-Capillary: 125 mg/dL — ABNORMAL HIGH (ref 70–99)

## 2021-07-23 MED ORDER — PANTOPRAZOLE SODIUM 40 MG IV SOLR
40.0000 mg | Freq: Two times a day (BID) | INTRAVENOUS | Status: DC
  Administered 2021-07-23 – 2021-07-25 (×5): 40 mg via INTRAVENOUS
  Filled 2021-07-23 (×6): qty 10

## 2021-07-23 MED ORDER — SODIUM CHLORIDE 0.9 % IV SOLN
10.0000 mL/h | Freq: Once | INTRAVENOUS | Status: AC
  Administered 2021-07-23: 10 mL/h via INTRAVENOUS

## 2021-07-23 NOTE — Assessment & Plan Note (Signed)
Pt on phentermine and Topamax for weight lost. Can continue pending med rec.

## 2021-07-23 NOTE — H&P (Signed)
History and Physical    Patient: Gabriela Watkins [id number]:[id number] DOB: Feb 01, 1966 DOA: 07/23/2021 DOS: the patient was seen and examined on 07/23/2021 PCP: Sharion Balloon, FNP  Patient coming from: Home  Chief Complaint:  Chief Complaint  Patient presents with   Near Syncope   HPI: Gabriela Watkins is a 56 y.o. female with medical history significant of hypertension, type 2 diabetes, iron deficiency anemia, morbid obesity who presents with concerns of a pre-syncopal episode.   Patient is mostly Spanish-speaking and requested nurse tech at bedside to help translate for her. She was standing at work and felt very diaphoretic and dizziness like she was about to pass out. For the 2 days also had a headache which is rare for her. Resolved with Aleve.  Also has issues with constipation and has noticed dark stool usually with her first bowel movement.  Has noticed more dark stool in the past several days.  Denies any abdominal pain.  Never had a colonoscopy.  Also reports using naproxen several times weekly and had it about 3 times this weekend.  She reports her last menstrual cycle was August of last year.  She has history of iron deficiency anemia requiring IV transfusion but that was about 10 to 20 years ago.  In the ED, she was afebrile and normotensive on room air.  Hemoglobin with anemia of 6.6 down from 10.7 to 7 months ago.  FOBT is negative.  Creatinine is normal at 0.64 with BUN of 22.  CBG of 150.  CT head was negative.  2 units of PRBC was ordered in the ED.  Hospitalist then called for admission. Review of Systems: As mentioned in the history of present illness. All other systems reviewed and are negative. Past Medical History:  Diagnosis Date   Anemia    High cholesterol    Hypertension    Small bowel obstruction [place])    hospital admission 08/24/2012   Type II diabetes mellitus [facility])    Past Surgical History:  Procedure Laterality Date   HERNIA REPAIR  ~ 01/2009; 04/2009   "stomach"  (08/25/2012)   LEFT OOPHORECTOMY  ~ 2004   "tumor" (08/25/2012)   RIGHT OOPHORECTOMY  ~ 2007   "tumor; only took off a portion of the ovary" (08/25/2012)   Social History:  reports that she has never smoked. She has never used smokeless tobacco. She reports that she does not drink alcohol and does not use drugs.  No Known Allergies  Family History  Problem Relation Age of Onset   Diabetes Mother     Prior to Admission medications   Medication Sig Start Date End Date Taking? Authorizing Provider  acetaminophen (TYLENOL) 500 MG tablet Take 1 tablet (500 mg total) by mouth every 6 (six) hours as needed for mild pain or fever. 12/01/20   Norm Parcel, PA-C  atorvastatin (LIPITOR) 80 MG tablet TAKE 1 TABLET BY MOUTH EVERY DAY (NEEDS TO BE SEEN BEFORE NEXT REFILL) Patient taking differently: Take 80 mg by mouth daily. 07/12/20   Sharion Balloon, FNP  blood glucose meter kit and supplies KIT Dispense based on patient and insurance preference. Use up to four times daily as directed. (FOR ICD-9 250.00, 250.01). 06/01/15   Evelina Dun A, FNP  docusate sodium (COLACE) 100 MG capsule Take 1 capsule (100 mg total) by mouth daily as needed for mild constipation. 12/01/20   Norm Parcel, PA-C  glucose blood (ACCU-CHEK GUIDE) test strip Test BS up to 4 times  daily Dx E11.9 11/21/20   Sharion Balloon, FNP  hydrocortisone (ANUSOL-HC) 2.5 % rectal cream Place 1 application. rectally 2 (two) times daily. 05/28/21   Gwenlyn Perking, FNP  ibuprofen (ADVIL) 200 MG tablet Take 400-600 mg by mouth every 6 (six) hours as needed for mild pain.    [provider]  Lancets 30G MISC Check blood glucose BID and prn 03/02/15   Evelina Dun A, FNP  lisinopril (ZESTRIL) 20 MG tablet TAKE 1 TABLET BY MOUTH EVERY DAY 02/20/21   Evelina Dun A, FNP  metFORMIN (GLUCOPHAGE-XR) 500 MG 24 hr tablet TAKE 1 TABLET BY MOUTH EVERY DAY WITH BREAKFAST Patient taking differently: Take 500 mg by mouth daily with  breakfast. 07/05/20   Evelina Dun A, FNP  polyethylene glycol powder (GLYCOLAX/MIRALAX) 17 GM/SCOOP powder Take 17 g by mouth 2 (two) times daily as needed. 05/28/21   Gwenlyn Perking, FNP    Physical Exam: Vitals:   07/23/21 1515 07/23/21 1600 07/23/21 1730 07/23/21 1755  BP: (!) 103/46 (!) 117/39 (!) 109/47 130/60  Pulse: 72 75 70 79  Resp: $Remo'19 14 13 17  'QnNQL$ Temp:  98.2 F (36.8 C) 98.3 F (36.8 C) 97.9 F (36.6 C)  TempSrc:  Oral Oral Oral  SpO2: 100% 100% 99% 100%  Constitutional: NAD, calm, comfortable, obese female laying upright in bed Eyes: lids and conjunctivae normal ENMT: Mucous membranes are moist.   Neck: normal, supple,  Respiratory: clear to auscultation bilaterally, no wheezing, no crackles. Normal respiratory effort. No accessory muscle use.  Cardiovascular: Regular rate and rhythm, no murmurs / rubs / gallops. No extremity edema.  Abdomen: soft, non-distended, no tenderness, no masses palpated. Bowel sounds positive.  Musculoskeletal: no clubbing / cyanosis. No joint deformity upper and lower extremities. Good ROM, no contractures. Normal muscle tone.  Skin: no rashes, lesions, ulcers. No induration Neurologic: CN 2-12 grossly intact. Strength 5/5 in all 4.  Psychiatric: Normal judgment and insight. Alert and oriented x 3. Normal mood. Data Reviewed:  See HPI  Assessment and Plan: * Symptomatic anemia Presented with hemoglobin of 6.6 down from 10.7.  FOBT is negative but does report seeing dark stools for the past several days and has been taking increased doses of NSAID.  Also she is postmenopausal and does not have menstrual cycle to account for her blood loss. -will keep on clear liquid diet and NPO midnight -start IV PPI q12hr  -consult [facility] to see in consultation -Transfuse 2u pRBC and follow H/H. Goal of Hgb >7.   Obesity, morbid ([place]) Pt on phentermine and Topamax for weight lost. Can continue pending med rec.   Hypertension associated with  diabetes ([place]) Hold on antihypertensives for now while she is volume depleted.   Diabetes mellitus ([place]) Hold on any SSI for now while she is on clear liquid diet and will be NPO.       Advance Care Planning:   Code Status: Full Code   Consults: GI  Family Communication: Discussed with son at bedside  Severity of Illness: The appropriate patient status for this patient is OBSERVATION. Observation status is judged to be reasonable and necessary in order to provide the required intensity of service to ensure the patient's safety. The patient's presenting symptoms, physical exam findings, and initial radiographic and laboratory data in the context of their medical condition is felt to place them at decreased risk for further clinical deterioration. Furthermore, it is anticipated that the patient will be medically stable for discharge from  the hospital within 2 midnights of admission.   Author: Orene Desanctis, DO 07/23/2021 6:21 PM  For on call review [email or web address].

## 2021-07-23 NOTE — ED Triage Notes (Signed)
[name] from work where she suddenly starting breathing quickly, sat down, and started staring and did not respond to coworkers for approximately two minutes. 18g saline lock in left AC from EMS. Patient is alert, oriented, and in no apparent distress at this time.  EMS Vitals 122/62 HR 81 RR 16 CBG 209 98% on room air

## 2021-07-23 NOTE — Assessment & Plan Note (Signed)
Hold on antihypertensives for now while she is volume depleted.

## 2021-07-23 NOTE — Assessment & Plan Note (Signed)
Presented with hemoglobin of 6.6 down from 10.7.  FOBT is negative but does report seeing dark stools for the past several days and has been taking increased doses of NSAID.  Also she is postmenopausal and does not have menstrual cycle to account for her blood loss. -will keep on clear liquid diet and NPO midnight -start IV PPI q12hr  -consult [facility] to see in consultation -Transfuse 2u pRBC and follow H/H. Goal of Hgb >7.

## 2021-07-23 NOTE — ED Provider Notes (Signed)
[facility] EMERGENCY DEPARTMENT Provider Note   CSN: [id number] Arrival date & time: 07/23/21  1013     History  Chief Complaint  Patient presents with   Near Syncope    Gabriela Watkins is a 56 y.o. female.  HPI  5 caveat secondary to language barrier this is a 56 year old female who presents today complaining of generalized weakness.  He states that she was in her normal state of health and began feeling lightheaded and weak this morning around 830.  She denies any chest pain, fever, chills, abdominal pain, or rectal bleeding.  He has not had similar symptoms in the past.  She does have a history of diabetes, does not report any hypoglycemia.  She was at work when this began and she did not respond to her coworkers for several minutes.  They report that she was staring and did not respond.  There is no report of seizure activity.  Home Medications Prior to Admission medications   Medication Sig Start Date End Date Taking? Authorizing Provider  acetaminophen (TYLENOL) 500 MG tablet Take 1 tablet (500 mg total) by mouth every 6 (six) hours as needed for mild pain or fever. 12/01/20   Norm Parcel, PA-C  atorvastatin (LIPITOR) 80 MG tablet TAKE 1 TABLET BY MOUTH EVERY DAY (NEEDS TO BE SEEN BEFORE NEXT REFILL) Patient taking differently: Take 80 mg by mouth daily. 07/12/20   Sharion Balloon, FNP  blood glucose meter kit and supplies KIT Dispense based on patient and insurance preference. Use up to four times daily as directed. (FOR ICD-9 250.00, 250.01). 06/01/15   Evelina Dun A, FNP  docusate sodium (COLACE) 100 MG capsule Take 1 capsule (100 mg total) by mouth daily as needed for mild constipation. 12/01/20   Norm Parcel, PA-C  glucose blood (ACCU-CHEK GUIDE) test strip Test BS up to 4 times daily Dx E11.9 11/21/20   Sharion Balloon, FNP  hydrocortisone (ANUSOL-HC) 2.5 % rectal cream Place 1 application. rectally 2 (two) times daily. 05/28/21   Gwenlyn Perking,  FNP  ibuprofen (ADVIL) 200 MG tablet Take 400-600 mg by mouth every 6 (six) hours as needed for mild pain.    [provider]  Lancets 30G MISC Check blood glucose BID and prn 03/02/15   Evelina Dun A, FNP  lisinopril (ZESTRIL) 20 MG tablet TAKE 1 TABLET BY MOUTH EVERY DAY 02/20/21   Evelina Dun A, FNP  metFORMIN (GLUCOPHAGE-XR) 500 MG 24 hr tablet TAKE 1 TABLET BY MOUTH EVERY DAY WITH BREAKFAST Patient taking differently: Take 500 mg by mouth daily with breakfast. 07/05/20   Evelina Dun A, FNP  polyethylene glycol powder (GLYCOLAX/MIRALAX) 17 GM/SCOOP powder Take 17 g by mouth 2 (two) times daily as needed. 05/28/21   Gwenlyn Perking, FNP      Allergies    Patient has no known allergies.    Review of Systems   Review of Systems  Physical Exam Updated Vital Signs BP (!) 103/46   Pulse 72   Temp 97.9 F (36.6 C) (Oral)   Resp 19   SpO2 100%  Physical Exam Vitals and nursing note reviewed.  Constitutional:      Appearance: She is obese.  HENT:     Head: Normocephalic.     Right Ear: External ear normal.     Left Ear: External ear normal.     Nose: Nose normal.     Mouth/Throat:     Pharynx: Oropharynx is clear.  Eyes:     Extraocular Movements: Extraocular movements intact.     Pupils: Pupils are equal, round, and reactive to light.  Cardiovascular:     Rate and Rhythm: Normal rate and regular rhythm.     Pulses: Normal pulses.     Heart sounds: Normal heart sounds.  Pulmonary:     Effort: Pulmonary effort is normal.     Breath sounds: Normal breath sounds.  Abdominal:     General: Bowel sounds are normal. There is no distension.     Palpations: Abdomen is soft.     Tenderness: There is no abdominal tenderness.  Musculoskeletal:        General: No swelling.     Cervical back: Normal range of motion.     Right lower leg: No edema.     Left lower leg: No edema.  Skin:    General: Skin is warm and dry.     Capillary Refill: Capillary refill takes less  than 2 seconds.  Neurological:     General: No focal deficit present.     Mental Status: She is alert.  Psychiatric:        Mood and Affect: Mood normal.    ED Results / Procedures / Treatments   Labs (all labs ordered are listed, but only abnormal results are displayed) Labs Reviewed  BASIC METABOLIC PANEL - Abnormal; Notable for the following components:      Result Value   Glucose, Bld 150 (*)    BUN 22 (*)    All other components within normal limits  CBC - Abnormal; Notable for the following components:   RBC 3.63 (*)    Hemoglobin 6.6 (*)    HCT 23.3 (*)    MCV 64.2 (*)    MCH 18.2 (*)    MCHC 28.3 (*)    RDW 19.1 (*)    Platelets 441 (*)    All other components within normal limits  URINALYSIS, ROUTINE W REFLEX MICROSCOPIC - Abnormal; Notable for the following components:   APPearance CLOUDY (*)    Protein, ur 30 (*)    Leukocytes,Ua MODERATE (*)    Bacteria, UA FEW (*)    All other components within normal limits  HEMOGLOBIN - Abnormal; Notable for the following components:   Hemoglobin 6.0 (*)    All other components within normal limits  CBG MONITORING, ED - Abnormal; Notable for the following components:   Glucose-Capillary 141 (*)    All other components within normal limits  POC OCCULT BLOOD, ED  TYPE AND SCREEN  PREPARE RBC (CROSSMATCH)    EKG EKG Interpretation  Date/Time:  Monday July 23 2021 10:32:53 EDT Ventricular Rate:  77 PR Interval:  162 QRS Duration: 88 QT Interval:  394 QTC Calculation: 445 R Axis:   40 Text Interpretation: Normal sinus rhythm Normal ECG No previous ECGs available Confirmed by Pattricia Boss [phone]) on 07/23/2021 1:56:08 PM  Radiology No results found.  Procedures Procedures    Medications Ordered in ED Medications  0.9 %  sodium chloride infusion (has no administration in time range)    ED Course/ Medical Decision Making/ A&P Clinical Course as of 07/23/21 1536  Mon Jul 23, 2021  1508 CBC(!!) CBC reviewed  interpreted hemoglobin is 6.6 White blood cell count normal Platelets elevated 441,000 [DR]  6948 Basic metabolic panel(!) Basic metabolic panel reviewed and interpreted with glucose elevated at 150 Electrolytes are within normal limits Creatinine normal [DR]  1509 Type and screen [facility] Type and screen ordered and pending  [DR]  1509 POC occult blood, ED Point-of-care occult blood is obtained and negative [DR]  1509 Urinalysis significant for few bacteria 21-50 white blood cells moderate leukocyte esterase but no symptoms and doubt acute UTI and probable contaminated specimen [DR]  1510 ED EKG [DR]    Clinical Course User Index [DR] Pattricia Boss, MD                           Medical Decision Making 56 year old female history of type 2 diabetes, hypertension, high cholesterol, iron deficiency anemia, small bowel obstruction presents today complaining of generalized weakness with a fairly sudden onset this morning.  She denies any rectal bleeding, chest pain, or dyspnea Differential diagnosis includes seizure, generalized weakness secondary to infection, metabolic abnormalities including hypoglycemia in a patient with known diabetes, anemia. Patient had labs obtained prior to my evaluation She is noted to be anemic with a hemoglobin of 6.6. Exam done here including rectal with no dark stool and no history of GI bleeding. Patient has not had menstrual cycle for a year and is consistent with post menopausal state Given patient had staring episode, will obtain head CT, I will repeat hemoglobin.  She is typed and crossed and will likely need admission due to her episode of altered mental status and severe anemia 1-unresponsive episode with staring 2 iron deficiency anemia-hemoglobin rechecked and is 6, 2 units of packed red blood cells ordered for transfusion  Amount and/or Complexity of Data Reviewed External Data Reviewed: notes.    Details: Reviewed prior chart  and patient noted to have deficiency anemia in the past Labs: ordered. Decision-making details documented in ED Course. Radiology: ordered and independent interpretation performed. Decision-making details documented in ED Course. ECG/medicine tests: ordered and independent interpretation performed. Decision-making details documented in ED Course.    Details: EKG reviewed interpreted and heart rate is normal at 77 Discussion of management or test interpretation with external provider(s): Care discussed with Dr. Flossie Buffy who will see for admission           Final Clinical Impression(s) / ED Diagnoses Final diagnoses:  Near syncope  Symptomatic anemia    Rx / DC Orders ED Discharge Orders     None         Pattricia Boss, MD 07/23/21 1604

## 2021-07-23 NOTE — Assessment & Plan Note (Signed)
Hold on any SSI for now while she is on clear liquid diet and will be NPO.

## 2021-07-24 ENCOUNTER — Other Ambulatory Visit: Payer: Self-pay

## 2021-07-24 ENCOUNTER — Encounter (HOSPITAL_COMMUNITY): Payer: Self-pay | Admitting: Family Medicine

## 2021-07-24 DIAGNOSIS — E869 Volume depletion, unspecified: Secondary | ICD-10-CM | POA: Diagnosis present

## 2021-07-24 DIAGNOSIS — Z79899 Other long term (current) drug therapy: Secondary | ICD-10-CM | POA: Diagnosis not present

## 2021-07-24 DIAGNOSIS — Z90722 Acquired absence of ovaries, bilateral: Secondary | ICD-10-CM | POA: Diagnosis not present

## 2021-07-24 DIAGNOSIS — E7800 Pure hypercholesterolemia, unspecified: Secondary | ICD-10-CM | POA: Diagnosis present

## 2021-07-24 DIAGNOSIS — D509 Iron deficiency anemia, unspecified: Secondary | ICD-10-CM | POA: Diagnosis not present

## 2021-07-24 DIAGNOSIS — I152 Hypertension secondary to endocrine disorders: Secondary | ICD-10-CM | POA: Diagnosis present

## 2021-07-24 DIAGNOSIS — Z833 Family history of diabetes mellitus: Secondary | ICD-10-CM | POA: Diagnosis not present

## 2021-07-24 DIAGNOSIS — Z6841 Body Mass Index (BMI) 40.0 and over, adult: Secondary | ICD-10-CM | POA: Diagnosis not present

## 2021-07-24 DIAGNOSIS — K5900 Constipation, unspecified: Secondary | ICD-10-CM | POA: Diagnosis present

## 2021-07-24 DIAGNOSIS — K921 Melena: Secondary | ICD-10-CM | POA: Diagnosis present

## 2021-07-24 DIAGNOSIS — I10 Essential (primary) hypertension: Secondary | ICD-10-CM | POA: Diagnosis present

## 2021-07-24 DIAGNOSIS — E1159 Type 2 diabetes mellitus with other circulatory complications: Secondary | ICD-10-CM | POA: Diagnosis not present

## 2021-07-24 DIAGNOSIS — D62 Acute posthemorrhagic anemia: Secondary | ICD-10-CM | POA: Diagnosis present

## 2021-07-24 DIAGNOSIS — D638 Anemia in other chronic diseases classified elsewhere: Secondary | ICD-10-CM | POA: Diagnosis present

## 2021-07-24 DIAGNOSIS — Z7984 Long term (current) use of oral hypoglycemic drugs: Secondary | ICD-10-CM | POA: Diagnosis not present

## 2021-07-24 DIAGNOSIS — R55 Syncope and collapse: Secondary | ICD-10-CM | POA: Diagnosis present

## 2021-07-24 DIAGNOSIS — D649 Anemia, unspecified: Secondary | ICD-10-CM | POA: Diagnosis not present

## 2021-07-24 DIAGNOSIS — E119 Type 2 diabetes mellitus without complications: Secondary | ICD-10-CM | POA: Diagnosis not present

## 2021-07-24 DIAGNOSIS — D539 Nutritional anemia, unspecified: Secondary | ICD-10-CM | POA: Diagnosis present

## 2021-07-24 DIAGNOSIS — E1169 Type 2 diabetes mellitus with other specified complication: Secondary | ICD-10-CM | POA: Diagnosis present

## 2021-07-24 LAB — TYPE AND SCREEN
ABO/RH(D): O POS
Antibody Screen: NEGATIVE
Unit division: 0
Unit division: 0

## 2021-07-24 LAB — CBC
HCT: 24.1 % — ABNORMAL LOW (ref 36.0–46.0)
Hemoglobin: 7.5 g/dL — ABNORMAL LOW (ref 12.0–15.0)
MCH: 20.5 pg — ABNORMAL LOW (ref 26.0–34.0)
MCHC: 31.1 g/dL (ref 30.0–36.0)
MCV: 65.8 fL — ABNORMAL LOW (ref 80.0–100.0)
Platelets: 284 10*3/uL (ref 150–400)
RBC: 3.66 MIL/uL — ABNORMAL LOW (ref 3.87–5.11)
RDW: 22.6 % — ABNORMAL HIGH (ref 11.5–15.5)
WBC: 8.7 10*3/uL (ref 4.0–10.5)
nRBC: 0 % (ref 0.0–0.2)

## 2021-07-24 LAB — BPAM RBC
Blood Product Expiration Date: 202306112359
Blood Product Expiration Date: 202306242359
ISSUE DATE / TIME: 202306051725
ISSUE DATE / TIME: 202306052234
Unit Type and Rh: 5100
Unit Type and Rh: 5100

## 2021-07-24 LAB — GLUCOSE, CAPILLARY
Glucose-Capillary: 106 mg/dL — ABNORMAL HIGH (ref 70–99)
Glucose-Capillary: 112 mg/dL — ABNORMAL HIGH (ref 70–99)
Glucose-Capillary: 187 mg/dL — ABNORMAL HIGH (ref 70–99)
Glucose-Capillary: 112 mg/dL — ABNORMAL HIGH (ref 70–99)

## 2021-07-24 LAB — HEMOGLOBIN AND HEMATOCRIT, BLOOD
HCT: 24.1 % — ABNORMAL LOW (ref 36.0–46.0)
Hemoglobin: 7.4 g/dL — ABNORMAL LOW (ref 12.0–15.0)

## 2021-07-24 MED ORDER — INSULIN ASPART 100 UNIT/ML IJ SOLN: 0.0000 [IU] | Freq: Every day | INTRAMUSCULAR | Status: DC

## 2021-07-24 MED ORDER — PEG 3350-KCL-NA BICARB-NACL 420 G PO SOLR
4000.0000 mL | Freq: Once | ORAL | Status: AC
  Administered 2021-07-24: 4000 mL via ORAL
  Filled 2021-07-24: qty 4000

## 2021-07-24 MED ORDER — INSULIN ASPART 100 UNIT/ML IJ SOLN: 0.0000 [IU] | Freq: Three times a day (TID) | INTRAMUSCULAR | Status: DC

## 2021-07-24 NOTE — TOC Initial Note (Signed)
Transition of Care [facility]) - Initial/Assessment Note    Patient Details  Name: Gabriela Watkins MRN: [id number] Date of Birth: 10-15-1965  Transition of Care [facility] CM/SW Contact:    Ninfa Meeker, RN Phone Number: 07/24/2021, 2:26 PM Transition of Care Screening Note;               Transition of Care Department [facility]) has reviewed patient and no TOC needs have been identified at this time. We will continue to monitor patient advancement through Interdisciplinary progressions. If new patient transition needs arise, please place a consult.   Clinical Narrative:          Patient Goals and CMS Choice        Expected Discharge Plan and Services                                                Prior Living Arrangements/Services                       Activities of Daily Living Home Assistive Devices/Equipment: None ADL Screening (condition at time of admission) Patient's cognitive ability adequate to safely complete daily activities?: Yes Is the patient deaf or have difficulty hearing?: No Does the patient have difficulty seeing, even when wearing glasses/contacts?: No Does the patient have difficulty concentrating, remembering, or making decisions?: No Patient able to express need for assistance with ADLs?: Yes Does the patient have difficulty dressing or bathing?: No Independently performs ADLs?: Yes (appropriate for developmental age) Does the patient have difficulty walking or climbing stairs?: No Weakness of Legs: None Weakness of Arms/Hands: None  Permission Sought/Granted                  Emotional Assessment              Admission diagnosis:  Near syncope [R55] Symptomatic anemia [D64.9] Patient Active Problem List   Diagnosis Date Noted   Symptomatic anemia 07/23/2021   Acute appendicitis 11/28/2020   Iron deficiency anemia 11/21/2016   Vitamin D deficiency 03/02/2015   Hyperlipidemia associated with type 2 diabetes mellitus ([place])  08/02/2013   Small bowel obstruction ([place]) 08/27/2012   Diabetes mellitus ([place]) 08/27/2012   Hypertension associated with diabetes ([place]) 08/27/2012   Obesity, morbid ([place]) 08/27/2012   Hx of hernia repair 08/27/2012   PCP:  Sharion Balloon, FNP Pharmacy:   [id number] - [place], [place] [address] Phone: [phone] Fax: [phone]     Social Determinants of Health (SDOH) Interventions    Readmission Risk Interventions     View : No data to display.

## 2021-07-24 NOTE — Consult Note (Addendum)
Reason for Consult:Iron deficiency anemia. Referring Physician: THP  Gabriela Watkins is an 55 y.o. female.  HPI: 55 year old Hispanic female with multiple medical problem listed below, including  HTN, AODM, IDA, presented to the ER with ?GI bleeding and a near syncopal episode. Noted to have a 6.0 gms/dl on admission requiring 2 units of PRBC's. Hemoglobin was 7.4 g/dL after transfusion.She denies having any abdominal pain, nausea, vomiting, dysphagia odynophagia.  She admits to using nonsteroidals occasionally. She has occasional constipation and can go a day or 2 without having a BM.  Past Medical History:  Diagnosis Date   Anemia    High cholesterol    Hypertension    Small bowel obstruction (HCC)    hospital admission 08/24/2012   Type II diabetes mellitus (HCC)    Past Surgical History:  Procedure Laterality Date   HERNIA REPAIR  ~ 01/2009; 04/2009   "stomach" (08/25/2012)   LEFT OOPHORECTOMY  ~ 2004   "tumor" (08/25/2012)   RIGHT OOPHORECTOMY  ~ 2007   "tumor; only took off a portion of the ovary" (08/25/2012)   Family History  Problem Relation Age of Onset   Diabetes Mother    Social History:  reports that she has never smoked. She has never used smokeless tobacco. She reports that she does not drink alcohol and does not use drugs.  Allergies: No Known Allergies  Medications: I have reviewed the patient's current medications. Prior to Admission:  Medications Prior to Admission  Medication Sig Dispense Refill Last Dose   acetaminophen (TYLENOL) 500 MG tablet Take 1 tablet (500 mg total) by mouth every 6 (six) hours as needed for mild pain or fever.   Past Week   docusate sodium (COLACE) 100 MG capsule Take 1 capsule (100 mg total) by mouth daily as needed for mild constipation.   Past Week   ibuprofen (ADVIL) 200 MG tablet Take 400-600 mg by mouth every 6 (six) hours as needed for mild pain.   Past Week   lisinopril (ZESTRIL) 20 MG tablet TAKE 1 TABLET BY MOUTH EVERY DAY 90 tablet 0  07/23/2021   metFORMIN (GLUCOPHAGE-XR) 500 MG 24 hr tablet TAKE 1 TABLET BY MOUTH EVERY DAY WITH BREAKFAST (Patient taking differently: Take 500 mg by mouth daily with breakfast.) 90 tablet 0 07/22/2021   phentermine 30 MG capsule Take 30 mg by mouth daily.   07/23/2021   topiramate (TOPAMAX) 50 MG tablet Take 50 mg by mouth daily.   07/23/2021   atorvastatin (LIPITOR) 80 MG tablet TAKE 1 TABLET BY MOUTH EVERY DAY (NEEDS TO BE SEEN BEFORE NEXT REFILL) (Patient not taking: Reported on 07/23/2021) 30 tablet 0 Not Taking   blood glucose meter kit and supplies KIT Dispense based on patient and insurance preference. Use up to four times daily as directed. (FOR ICD-9 250.00, 250.01). 1 each 0    glucose blood (ACCU-CHEK GUIDE) test strip Test BS up to 4 times daily Dx E11.9 400 each 3    hydrocortisone (ANUSOL-HC) 2.5 % rectal cream Place 1 application. rectally 2 (two) times daily. (Patient taking differently: Place 1 application. rectally daily as needed for hemorrhoids.) 30 g 0 unknown   Lancets 30G MISC Check blood glucose BID and prn 100 each 6    polyethylene glycol powder (GLYCOLAX/MIRALAX) 17 GM/SCOOP powder Take 17 g by mouth 2 (two) times daily as needed. (Patient not taking: Reported on 07/23/2021) 3350 g 1 Not Taking   Scheduled:  insulin aspart  0-5 Units Subcutaneous QHS   [  START ON 07/25/2021] insulin aspart  0-6 Units Subcutaneous TID WC   pantoprazole (PROTONIX) IV  40 mg Intravenous Q12H   Continuous: PRN:  Results for orders placed or performed during the hospital encounter of 07/23/21 (from the past 48 hour(s))  Urinalysis, Routine w reflex microscopic Urine, Clean Catch     Status: Abnormal   Collection Time: 07/23/21 10:23 AM  Result Value Ref Range   Color, Urine YELLOW YELLOW   APPearance CLOUDY (A) CLEAR   Specific Gravity, Urine 1.023 1.005 - 1.030   pH 5.0 5.0 - 8.0   Glucose, UA NEGATIVE NEGATIVE mg/dL   Hgb urine dipstick NEGATIVE NEGATIVE   Bilirubin Urine NEGATIVE NEGATIVE    Ketones, ur NEGATIVE NEGATIVE mg/dL   Protein, ur 30 (A) NEGATIVE mg/dL   Nitrite NEGATIVE NEGATIVE   Leukocytes,Ua MODERATE (A) NEGATIVE   RBC / HPF 6-10 0 - 5 RBC/hpf   WBC, UA 21-50 0 - 5 WBC/hpf   Bacteria, UA FEW (A) NONE SEEN   Squamous Epithelial / LPF 6-10 0 - 5   Mucus PRESENT    Hyaline Casts, UA PRESENT    Ca Oxalate Crys, UA PRESENT     Comment: Performed at [facility], [address], [place], [place] [address]  Basic metabolic panel     Status: Abnormal   Collection Time: 07/23/21 10:41 AM  Result Value Ref Range   Sodium 139 135 - 145 mmol/L   Potassium 3.7 3.5 - 5.1 mmol/L   Chloride 111 98 - 111 mmol/L   CO2 22 22 - 32 mmol/L   Glucose, Bld 150 (H) 70 - 99 mg/dL    Comment: Glucose reference range applies only to samples taken after fasting for at least 8 hours.   BUN 22 (H) 6 - 20 mg/dL   Creatinine, Ser 0.64 0.44 - 1.00 mg/dL   Calcium 8.9 8.9 - 10.3 mg/dL   GFR, Estimated >60 >60 mL/min    Comment: (NOTE) Calculated using the CKD-EPI Creatinine Equation (2021)    Anion gap 6 5 - 15    Comment: Performed at [facility] [address] [place], [place] [address]  CBC     Status: Abnormal   Collection Time: 07/23/21 10:41 AM  Result Value Ref Range   WBC 9.7 4.0 - 10.5 K/uL   RBC 3.63 (L) 3.87 - 5.11 MIL/uL   Hemoglobin 6.6 (LL) 12.0 - 15.0 g/dL    Comment: REPEATED TO VERIFY Reticulocyte Hemoglobin testing may be clinically indicated, consider ordering this additional test [id number] THIS CRITICAL RESULT HAS VERIFIED AND BEEN CALLED TO CLARK A, RN BY DAVID APPIAH ON 06 05 2023 AT 1129, AND HAS BEEN READ BACK.     HCT 23.3 (L) 36.0 - 46.0 %   MCV 64.2 (L) 80.0 - 100.0 fL   MCH 18.2 (L) 26.0 - 34.0 pg   MCHC 28.3 (L) 30.0 - 36.0 g/dL   RDW 19.1 (H) 11.5 - 15.5 %   Platelets 441 (H) 150 - 400 K/uL    Comment: REPEATED TO VERIFY   nRBC 0.0 0.0 - 0.2 %    Comment: Performed at [facility] [address], [place], [place] [address]   POC occult blood, ED     Status: None   Collection Time: 07/23/21  1:57 PM  Result Value Ref Range   Fecal Occult Bld NEGATIVE NEGATIVE  CBG monitoring, ED     Status: Abnormal   Collection Time: 07/23/21  2:09 PM  Result Value Ref Range   Glucose-Capillary 141 (H) 70 - 99 mg/dL    Comment: Glucose reference range applies only to samples taken after fasting for at least 8 hours.  Type and screen [facility]     Status: None   Collection Time: 07/23/21  2:50 PM  Result Value Ref Range   ABO/RH(D) O POS    Antibody Screen NEG    Sample Expiration 07/26/2021,2359    Unit Number [id number]    Blood Component Type RED CELLS,LR    Unit division 00    Status of Unit ISSUED,FINAL    Transfusion Status OK TO TRANSFUSE    Crossmatch Result      Compatible Performed at [facility], [address] [place], [place] [address]    Unit Number [id number]    Blood Component Type RED CELLS,LR    Unit division 00    Status of Unit ISSUED,FINAL    Transfusion Status OK TO TRANSFUSE    Crossmatch Result Compatible   Hemoglobin     Status: Abnormal   Collection Time: 07/23/21  2:50 PM  Result Value Ref Range   Hemoglobin 6.0 (LL) 12.0 - 15.0 g/dL    Comment: REPEATED TO VERIFY THIS CRITICAL RESULT HAS VERIFIED AND BEEN CALLED TO ROWE C, RN BY DAVID APPIAH ON 06 05 2023 AT 1529, AND HAS BEEN READ BACK.  Performed at [facility], [address] [place], [place] [address]   Prepare RBC (crossmatch)     Status: None   Collection Time: 07/23/21  3:35 PM  Result Value Ref Range   Order Confirmation      ORDER PROCESSED BY BLOOD BANK Performed at [facility], [address] [place], [place] [address]   Glucose, capillary     Status: Abnormal   Collection Time: 07/23/21  9:22 PM  Result Value Ref Range   Glucose-Capillary 125 (H) 70 - 99 mg/dL    Comment: Glucose reference range applies only to samples taken after fasting for at least 8 hours.  CBC      Status: Abnormal   Collection Time: 07/24/21  4:02 AM  Result Value Ref Range   WBC 8.7 4.0 - 10.5 K/uL   RBC 3.66 (L) 3.87 - 5.11 MIL/uL   Hemoglobin 7.5 (L) 12.0 - 15.0 g/dL    Comment: REPEATED TO VERIFY POST TRANSFUSION SPECIMEN Reticulocyte Hemoglobin testing may be clinically indicated, consider ordering this additional test [id number]    HCT 24.1 (L) 36.0 - 46.0 %   MCV 65.8 (L) 80.0 - 100.0 fL   MCH 20.5 (L) 26.0 - 34.0 pg   MCHC 31.1 30.0 - 36.0 g/dL   RDW 22.6 (H) 11.5 - 15.5 %   Platelets 284 150 - 400 K/uL   nRBC 0.0 0.0 - 0.2 %    Comment: Performed at [facility], [address] [place], [place] [address]  Hemoglobin and hematocrit, blood     Status: Abnormal   Collection Time: 07/24/21  4:02 AM  Result Value Ref Range   Hemoglobin 7.4 (L) 12.0 - 15.0 g/dL   HCT 24.1 (L) 36.0 - 46.0 %    Comment: Performed at [facility], [address] [place], [place] [address]  Glucose, capillary     Status: Abnormal   Collection Time: 07/24/21  7:49 AM  Result Value Ref Range   Glucose-Capillary 106 (H) 70 - 99 mg/dL      Comment: Glucose reference range applies only to samples taken after fasting for at least 8 hours.  Glucose, capillary     Status: Abnormal   Collection Time: 07/24/21 11:33 AM  Result Value Ref Range   Glucose-Capillary 112 (H) 70 - 99 mg/dL    Comment: Glucose reference range applies only to samples taken after fasting for at least 8 hours.    CT Head Wo Contrast  Result Date: 07/23/2021 CLINICAL DATA:  Mental status change of unknown cause. Staring episode. EXAM: CT HEAD WITHOUT CONTRAST TECHNIQUE: Contiguous axial images were obtained from the base of the skull through the vertex without intravenous contrast. RADIATION DOSE REDUCTION: This exam was performed according to the departmental dose-optimization program which includes automated exposure control, adjustment of the mA and/or kV according to patient size and/or use of iterative  reconstruction technique. COMPARISON:  None Available. FINDINGS: Brain: The brain shows a normal appearance without evidence of malformation, atrophy, old or acute small or large vessel infarction, mass lesion, hemorrhage, hydrocephalus or extra-axial collection. Arachnoid herniation into the sella, a frequent normal variant. Vascular: No hyperdense vessel. No evidence of atherosclerotic calcification. Skull: Normal.  No traumatic finding.  No focal bone lesion. Sinuses/Orbits: Sinuses are clear. Orbits appear normal. Mastoids are clear. Other: None significant IMPRESSION: Normal head CT. Electronically Signed   By: Mark  Shogry M.D.   On: 07/23/2021 15:42    Review of Systems  Constitutional:  Positive for activity change and fatigue. Negative for appetite change, chills, diaphoresis, fever and unexpected weight change.  HENT: Negative.    Eyes: Negative.   Respiratory:  Positive for shortness of breath. Negative for apnea, cough, choking, chest tightness, wheezing and stridor.   Cardiovascular: Negative.   Gastrointestinal:  Positive for anal bleeding, blood in stool and constipation. Negative for abdominal distention, abdominal pain, diarrhea and nausea.  Endocrine: Negative.   Genitourinary: Negative.   Neurological:  Positive for dizziness, syncope, weakness and light-headedness. Negative for tremors, seizures, facial asymmetry, speech difficulty and numbness.  Psychiatric/Behavioral: Negative.    Blood pressure 139/73, pulse 65, temperature 97.7 F (36.5 C), temperature source Oral, resp. rate 18, height 5' 4" (1.626 m), weight 111.4 kg, SpO2 98 %. Physical Exam Constitutional:      General: She is not in acute distress.    Appearance: She is obese. She is not ill-appearing or diaphoretic.  HENT:     Head: Normocephalic and atraumatic.     Mouth/Throat:     Mouth: Mucous membranes are dry.  Eyes:     Extraocular Movements: Extraocular movements intact.     Pupils: Pupils are equal,  round, and reactive to light.  Cardiovascular:     Rate and Rhythm: Normal rate and regular rhythm.  Pulmonary:     Effort: Pulmonary effort is normal.     Breath sounds: Normal breath sounds.  Abdominal:     General: Bowel sounds are normal. There is no distension.     Palpations: Abdomen is soft. There is no mass.     Tenderness: There is no abdominal tenderness. There is no guarding.     Hernia: No hernia is present.  Musculoskeletal:     Cervical back: Normal range of motion and neck supple.  Skin:    General: Skin is warm and dry.  Neurological:     General: No focal deficit present.     Mental Status: She is alert.  Psychiatric:        Mood and Affect: Mood normal.          Behavior: Behavior normal.        Thought Content: Thought content normal.        Judgment: Judgment normal.   Assessment/Plan: 1) Severe symptomatic ron deficiency anemia-will proceed with EGD/Colonoscopy tomorrow. 2) Constipation. 3) HTN 4) AODM. 5) Morbid obesity.   Jyothi Mann 07/24/2021, 12:19 PM      

## 2021-07-24 NOTE — Progress Notes (Signed)
In person interpretor used to complete informed consent for EGD/Colonoscopy.

## 2021-07-24 NOTE — Progress Notes (Addendum)
PROGRESS NOTE    Gabriela Watkins  YKZ:[id number] DOB: [birth year] DOA: 07/23/2021 PCP: Junie Spencer, FNP   Brief Narrative:  Gabriela Watkins is a 56 y.o. female with medical history significant of hypertension, type 2 diabetes, iron deficiency anemia, morbid obesity who presents with concerns of a pre-syncopal episode -she confirms maroon versus melanotic stool at intake, in the ED patient had profound anemia hemoglobin at 6 requiring 2 unit transfusion of packed red cells overnight.  Admitted for further evaluation and treatment of symptomatic anemia concern for GI bleed, GI consulted..    Patient is mostly Spanish-speaking and requires translator.  Assessment & Plan:   Principal Problem:   Symptomatic anemia Active Problems:   Diabetes mellitus (HCC)   Hypertension associated with diabetes (HCC)   Obesity, morbid (HCC)  Symptomatic anemia Likely secondary to acute blood loss anemia/GI bleed on chronic anemia of chronic disease -Required 2 unit PRBC at intake with a hemoglobin of 6 -GI consulted given maroon to black stool per patient; FOBT is negative -Risk factors include high-dose NSAIDs -IV PPI ongoing   Obesity, morbid (HCC) Pt on phentermine and Topamax for weight lost -resume at discharge.    Hypertension associated with diabetes (HCC) Currently normotensive, hold home medications in the setting of questionable blood loss anemia to avoid hypotension.    Diabetes mellitus (HCC), borderline controlled - A1C 7.4 Currently n.p.o. pending GI evaluation Start low-dose sliding scale, titrate once diet initiated  DVT prophylaxis: None given questionable active bleed as above Code Status: Full Family Communication: None present  Status is: Inpatient  Dispo: The patient is from: Home              Anticipated d/c is to: Home              Anticipated d/c date is: 24 to 48 hours              Patient currently not medically stable for discharge  Consultants:  GI  Procedures:   Pending above  Antimicrobials:  None  Subjective: No acute issues or events overnight, patient's symptoms of fatigue weakness and general malaise have resolved after blood transfusion.  She denies any further episodes of black or maroon stool denies headache fever chills nausea vomiting diarrhea constipation or shortness of breath.  Objective: Vitals:   07/23/21 2238 07/23/21 2253 07/24/21 0023 07/24/21 0530  BP:  137/68 134/70 (!) 145/80  Pulse: 74 73 77 78  Resp:  16 16 15   Temp:  98.5 F (36.9 C) 98.1 F (36.7 C) 98.4 F (36.9 C)  TempSrc:  Oral Oral Oral  SpO2: 100% 100% 100% 100%  Weight:      Height:        Intake/Output Summary (Last 24 hours) at 07/24/2021 0746 Last data filed at 07/24/2021 0023 Gross per 24 hour  Intake 816.72 ml  Output --  Net 816.72 ml   Filed Weights   07/23/21 2037  Weight: 111.4 kg    Examination:  General exam: Appears calm and comfortable  Respiratory system: Clear to auscultation. Respiratory effort normal. Cardiovascular system: S1 & S2 heard, RRR. No JVD, murmurs, rubs, gallops or clicks. No pedal edema. Gastrointestinal system: Abdomen is nondistended, soft and nontender. No organomegaly or masses felt. Normal bowel sounds heard. Central nervous system: Alert and oriented. No focal neurological deficits. Extremities: Symmetric 5 x 5 power. Skin: No rashes, lesions or ulcers Psychiatry: Judgement and insight appear normal. Mood & affect appropriate.  Data Reviewed: I have personally reviewed following labs and imaging studies  CBC: Recent Labs  Lab 07/23/21 1041 07/23/21 1450 07/24/21 0402  WBC 9.7  --  8.7  HGB 6.6* 6.0* 7.5*  7.4*  HCT 23.3*  --  24.1*  24.1*  MCV 64.2*  --  65.8*  PLT 441*  --  284   Basic Metabolic Panel: Recent Labs  Lab 07/23/21 1041  NA 139  K 3.7  CL 111  CO2 22  GLUCOSE 150*  BUN 22*  CREATININE 0.64  CALCIUM 8.9   GFR: Estimated Creatinine Clearance: 99.4 mL/min (by C-G  formula based on SCr of 0.64 mg/dL). Liver Function Tests: No results for input(s): AST, ALT, ALKPHOS, BILITOT, PROT, ALBUMIN in the last 168 hours. No results for input(s): LIPASE, AMYLASE in the last 168 hours. No results for input(s): AMMONIA in the last 168 hours. Coagulation Profile: No results for input(s): INR, PROTIME in the last 168 hours. Cardiac Enzymes: No results for input(s): CKTOTAL, CKMB, CKMBINDEX, TROPONINI in the last 168 hours. BNP (last 3 results) No results for input(s): PROBNP in the last 8760 hours. HbA1C: No results for input(s): HGBA1C in the last 72 hours. CBG: Recent Labs  Lab 07/23/21 1409 07/23/21 2122  GLUCAP 141* 125*   Lipid Profile: No results for input(s): CHOL, HDL, LDLCALC, TRIG, CHOLHDL, LDLDIRECT in the last 72 hours. Thyroid Function Tests: No results for input(s): TSH, T4TOTAL, FREET4, T3FREE, THYROIDAB in the last 72 hours. Anemia Panel: No results for input(s): VITAMINB12, FOLATE, FERRITIN, TIBC, IRON, RETICCTPCT in the last 72 hours. Sepsis Labs: No results for input(s): PROCALCITON, LATICACIDVEN in the last 168 hours.  No results found for this or any previous visit (from the past 240 hour(s)).       Radiology Studies: CT Head Wo Contrast  Result Date: 07/23/2021 CLINICAL DATA:  Mental status change of unknown cause. Staring episode. EXAM: CT HEAD WITHOUT CONTRAST TECHNIQUE: Contiguous axial images were obtained from the base of the skull through the vertex without intravenous contrast. RADIATION DOSE REDUCTION: This exam was performed according to the departmental dose-optimization program which includes automated exposure control, adjustment of the mA and/or kV according to patient size and/or use of iterative reconstruction technique. COMPARISON:  None Available. FINDINGS: Brain: The brain shows a normal appearance without evidence of malformation, atrophy, old or acute small or large vessel infarction, mass lesion, hemorrhage,  hydrocephalus or extra-axial collection. Arachnoid herniation into the sella, a frequent normal variant. Vascular: No hyperdense vessel. No evidence of atherosclerotic calcification. Skull: Normal.  No traumatic finding.  No focal bone lesion. Sinuses/Orbits: Sinuses are clear. Orbits appear normal. Mastoids are clear. Other: None significant IMPRESSION: Normal head CT. Electronically Signed   By: Paulina Fusi M.D.   On: 07/23/2021 15:42    Scheduled Meds:  pantoprazole (PROTONIX) IV  40 mg Intravenous Q12H   Continuous Infusions:   LOS: 0 days   Time spent:  Azucena Fallen, DO [facility]  If 7PM-7AM, please contact night-coverage [email or web address]  07/24/2021, 7:46 AM

## 2021-07-25 ENCOUNTER — Encounter (HOSPITAL_COMMUNITY)
Admission: EM | Disposition: A | Discharge status: Home / Self Care | Payer: Self-pay | Source: Home / Self Care | Attending: Family Medicine

## 2021-07-25 ENCOUNTER — Inpatient Hospital Stay (HOSPITAL_COMMUNITY): Payer: Managed Care, Other (non HMO) | Admitting: Certified Registered"

## 2021-07-25 DIAGNOSIS — I10 Essential (primary) hypertension: Secondary | ICD-10-CM

## 2021-07-25 DIAGNOSIS — D649 Anemia, unspecified: Secondary | ICD-10-CM | POA: Diagnosis not present

## 2021-07-25 DIAGNOSIS — E1169 Type 2 diabetes mellitus with other specified complication: Secondary | ICD-10-CM | POA: Diagnosis not present

## 2021-07-25 DIAGNOSIS — D509 Iron deficiency anemia, unspecified: Secondary | ICD-10-CM

## 2021-07-25 DIAGNOSIS — K921 Melena: Secondary | ICD-10-CM | POA: Diagnosis not present

## 2021-07-25 DIAGNOSIS — E119 Type 2 diabetes mellitus without complications: Secondary | ICD-10-CM | POA: Diagnosis not present

## 2021-07-25 DIAGNOSIS — E1159 Type 2 diabetes mellitus with other circulatory complications: Secondary | ICD-10-CM | POA: Diagnosis not present

## 2021-07-25 HISTORY — PX: COLONOSCOPY WITH PROPOFOL: SHX5780

## 2021-07-25 HISTORY — PX: GIVENS CAPSULE STUDY: SHX5432

## 2021-07-25 HISTORY — PX: ESOPHAGOGASTRODUODENOSCOPY (EGD) WITH PROPOFOL: SHX5813

## 2021-07-25 LAB — CBC
HCT: 25.8 % — ABNORMAL LOW (ref 36.0–46.0)
Hemoglobin: 7.9 g/dL — ABNORMAL LOW (ref 12.0–15.0)
MCH: 20.4 pg — ABNORMAL LOW (ref 26.0–34.0)
MCHC: 30.6 g/dL (ref 30.0–36.0)
MCV: 66.7 fL — ABNORMAL LOW (ref 80.0–100.0)
Platelets: 389 10*3/uL (ref 150–400)
RBC: 3.87 MIL/uL (ref 3.87–5.11)
RDW: 23 % — ABNORMAL HIGH (ref 11.5–15.5)
WBC: 8.4 10*3/uL (ref 4.0–10.5)
nRBC: 0 % (ref 0.0–0.2)

## 2021-07-25 LAB — BASIC METABOLIC PANEL
Anion gap: 7 (ref 5–15)
BUN: 14 mg/dL (ref 6–20)
CO2: 25 mmol/L (ref 22–32)
Calcium: 8.6 mg/dL — ABNORMAL LOW (ref 8.9–10.3)
Chloride: 106 mmol/L (ref 98–111)
Creatinine, Ser: 0.58 mg/dL (ref 0.44–1.00)
GFR, Estimated: >60 mL/min (ref >60)
Glucose, Bld: 109 mg/dL — ABNORMAL HIGH (ref 70–99)
Potassium: 3.6 mmol/L (ref 3.5–5.1)
Sodium: 138 mmol/L (ref 135–145)

## 2021-07-25 LAB — GLUCOSE, CAPILLARY
Glucose-Capillary: 113 mg/dL — ABNORMAL HIGH (ref 70–99)
Glucose-Capillary: 110 mg/dL — ABNORMAL HIGH (ref 70–99)
Glucose-Capillary: 104 mg/dL — ABNORMAL HIGH (ref 70–99)
Glucose-Capillary: 112 mg/dL — ABNORMAL HIGH (ref 70–99)

## 2021-07-25 SURGERY — ESOPHAGOGASTRODUODENOSCOPY (EGD) WITH PROPOFOL
Anesthesia: Monitor Anesthesia Care

## 2021-07-25 MED ORDER — PROPOFOL 500 MG/50ML IV EMUL
INTRAVENOUS | Status: DC | PRN
  Administered 2021-07-25 (×2): 125 ug/kg/min via INTRAVENOUS

## 2021-07-25 MED ORDER — SODIUM CHLORIDE 0.9 % IV SOLN: INTRAVENOUS | Status: DC

## 2021-07-25 MED ORDER — LACTATED RINGERS IV SOLN: INTRAVENOUS | Status: DC | PRN

## 2021-07-25 MED ORDER — PROPOFOL 10 MG/ML IV BOLUS
INTRAVENOUS | Status: DC | PRN
  Administered 2021-07-25: 90 mg via INTRAVENOUS

## 2021-07-25 MED ORDER — GLYCOPYRROLATE PF 0.2 MG/ML IJ SOSY
PREFILLED_SYRINGE | INTRAMUSCULAR | Status: DC | PRN
  Administered 2021-07-25: .1 mg via INTRAVENOUS

## 2021-07-25 MED ORDER — LISINOPRIL 20 MG PO TABS
20.0000 mg | ORAL_TABLET | Freq: Every day | ORAL | Status: DC
  Administered 2021-07-25 – 2021-07-26 (×2): 20 mg via ORAL
  Filled 2021-07-25 (×2): qty 1

## 2021-07-25 MED ORDER — LIDOCAINE 2% (20 MG/ML) 5 ML SYRINGE
INTRAMUSCULAR | Status: DC | PRN
  Administered 2021-07-25: 100 mg via INTRAVENOUS

## 2021-07-25 SURGICAL SUPPLY — 25 items

## 2021-07-25 NOTE — Transfer of Care (Signed)
Immediate Anesthesia Transfer of Care Note  Patient: Gabriela Watkins  Procedure(s) Performed: ESOPHAGOGASTRODUODENOSCOPY (EGD) WITH PROPOFOL COLONOSCOPY WITH PROPOFOL GIVENS CAPSULE STUDY  Patient Location: Endoscopy Unit  Anesthesia Type:MAC  Level of Consciousness: drowsy and patient cooperative  Airway & Oxygen Therapy: Patient Spontanous Breathing  Post-op Assessment: Report given to RN and Post -op Vital signs reviewed and stable  Post vital signs: Reviewed and stable  Last Vitals:  Vitals Value Taken Time  BP 111/40 07/25/21 1633  Temp    Pulse 73 07/25/21 1634  Resp 26 07/25/21 1634  SpO2 100 % 07/25/21 1634  Vitals shown include unvalidated device data.  Last Pain:  Vitals:   07/25/21 1444  TempSrc: Temporal  PainSc: 0-No pain      Patients Stated Pain Goal: 0 (28/00/34 9179)  Complications: No notable events documented.

## 2021-07-25 NOTE — Anesthesia Preprocedure Evaluation (Signed)
Anesthesia Evaluation  Patient identified by MRN, date of birth, ID band Patient awake    Reviewed: Allergy & Precautions, NPO status , Patient's Chart, lab work & pertinent test results  Airway Mallampati: II  TM Distance: >3 FB Neck ROM: Full    Dental no notable dental hx.    Pulmonary neg pulmonary ROS,    Pulmonary exam normal breath sounds clear to auscultation       Cardiovascular hypertension, Pt. on medications negative cardio ROS Normal cardiovascular exam Rhythm:Regular Rate:Normal     Neuro/Psych negative neurological ROS  negative psych ROS   GI/Hepatic negative GI ROS, Neg liver ROS,   Endo/Other  diabetes, Type 2Morbid obesity  Renal/GU negative Renal ROS  negative genitourinary   Musculoskeletal negative musculoskeletal ROS (+)   Abdominal (+) + obese,   Peds negative pediatric ROS (+)  Hematology  (+) Blood dyscrasia, anemia ,   Anesthesia Other Findings   Reproductive/Obstetrics negative OB ROS                             Anesthesia Physical Anesthesia Plan  ASA: 3  Anesthesia Plan: MAC   Post-op Pain Management: Minimal or no pain anticipated   Induction: Intravenous  PONV Risk Score and Plan: 2 and Propofol infusion and Treatment may vary due to age or medical condition  Airway Management Planned: Nasal Cannula  Additional Equipment:   Intra-op Plan:   Post-operative Plan:   Informed Consent: I have reviewed the patients History and Physical, chart, labs and discussed the procedure including the risks, benefits and alternatives for the proposed anesthesia with the patient or authorized representative who has indicated his/her understanding and acceptance.     Dental advisory given  Plan Discussed with: CRNA  Anesthesia Plan Comments:         Anesthesia Quick Evaluation

## 2021-07-25 NOTE — Progress Notes (Signed)
  Progress Note  Patient: Gabriela Watkins BSJ:[id number] DOB: 12/06/1965  DOA: 07/23/2021  DOS: 07/25/2021    Brief hospital course: Gabriela Watkins is a 56 y.o. female with a history of obesity, HTN, T2DM, IDA, and appendicitis Oct 2022 who presented to the ED with near syncopal episode in setting of melena found to have symptomatic anemia with hgb 6g/dl.   Assessment and Plan: Symptomatic iron deficiency anemia: Presumably due to acute on chronic blood loss.  - s/p 2u PRBCs hgb 6.0 >>  - EGD, colonoscopy today were unrevealing, VCE recommended by GI.  - Continue serial H/H.  - Suspect we can discontinue PPI but will defer to GI.  - Clears  HTN:  - Reorder home lisinopril  NIDT2DM: Well controlled, HbA1c 6.7%  Morbid obesity: Body mass index is 42.16 kg/m.  - Hold phentermine, topamax for now  Subjective: No further bleeding reported, tolerated prep. No abd pain, N/V. Spanish VRI Lanora Manis [phone] used throughout encounter.  Objective: Vitals:   07/25/21 1634 07/25/21 1643 07/25/21 1653 07/25/21 1711  BP: (!) 111/40 (!) 155/69 (!) 149/75 (!) 149/73  Pulse: 74 73 69 64  Resp: (!) 25 19 17 16   Temp: 97.7 F (36.5 C)   (!) 97.5 F (36.4 C)  TempSrc: Temporal   Oral  SpO2: 100% 99% 100% 99%  Weight:      Height:       Gen: 56 y.o. female in no distress Pulm: Nonlabored breathing room air. Clear CV: Regular rate and rhythm. No murmur, rub, or gallop. No JVD, no dependent edema. GI: Abdomen soft, non-tender, non-distended, with normoactive bowel sounds.  Ext: Warm, no deformities Skin: No rashes, lesions or ulcers on visualized skin. Neuro: Alert and oriented. No focal neurological deficits. Psych: Judgement and insight appear fair. Mood euthymic & affect congruent. Behavior is appropriate.    Data Personally reviewed: CBC: Recent Labs  Lab 07/23/21 1041 07/23/21 1450 07/24/21 0402 07/25/21 0338  WBC 9.7  --  8.7 8.4  HGB 6.6* 6.0* 7.5*  7.4* 7.9*  HCT 23.3*  --  24.1*   24.1* 25.8*  MCV 64.2*  --  65.8* 66.7*  PLT 441*  --  284 389   Basic Metabolic Panel: Recent Labs  Lab 07/23/21 1041 07/25/21 0338  NA 139 138  K 3.7 3.6  CL 111 106  CO2 22 25  GLUCOSE 150* 109*  BUN 22* 14  CREATININE 0.64 0.58  CALCIUM 8.9 8.6*   CBG: Recent Labs  Lab 07/24/21 1628 07/24/21 2131 07/25/21 0738 07/25/21 1208 07/25/21 1709  GLUCAP 187* 112* 113* 110* 104*   Urine analysis:    Component Value Date/Time   COLORURINE YELLOW 07/23/2021 1023   APPEARANCEUR CLOUDY (A) 07/23/2021 1023   LABSPEC 1.023 07/23/2021 1023   PHURINE 5.0 07/23/2021 1023   GLUCOSEU NEGATIVE 07/23/2021 1023   HGBUR NEGATIVE 07/23/2021 1023   BILIRUBINUR NEGATIVE 07/23/2021 1023   KETONESUR NEGATIVE 07/23/2021 1023   PROTEINUR 30 (A) 07/23/2021 1023   UROBILINOGEN 0.2 08/24/2012 0250   NITRITE NEGATIVE 07/23/2021 1023   LEUKOCYTESUR MODERATE (A) 07/23/2021 1023   Family Communication: None at bedside  Disposition: Status is: Inpatient Remains inpatient appropriate because: Serial H/H and VCE Planned Discharge Destination: Home  09/22/2021, MD 07/25/2021 5:12 PM Page by [email or web address]

## 2021-07-25 NOTE — Anesthesia Postprocedure Evaluation (Signed)
Anesthesia Post Note  Patient: [name]  Procedure(s) Performed: ESOPHAGOGASTRODUODENOSCOPY (EGD) WITH PROPOFOL COLONOSCOPY WITH PROPOFOL GIVENS CAPSULE STUDY     Patient location during evaluation: Endoscopy Anesthesia Type: MAC Level of consciousness: oriented, awake and alert and awake Pain management: pain level controlled Vital Signs Assessment: post-procedure vital signs reviewed and stable Respiratory status: spontaneous breathing, nonlabored ventilation, respiratory function stable and patient connected to nasal cannula oxygen Cardiovascular status: blood pressure returned to baseline and stable Postop Assessment: no headache, no backache and no apparent nausea or vomiting Anesthetic complications: no   No notable events documented.  Last Vitals:  Vitals:   07/25/21 1653 07/25/21 1711  BP: (!) 149/75 (!) 149/73  Pulse: 69 64  Resp: 17 16  Temp:  (!) 36.4 C  SpO2: 100% 99%    Last Pain:  Vitals:   07/25/21 1711  TempSrc: Oral  PainSc: 0-No pain                 Santa Lighter

## 2021-07-25 NOTE — Op Note (Addendum)
[facility] Patient Name: Gabriela Watkins Procedure Date : 07/25/2021 MRN: [id number] Attending MD: Juanita Craver , MD Date of Birth: [birth year] CSN: [id number] Age: 56 Admit Type: Inpatient Procedure:                Diagnostic EGD. Indications:              Iron deficiency anemia, Hematochezia Providers:                Juanita Craver, MD, Glori Bickers, RN, Darliss Cheney,                            Technician, Lance Coon, CRNA Referring MD:             Theador Hawthorne Hawks. Medicines:                Monitored Anesthesia Care Complications:            No immediate complications. Estimated Blood Loss:     Estimated blood loss: none. Procedure:                Pre-Anesthesia Assessment: - Prior to the                            procedure, a history and physical was performed,                            and patient medications and allergies were                            reviewed. The patient's tolerance of previous                            anesthesia was also reviewed. The risks and                            benefits of the procedure and the sedation options                            and risks were discussed with the patient. All                            questions were answered, and informed consent was                            obtained. Prior Anticoagulants: The patient has                            taken no previous anticoagulant or antiplatelet                            agents except for NSAID medication. ASA Grade II.                            After reviewing the risks and benefits, the patient  was deemed in satisfactory condition to undergo the                            procedure. After obtaining informed consent, the                            endoscope was passed under direct vision.                            Throughout the procedure, the patient's blood                            pressure, pulse, and oxygen saturations were                             monitored continuously. The [id number]-[id number] ([id number])                            Olympus endoscope was introduced through the mouth,                            and advanced to the second part of duodenum. The                            EGD was accomplished without difficulty. The                            patient tolerated the procedure well. Scope In: Scope Out: Findings:      The examined esophagus and GEJ appeared widely patent and normal.      The entire examined stomach was normal.      The cardia and gastric fundus were normal on retroflexion.      The examined duodenum was normal. Impression:               - Normal appearing, widely patent esophagus and GEJ.                           - Normal appearing stomach.                           - Normal examined duodenum.                           - No specimens collected. Moderate Sedation:      MAC used. Recommendation:           - Clear liquid diet today.                           - To visualize the small bowel, perform video                            capsule endoscopy today.                           - Continue serial CBC's Procedure Code(s):        ---  Professional ---                           [id number], Esophagogastroduodenoscopy, flexible,                            transoral; diagnostic, including collection of                            specimen(s) by brushing or washing, when performed                            (separate procedure) Diagnosis Code(s):        --- Professional ---                           K92.1, Melena (includes Hematochezia)                           D50.9, Iron deficiency anemia, unspecified CPT copyright 2019 American Medical Association. All rights reserved. The codes documented in this report are preliminary and upon coder review may  be revised to meet current compliance requirements. Juanita Craver, MD Juanita Craver, MD 07/25/2021 4:36:04 PM This report has been signed electronically. Number of Addenda:  0

## 2021-07-25 NOTE — Op Note (Signed)
[facility] Patient Name: Gabriela Watkins Procedure Date : 07/25/2021 MRN: [id number] Attending MD: Juanita Craver , MD Date of Birth: Jan 06, 1966 CSN: [id number] Age: 56 Admit Type: Inpatient Procedure:                Diagnostic colonoscopy. Indications:              Blood in stool, Iron deficiency anemia, CRC                            screening. Providers:                Juanita Craver, MD, Glori Bickers, RN, Darliss Cheney,                            Technician, Lance Coon, CRNA Referring MD:             Theador Hawthorne Hawks. Medicines:                Monitored Anesthesia Care Complications:            No immediate complications. Estimated Blood Loss:     Estimated blood loss: none. Procedure:                Pre-Anesthesia Assessment: - Prior to the                            procedure, a history and physical was performed,                            and patient medications and allergies were                            reviewed. The patient's tolerance of previous                            anesthesia was also reviewed. The risks and                            benefits of the procedure and the sedation options                            and risks were discussed with the patient. All                            questions were answered, and informed consent was                            obtained. Prior Anticoagulants: The patient has                            taken no previous anticoagulant or antiplatelet                            agents except for NSAID medication. ASA Grade  Assessment: II - A patient with mild systemic                            disease. After reviewing the risks and benefits,                            the patient was deemed in satisfactory condition to                            undergo the procedure.                           - Prior to the procedure, a History and Physical                            was performed, and patient  medications and                            allergies were reviewed. The patient's tolerance of                            previous anesthesia was also reviewed. The risks                            and benefits of the procedure and the sedation                            options and risks were discussed with the patient.                            All questions were answered, and informed consent                            was obtained. Prior Anticoagulants: The patient has                            taken no previous anticoagulant or antiplatelet                            agents except for NSAID medication. ASA Grade                            Assessment: II - A patient with mild systemic                            disease. After reviewing the risks and benefits,                            the patient was deemed in satisfactory condition to                            undergo the procedure.  After obtaining informed consent, the colonoscope                            was passed under direct vision. Throughout the                            procedure, the patient's blood pressure, pulse, and                            oxygen saturations were monitored continuously. The                            [id number]-[id number] ([id number]) Olympus coloscope was                            introduced through the anus and advanced to the the                            cecum, identified by appendiceal orifice and                            ileocecal valve. The colonoscopy was extremely                            difficult due to inadequate bowel prep. Successful                            completion of the procedure was aided by lavage.                            The ileocecal valve, the appendiceal orifice and                            the rectum were photographed. The quality of the                            bowel preparation was evaluated using the BBPS                            [facility]  Bowel Preparation Scale) with scores of:                            Right Colon = 2 (minor amount of residual staining,                            small fragments of stool and/or opaque liquid, but                            mucosa seen well), Transverse Colon = 2 (minor                            amount of residual staining, small fragments of  stool and/or opaque liquid, but mucosa seen well)                            and Left Colon = 2 (minor amount of residual                            staining, small fragments of stool and/or opaque                            liquid, but mucosa seen well). The total BBPS score                            equals 6. The quality of the bowel preparation was                            fair. The bowel preparation used was NuLytely via                            single dose instruction. Scope In: 4:07:30 PM Scope Out: 4:27:57 PM Scope Withdrawal Time: 0 hours 7 minutes 9 seconds  Total Procedure Duration: 0 hours 20 minutes 27 seconds  Findings:      The examined portion of the colon appeared normal.      No additional abnormalities were found on retroflexion.      Large amount of debris in the colon-polyps/lesions could be missed. Impression:               - Preparation of the colon was fair at best with                            aggressive lavage.                           - The examined colon appeared normal.                           - No specimens collected. Moderate Sedation:      MAC used. Recommendation:           - Clear liquid diet today.                           - Repeat colonoscopy in 1 year for surveillance                            with a 2 week prep.                           - Return to GI office in 2 weeks.                           - To visualize the small bowel, perform video                            capsule endoscopy today. Procedure Code(s):        --- Professional ---  45378, Colonoscopy, flexible; diagnostic, including                            collection of specimen(s) by brushing or washing,                            when performed (separate procedure) Diagnosis Code(s):        --- Professional ---                           K92.1, Melena (includes Hematochezia)                           D50.9, Iron deficiency anemia, unspecified                           Z12.11, Encounter for screening for malignant                            neoplasm of colon CPT copyright 2019 American Medical Association. All rights reserved. The codes documented in this report are preliminary and upon coder review may  be revised to meet current compliance requirements. Juanita Craver, MD Juanita Craver, MD 07/25/2021 4:42:19 PM This report has been signed electronically. Number of Addenda: 0

## 2021-07-26 ENCOUNTER — Encounter (HOSPITAL_COMMUNITY): Payer: Self-pay | Admitting: Gastroenterology

## 2021-07-26 DIAGNOSIS — D649 Anemia, unspecified: Secondary | ICD-10-CM | POA: Diagnosis not present

## 2021-07-26 DIAGNOSIS — E1159 Type 2 diabetes mellitus with other circulatory complications: Secondary | ICD-10-CM | POA: Diagnosis not present

## 2021-07-26 DIAGNOSIS — E1169 Type 2 diabetes mellitus with other specified complication: Secondary | ICD-10-CM | POA: Diagnosis not present

## 2021-07-26 LAB — BASIC METABOLIC PANEL
Anion gap: 7 (ref 5–15)
BUN: 10 mg/dL (ref 6–20)
CO2: 22 mmol/L (ref 22–32)
Calcium: 8.7 mg/dL — ABNORMAL LOW (ref 8.9–10.3)
Chloride: 109 mmol/L (ref 98–111)
Creatinine, Ser: 0.52 mg/dL (ref 0.44–1.00)
GFR, Estimated: >60 mL/min (ref >60)
Glucose, Bld: 109 mg/dL — ABNORMAL HIGH (ref 70–99)
Potassium: 3.3 mmol/L — ABNORMAL LOW (ref 3.5–5.1)
Sodium: 138 mmol/L (ref 135–145)

## 2021-07-26 LAB — CBC
HCT: 26.7 % — ABNORMAL LOW (ref 36.0–46.0)
Hemoglobin: 8.1 g/dL — ABNORMAL LOW (ref 12.0–15.0)
MCH: 20.5 pg — ABNORMAL LOW (ref 26.0–34.0)
MCHC: 30.3 g/dL (ref 30.0–36.0)
MCV: 67.6 fL — ABNORMAL LOW (ref 80.0–100.0)
Platelets: 355 10*3/uL (ref 150–400)
RBC: 3.95 MIL/uL (ref 3.87–5.11)
RDW: 23.1 % — ABNORMAL HIGH (ref 11.5–15.5)
WBC: 7.1 10*3/uL (ref 4.0–10.5)
nRBC: 0 % (ref 0.0–0.2)

## 2021-07-26 LAB — GLUCOSE, CAPILLARY: Glucose-Capillary: 110 mg/dL — ABNORMAL HIGH (ref 70–99)

## 2021-07-26 MED ORDER — FERROUS SULFATE 325 (65 FE) MG PO TABS: 325.0000 mg | ORAL_TABLET | Freq: Every day | ORAL | 0 refills | Status: DC

## 2021-07-26 MED ORDER — ACETAMINOPHEN 325 MG PO TABS
650.0000 mg | ORAL_TABLET | Freq: Four times a day (QID) | ORAL | Status: DC | PRN
  Administered 2021-07-26: 650 mg via ORAL
  Filled 2021-07-26: qty 2

## 2021-07-26 MED ORDER — POTASSIUM CHLORIDE CRYS ER 20 MEQ PO TBCR
40.0000 meq | EXTENDED_RELEASE_TABLET | Freq: Once | ORAL | Status: AC
Start: 2021-07-26 — End: 2021-07-26
  Administered 2021-07-26: 40 meq via ORAL
  Filled 2021-07-26: qty 2

## 2021-07-26 NOTE — Discharge Summary (Signed)
Physician Discharge Summary   Patient: Gabriela Watkins MRN: [id number] DOB: Dec 06, 1965  Admit date:     07/23/2021  Discharge date: 07/26/21  Discharge Physician: Gabriela Watkins   PCP: Gabriela Balloon, FNP   Recommendations at discharge:  Follow up with GI, Dr. Collene Mares, in the next couple weeks to discuss results of the video capsule endoscopy complete 6/8. Recheck CBC at follow up. Suggest checking iron panel (not sent prior to transfusion).   Discharge Diagnoses: Principal Problem:   Symptomatic anemia Active Problems:   Diabetes mellitus ([place])   Hypertension associated with diabetes ([place])   Obesity, morbid [facility])  Hospital Course: Gabriela Watkins is a 56 y.o. female with a history of obesity, HTN, T2DM, IDA, and appendicitis Oct 2022 who presented to the ED with near syncopal episode in setting of melena found to have symptomatic anemia with hgb 6g/dl.   Assessment and Plan: Symptomatic iron deficiency anemia: Presumably due to acute on chronic blood loss.  - s/p 2u PRBCs hgb 6.0 >> 8.1 - EGD, colonoscopy today were unrevealing, VCE completed but not interpreted prior to discharge. D/w Dr. Collene Mares who will follow up the results at a follow up appointment with the patient.   HTN:  - Continue home med   NIDT2DM: Well controlled, HbA1c 6.7%   Morbid obesity: Body mass index is 42.16 kg/m.  - Continue home med  Consultants: GI Procedures performed: EGD/colonoscopy 07/25/2021 Dr. Collene Mares  Disposition: Home Diet recommendation:  Cardiac and Carb modified diet DISCHARGE MEDICATION: Allergies as of 07/26/2021   No Known Allergies      Medication List     TAKE these medications    Accu-Chek Guide test strip Generic drug: glucose blood Test BS up to 4 times daily Dx E11.9   acetaminophen 500 MG tablet Commonly known as: TYLENOL Take 1 tablet (500 mg total) by mouth every 6 (six) hours as needed for mild pain or fever.   atorvastatin 80 MG tablet Commonly known as: LIPITOR TAKE 1  TABLET BY MOUTH EVERY DAY (NEEDS TO BE SEEN BEFORE NEXT REFILL)   blood glucose meter kit and supplies Kit Dispense based on patient and insurance preference. Use up to four times daily as directed. (FOR ICD-9 250.00, 250.01).   docusate sodium 100 MG capsule Commonly known as: COLACE Take 1 capsule (100 mg total) by mouth daily as needed for mild constipation.   hydrocortisone 2.5 % rectal cream Commonly known as: Anusol-HC Place 1 application. rectally 2 (two) times daily. What changed:  when to take this reasons to take this   ibuprofen 200 MG tablet Commonly known as: ADVIL Take 400-600 mg by mouth every 6 (six) hours as needed for mild pain.   Lancets 30G Misc Check blood glucose BID and prn   lisinopril 20 MG tablet Commonly known as: ZESTRIL TAKE 1 TABLET BY MOUTH EVERY DAY   metFORMIN 500 MG 24 hr tablet Commonly known as: GLUCOPHAGE-XR TAKE 1 TABLET BY MOUTH EVERY DAY WITH BREAKFAST What changed:  how much to take how to take this when to take this additional instructions   phentermine 30 MG capsule Take 30 mg by mouth daily.   polyethylene glycol powder 17 GM/SCOOP powder Commonly known as: GLYCOLAX/MIRALAX Take 17 g by mouth 2 (two) times daily as needed.   topiramate 50 MG tablet Commonly known as: TOPAMAX Take 50 mg by mouth daily.        Follow-up Information     Gabriela Balloon, FNP. Schedule  an appointment as soon as possible for a visit.   Specialty: Family Medicine Contact information: [place] [address] [phone]         Gabriela Craver, MD Follow up.   Specialty: Gastroenterology Why: Call next week if you are not contacted for an appointment Contact information: [address] [place] [address] [phone]                Discharge Exam: Filed Weights   07/23/21 2037 07/25/21 1444  Weight: 111.4 kg 111.4 kg  BP 135/69 (BP Location: Right Wrist)   Pulse 73   Temp 98.5 F  (36.9 C) (Oral)   Resp 18   Ht _0  (1.626 m)   Wt 111.4 kg   SpO2 94%   BMI 42.16 kg/m   Obese, no distress RRR Nonlabored, clear Soft, NT, ND  Condition at discharge: stable  The results of significant diagnostics from this hospitalization (including imaging, microbiology, ancillary and laboratory) are listed below for reference.   Imaging Studies: CT Head Wo Contrast  Result Date: 07/23/2021 CLINICAL DATA:  Mental status change of unknown cause. Staring episode. EXAM: CT HEAD WITHOUT CONTRAST TECHNIQUE: Contiguous axial images were obtained from the base of the skull through the vertex without intravenous contrast. RADIATION DOSE REDUCTION: This exam was performed according to the departmental dose-optimization program which includes automated exposure control, adjustment of the mA and/or kV according to patient size and/or use of iterative reconstruction technique. COMPARISON:  None Available. FINDINGS: Brain: The brain shows a normal appearance without evidence of malformation, atrophy, old or acute small or large vessel infarction, mass lesion, hemorrhage, hydrocephalus or extra-axial collection. Arachnoid herniation into the sella, a frequent normal variant. Vascular: No hyperdense vessel. No evidence of atherosclerotic calcification. Skull: Normal.  No traumatic finding.  No focal bone lesion. Sinuses/Orbits: Sinuses are clear. Orbits appear normal. Mastoids are clear. Other: None significant IMPRESSION: Normal head CT. Electronically Signed   By: Nelson Chimes M.D.   On: 07/23/2021 15:42    Microbiology: Results for orders placed or performed during the hospital encounter of 11/28/20  Resp Panel by RT-PCR (Flu A&B, Covid) Nasopharyngeal Swab     Status: None   Collection Time: 11/28/20  5:22 PM   Specimen: Nasopharyngeal Swab; Nasopharyngeal(NP) swabs in vial transport medium  Result Value Ref Range Status   SARS Coronavirus 2 by RT PCR NEGATIVE NEGATIVE Final    Comment:  (NOTE) SARS-CoV-2 target nucleic acids are NOT DETECTED.  The SARS-CoV-2 RNA is generally detectable in upper respiratory specimens during the acute phase of infection. The lowest concentration of SARS-CoV-2 viral copies this assay can detect is 138 copies/mL. A negative result does not preclude SARS-Cov-2 infection and should not be used as the sole basis for treatment or other patient management decisions. A negative result may occur with  improper specimen collection/handling, submission of specimen other than nasopharyngeal swab, presence of viral mutation(s) within the areas targeted by this assay, and inadequate number of viral copies(<138 copies/mL). A negative result must be combined with clinical observations, patient history, and epidemiological information. The expected result is Negative.  Fact Sheet for Patients:  EntrepreneurPulse.com.au  Fact Sheet for Healthcare Providers:  IncredibleEmployment.be  This test is no t yet approved or cleared by the [place] FDA and  has been authorized for detection and/or diagnosis of SARS-CoV-2 by FDA under an Emergency Use Authorization (EUA). This EUA will remain  in effect (meaning this test can be  used) for the duration of the COVID-19 declaration under Section 564(b)(1) of the Act, 21 U.S.C.section 360bbb-3(b)(1), unless the authorization is terminated  or revoked sooner.       Influenza A by PCR NEGATIVE NEGATIVE Final   Influenza B by PCR NEGATIVE NEGATIVE Final    Comment: (NOTE) The Xpert Xpress SARS-CoV-2/FLU/RSV plus assay is intended as an aid in the diagnosis of influenza from Nasopharyngeal swab specimens and should not be used as a sole basis for treatment. Nasal washings and aspirates are unacceptable for Xpert Xpress SARS-CoV-2/FLU/RSV testing.  Fact Sheet for Patients: EntrepreneurPulse.com.au  Fact Sheet for Healthcare  Providers: IncredibleEmployment.be  This test is not yet approved or cleared by the [place] FDA and has been authorized for detection and/or diagnosis of SARS-CoV-2 by FDA under an Emergency Use Authorization (EUA). This EUA will remain in effect (meaning this test can be used) for the duration of the COVID-19 declaration under Section 564(b)(1) of the Act, 21 U.S.C. section 360bbb-3(b)(1), unless the authorization is terminated or revoked.  Performed at [facility], [place] [address] [place], [place] [address]     Labs: CBC: Recent Labs  Lab 07/23/21 1041 07/23/21 1450 07/24/21 0402 07/25/21 0338 07/26/21 0155  WBC 9.7  --  8.7 8.4 7.1  HGB 6.6* 6.0* 7.5*  7.4* 7.9* 8.1*  HCT 23.3*  --  24.1*  24.1* 25.8* 26.7*  MCV 64.2*  --  65.8* 66.7* 67.6*  PLT 441*  --  284 389 441   Basic Metabolic Panel: Recent Labs  Lab 07/23/21 1041 07/25/21 0338 07/26/21 0155  NA 139 138 138  K 3.7 3.6 3.3*  CL 111 106 109  CO2 _0 GLUCOSE 150* 109* 109*  BUN 22* 14 10  CREATININE 0.64 0.58 0.52  CALCIUM 8.9 8.6* 8.7*   Liver Function Tests: No results for input(s): "AST", "ALT", "ALKPHOS", "BILITOT", "PROT", "ALBUMIN" in the last 168 hours. CBG: Recent Labs  Lab 07/25/21 0738 07/25/21 1208 07/25/21 1709 07/25/21 2129 07/26/21 0741  GLUCAP 113* 110* 104* 112* 110*    Discharge time spent: greater than 30 minutes.  Signed: Patrecia Pour, MD [facility] 07/26/2021

## 2021-07-27 ENCOUNTER — Telehealth: Payer: Self-pay

## 2021-07-27 NOTE — Telephone Encounter (Signed)
Transition Care Management Follow-up Telephone Call Date of discharge and from where: 07/26/21 - [facility] - near syncope How have you been since you were released from the hospital? Doing better Any questions or concerns? No  Items Reviewed: Did the pt receive and understand the discharge instructions provided? Yes  Medications obtained and verified? Yes  Other? No  Any new allergies since your discharge? No  Dietary orders reviewed? Yes Do you have support at home? Yes   Home Care and Equipment/Supplies: Were home health services ordered? no  Were any new equipment or medical supplies ordered?  No  Functional Questionnaire: (I = Independent and D = Dependent) ADLs: I  Bathing/Dressing- I  Meal Prep- I  Eating- I  Maintaining continence- I  Transferring/Ambulation- I  Managing Meds- I  Follow up appointments reviewed:  PCP Hospital f/u appt confirmed? Yes  Scheduled to see DOD on call provider on 08/03/21 @ 11:50. Specialist Hospital f/u appt confirmed? No  - needs to schedule with GI, Dr Loreta Ave Are transportation arrangements needed? No  If their condition worsens, is the pt aware to call PCP or go to the Emergency Dept.? Yes Was the patient provided with contact information for the PCP's office or ED? Yes Was to pt encouraged to call back with questions or concerns? Yes

## 2021-07-27 NOTE — Telephone Encounter (Signed)
Transition Care Management Unsuccessful Follow-up Telephone Call  Date of discharge and from where:  07/26/21 - [facility]  - near syncope  Attempts:  1st Attempt  Reason for unsuccessful TCM follow-up call:  Left voice message - [place] Interpreter # [phone] LM on pt VM

## 2021-08-03 ENCOUNTER — Ambulatory Visit (INDEPENDENT_AMBULATORY_CARE_PROVIDER_SITE_OTHER): Payer: Managed Care, Other (non HMO) | Admitting: Family Medicine

## 2021-08-03 ENCOUNTER — Encounter: Payer: Self-pay | Admitting: Family Medicine

## 2021-08-03 VITALS — BP 129/70 | HR 74 | Temp 98.1°F | Ht 64.0 in | Wt 253.0 lb

## 2021-08-03 DIAGNOSIS — D649 Anemia, unspecified: Secondary | ICD-10-CM | POA: Diagnosis not present

## 2021-08-03 DIAGNOSIS — E876 Hypokalemia: Secondary | ICD-10-CM | POA: Diagnosis not present

## 2021-08-03 DIAGNOSIS — E1159 Type 2 diabetes mellitus with other circulatory complications: Secondary | ICD-10-CM | POA: Diagnosis not present

## 2021-08-03 DIAGNOSIS — R55 Syncope and collapse: Secondary | ICD-10-CM

## 2021-08-03 DIAGNOSIS — E1169 Type 2 diabetes mellitus with other specified complication: Secondary | ICD-10-CM

## 2021-08-03 DIAGNOSIS — E785 Hyperlipidemia, unspecified: Secondary | ICD-10-CM

## 2021-08-03 DIAGNOSIS — Z09 Encounter for follow-up examination after completed treatment for conditions other than malignant neoplasm: Secondary | ICD-10-CM

## 2021-08-03 DIAGNOSIS — I152 Hypertension secondary to endocrine disorders: Secondary | ICD-10-CM

## 2021-08-03 MED ORDER — METFORMIN HCL ER 500 MG PO TB24: ORAL_TABLET | ORAL | 0 refills | Status: DC

## 2021-08-03 MED ORDER — LISINOPRIL 20 MG PO TABS: 20.0000 mg | ORAL_TABLET | Freq: Every day | ORAL | 0 refills | Status: DC

## 2021-08-03 MED ORDER — ATORVASTATIN CALCIUM 80 MG PO TABS: ORAL_TABLET | ORAL | 0 refills | Status: DC

## 2021-08-03 NOTE — Progress Notes (Addendum)
   Subjective:  Patient ID: Gabriela Watkins, female    DOB: 06/15/1965, 55 y.o.   MRN: [id number]  Patient Care Team: Hawks, Christy A, FNP as PCP - General (Nurse Practitioner) Fields, Sandi L, MD (Inactive) as Consulting Physician (Gastroenterology)   Chief Complaint:  Hospitalization Follow-up   HPI: Gabriela Watkins is a 55 y.o. female presenting on 08/03/2021 for Hospitalization Follow-up   Patient presents today for hospital discharge follow-up.  She was admitted to [facility] on 07/23/2021 for symptomatic anemia and discharged home 07/26/2021.  During hospital admission she received 2 units of blood and underwent colonoscopy and EGD by GI.  She states she has been doing very well since being home.  She denies any melena or hematochezia.  No abdominal pain, nausea, vomiting, diarrhea.  No shortness of breath, chest pain, palpitations, dizziness, or syncope. Hgb 8.1, Hct 26.7, potassium 3.3 at time of discharge.  He has been taking iron repletion therapy as prescribed, denies upset stomach or constipation.  States her blood sugars have been very well controlled since being home and she does need refills on medications.  She does have follow-up with GI scheduled.    Relevant past medical, surgical, family, and social history reviewed and updated as indicated.  Allergies and medications reviewed and updated. Data reviewed: Chart in Epic.   Past Medical History:  Diagnosis Date   Anemia    High cholesterol    Hypertension    Small bowel obstruction (HCC)    hospital admission 08/24/2012   Type II diabetes mellitus (HCC)     Past Surgical History:  Procedure Laterality Date   COLONOSCOPY WITH PROPOFOL N/A 07/25/2021   Procedure: COLONOSCOPY WITH PROPOFOL;  Surgeon: Mann, Jyothi, MD;  Location: [facility];  Service: Gastroenterology;  Laterality: N/A;   ESOPHAGOGASTRODUODENOSCOPY (EGD) WITH PROPOFOL N/A 07/25/2021   Procedure: ESOPHAGOGASTRODUODENOSCOPY (EGD) WITH PROPOFOL;  Surgeon: Mann,  Jyothi, MD;  Location: [facility];  Service: Gastroenterology;  Laterality: N/A;   GIVENS CAPSULE STUDY N/A 07/25/2021   Procedure: GIVENS CAPSULE STUDY;  Surgeon: Mann, Jyothi, MD;  Location: [facility];  Service: Gastroenterology;  Laterality: N/A;   HERNIA REPAIR  ~ 01/2009; 04/2009   "stomach" (08/25/2012)   LEFT OOPHORECTOMY  ~ 2004   "tumor" (08/25/2012)   RIGHT OOPHORECTOMY  ~ 2007   "tumor; only took off a portion of the ovary" (08/25/2012)    Social History   Socioeconomic History   Marital status: Widowed    Spouse name: Not on file   Number of children: 1   Years of education: 12   Highest education level: Not on file  Occupational History   Not on file  Tobacco Use   Smoking status: Never   Smokeless tobacco: Never  Vaping Use   Vaping Use: Never used  Substance and Sexual Activity   Alcohol use: No   Drug use: No   Sexual activity: Never  Other Topics Concern   Not on file  Social History Narrative   Not on file   Social Determinants of Health   Financial Resource Strain: Not on file  Food Insecurity: Not on file  Transportation Needs: Not on file  Physical Activity: Not on file  Stress: Not on file  Social Connections: Not on file  Intimate Partner Violence: Not on file    Outpatient Encounter Medications as of 08/03/2021  Medication Sig   acetaminophen (TYLENOL) 500 MG tablet Take 1 tablet (500 mg total) by mouth every 6 (six) hours   as needed for mild pain or fever.   blood glucose meter kit and supplies KIT Dispense based on patient and insurance preference. Use up to four times daily as directed. (FOR ICD-9 250.00, 250.01).   docusate sodium (COLACE) 100 MG capsule Take 1 capsule (100 mg total) by mouth daily as needed for mild constipation.   ferrous sulfate 325 (65 FE) MG tablet Take 1 tablet (325 mg total) by mouth daily.   glucose blood (ACCU-CHEK GUIDE) test strip Test BS up to 4 times daily Dx E11.9   hydrocortisone (ANUSOL-HC) 2.5 % rectal cream  Place 1 application. rectally 2 (two) times daily. (Patient taking differently: Place 1 application  rectally daily as needed for hemorrhoids.)   ibuprofen (ADVIL) 200 MG tablet Take 400-600 mg by mouth every 6 (six) hours as needed for mild pain.   Lancets 30G MISC Check blood glucose BID and prn   phentermine 30 MG capsule Take 30 mg by mouth daily.   polyethylene glycol powder (GLYCOLAX/MIRALAX) 17 GM/SCOOP powder Take 17 g by mouth 2 (two) times daily as needed.   topiramate (TOPAMAX) 50 MG tablet Take 50 mg by mouth daily.   [DISCONTINUED] atorvastatin (LIPITOR) 80 MG tablet TAKE 1 TABLET BY MOUTH EVERY DAY (NEEDS TO BE SEEN BEFORE NEXT REFILL)   [DISCONTINUED] lisinopril (ZESTRIL) 20 MG tablet TAKE 1 TABLET BY MOUTH EVERY DAY   [DISCONTINUED] metFORMIN (GLUCOPHAGE-XR) 500 MG 24 hr tablet TAKE 1 TABLET BY MOUTH EVERY DAY WITH BREAKFAST (Patient taking differently: Take 500 mg by mouth daily with breakfast.)   atorvastatin (LIPITOR) 80 MG tablet TAKE 1 TABLET BY MOUTH EVERY DAY (NEEDS TO BE SEEN BEFORE NEXT REFILL)   lisinopril (ZESTRIL) 20 MG tablet Take 1 tablet (20 mg total) by mouth daily.   metFORMIN (GLUCOPHAGE-XR) 500 MG 24 hr tablet TAKE 1 TABLET BY MOUTH EVERY DAY WITH BREAKFAST   No facility-administered encounter medications on file as of 08/03/2021.    No Known Allergies  Review of Systems  Constitutional:  Negative for activity change, appetite change, chills, diaphoresis, fatigue, fever and unexpected weight change.  HENT: Negative.    Eyes: Negative.  Negative for photophobia and visual disturbance.  Respiratory:  Negative for cough, chest tightness and shortness of breath.   Cardiovascular:  Negative for chest pain, palpitations and leg swelling.  Gastrointestinal:  Negative for abdominal distention, abdominal pain, anal bleeding, blood in stool, constipation, diarrhea, nausea and vomiting.  Endocrine: Negative.  Negative for cold intolerance, heat intolerance, polydipsia,  polyphagia and polyuria.  Genitourinary:  Negative for decreased urine volume, difficulty urinating, dysuria, frequency and urgency.  Musculoskeletal:  Negative for arthralgias and myalgias.  Skin: Negative.   Allergic/Immunologic: Negative.   Neurological:  Negative for dizziness, weakness and headaches.  Hematological: Negative.   Psychiatric/Behavioral:  Negative for confusion, hallucinations, sleep disturbance and suicidal ideas.   All other systems reviewed and are negative.       Objective:  BP 129/70   Pulse 74   Temp 98.1 F (36.7 C)   Ht 5' 4" (1.626 m)   Wt 253 lb (114.8 kg)   SpO2 95%   BMI 43.43 kg/m    Wt Readings from Last 3 Encounters:  08/03/21 253 lb (114.8 kg)  07/25/21 245 lb 9.5 oz (111.4 kg)  05/28/21 239 lb (108.4 kg)    Physical Exam Vitals and nursing note reviewed.  Constitutional:      General: She is not in acute distress.    Appearance: Normal appearance. She   is well-developed and well-groomed. She is morbidly obese. She is not ill-appearing, toxic-appearing or diaphoretic.  HENT:     Head: Normocephalic and atraumatic.     Jaw: There is normal jaw occlusion.     Right Ear: Hearing normal.     Left Ear: Hearing normal.     Nose: Nose normal.     Mouth/Throat:     Lips: Pink.     Mouth: Mucous membranes are moist.     Pharynx: Oropharynx is clear. Uvula midline.  Eyes:     General: Lids are normal.     Extraocular Movements: Extraocular movements intact.     Conjunctiva/sclera: Conjunctivae normal.     Pupils: Pupils are equal, round, and reactive to light.  Neck:     Thyroid: No thyroid mass, thyromegaly or thyroid tenderness.     Vascular: No carotid bruit or JVD.     Trachea: Trachea and phonation normal.  Cardiovascular:     Rate and Rhythm: Normal rate and regular rhythm.     Chest Wall: PMI is not displaced.     Pulses: Normal pulses.     Heart sounds: Normal heart sounds. No murmur heard.    No friction rub. No gallop.   Pulmonary:     Effort: Pulmonary effort is normal. No respiratory distress.     Breath sounds: Normal breath sounds. No wheezing.  Abdominal:     General: Bowel sounds are normal. There is no distension or abdominal bruit.     Palpations: Abdomen is soft. There is no hepatomegaly or splenomegaly.     Tenderness: There is no abdominal tenderness. There is no right CVA tenderness or left CVA tenderness.     Hernia: No hernia is present.  Musculoskeletal:        General: Normal range of motion.     Cervical back: Normal range of motion and neck supple.     Right lower leg: No edema.     Left lower leg: No edema.  Lymphadenopathy:     Cervical: No cervical adenopathy.  Skin:    General: Skin is warm and dry.     Capillary Refill: Capillary refill takes less than 2 seconds.     Coloration: Skin is not cyanotic, jaundiced or pale.     Findings: No rash.  Neurological:     General: No focal deficit present.     Mental Status: She is alert and oriented to person, place, and time.     Sensory: Sensation is intact.     Motor: Motor function is intact.     Coordination: Coordination is intact.     Gait: Gait is intact.     Deep Tendon Reflexes: Reflexes are normal and symmetric.  Psychiatric:        Attention and Perception: Attention and perception normal.        Mood and Affect: Mood and affect normal.        Speech: Speech normal.        Behavior: Behavior normal. Behavior is cooperative.        Thought Content: Thought content normal.        Cognition and Memory: Cognition and memory normal.        Judgment: Judgment normal.     Results for orders placed or performed in visit on 08/03/21  CBC with Differential/Platelet  Result Value Ref Range   WBC 10.1 3.4 - 10.8 x10E3/uL   RBC 3.87 3.77 - 5.28 x10E6/uL   Hemoglobin 8.0 (  LL) 11.1 - 15.9 g/dL   Hematocrit 27.8 (L) 34.0 - 46.6 %   MCV 72 (L) 79 - 97 fL   MCH 20.7 (L) 26.6 - 33.0 pg   MCHC 28.8 (L) 31.5 - 35.7 g/dL   RDW  25.3 (H) 11.7 - 15.4 %   Platelets 370 150 - 450 x10E3/uL   Neutrophils 59 Not Estab. %   Lymphs 31 Not Estab. %   Monocytes 7 Not Estab. %   Eos 2 Not Estab. %   Basos 1 Not Estab. %   Neutrophils Absolute 6.0 1.4 - 7.0 x10E3/uL   Lymphocytes Absolute 3.1 0.7 - 3.1 x10E3/uL   Monocytes Absolute 0.7 0.1 - 0.9 x10E3/uL   EOS (ABSOLUTE) 0.2 0.0 - 0.4 x10E3/uL   Basophils Absolute 0.1 0.0 - 0.2 x10E3/uL   Immature Granulocytes 0 Not Estab. %   Immature Grans (Abs) 0.0 0.0 - 0.1 x10E3/uL   Hematology Comments: Note:   Iron, TIBC and Ferritin Panel  Result Value Ref Range   Total Iron Binding Capacity 491 (H) 250 - 450 ug/dL   UIBC 219 131 - 425 ug/dL   Iron 272 (HH) 27 - 159 ug/dL   Iron Saturation 55 15 - 55 %   Ferritin 12 (L) 15 - 150 ng/mL  CMP14+EGFR  Result Value Ref Range   Glucose 106 (H) 70 - 99 mg/dL   BUN 20 6 - 24 mg/dL   Creatinine, Ser 0.68 0.57 - 1.00 mg/dL   eGFR 104 >59 mL/min/1.73   BUN/Creatinine Ratio 29 (H) 9 - 23   Sodium 140 134 - 144 mmol/L   Potassium 4.5 3.5 - 5.2 mmol/L   Chloride 104 96 - 106 mmol/L   CO2 22 20 - 29 mmol/L   Calcium 9.0 8.7 - 10.2 mg/dL   Total Protein 6.4 6.0 - 8.5 g/dL   Albumin 4.0 3.8 - 4.9 g/dL   Globulin, Total 2.4 1.5 - 4.5 g/dL   Albumin/Globulin Ratio 1.7 1.2 - 2.2   Bilirubin Total <0.2 0.0 - 1.2 mg/dL   Alkaline Phosphatase 150 (H) 44 - 121 IU/L   AST 12 0 - 40 IU/L   ALT 17 0 - 32 IU/L       Pertinent labs & imaging results that were available during my care of the patient were reviewed by me and considered in my medical decision making.  Assessment & Plan:  Diagnoses and all orders for this visit:  Hospital discharge follow-up Today's visit was for Transitional Care Management.  The patient was discharged from [facility] on 07/26/2021 with a primary diagnosis of symptomatic anemia.   Contact with the patient and/or caregiver, by a clinical staff member, was made on 07/27/2021 and was documented as a telephone  encounter within the EMR.  Through chart review and discussion with the patient I have determined that management of their condition is of high complexity.   Symptomatic anemia Will repeat labs today. Continue iron therapy.  -     CBC with Differential/Platelet -     Iron, TIBC and Ferritin Panel  Near syncope Will repeat labs today. -     CBC with Differential/Platelet -     Iron, TIBC and Ferritin Panel  Hypokalemia Repeat labs today and start repletion if warranted.  -     CMP14+EGFR  Hypertension associated with diabetes (HCC) Refill medications today, patient aware to follow-up PCP in 3 months. -     CBC with Differential/Platelet -       lisinopril (ZESTRIL) 20 MG tablet; Take 1 tablet (20 mg total) by mouth daily.  Type 2 diabetes mellitus with other specified complication, without long-term current use of insulin (HCC) Refill medications today, patient aware to follow-up with PCP in 3 months. -     metFORMIN (GLUCOPHAGE-XR) 500 MG 24 hr tablet; TAKE 1 TABLET BY MOUTH EVERY DAY WITH BREAKFAST  Hyperlipidemia associated with type 2 diabetes mellitus (HCC) Refill medications today, patient aware to follow-up with PCP in 3 months. -     atorvastatin (LIPITOR) 80 MG tablet; TAKE 1 TABLET BY MOUTH EVERY DAY (NEEDS TO BE SEEN BEFORE NEXT REFILL)     Continue all other maintenance medications.  Follow up plan: Return in about 3 months (around 11/03/2021), or if symptoms worsen or fail to improve, for with PCP for DM.   Continue healthy lifestyle choices, including diet (rich in fruits, vegetables, and lean proteins, and low in salt and simple carbohydrates) and exercise (at least 30 minutes of moderate physical activity daily).   The above assessment and management plan was discussed with the patient. The patient verbalized understanding of and has agreed to the management plan. Patient is aware to call the clinic if they develop any new symptoms or if symptoms persist or  worsen. Patient is aware when to return to the clinic for a follow-up visit. Patient educated on when it is appropriate to go to the emergency department.   Monia Pouch, FNP-C [facility] [phone]

## 2021-08-04 LAB — CBC WITH DIFFERENTIAL/PLATELET
Basophils Absolute: 0.1 10*3/uL (ref 0.0–0.2)
Basos: 1 %
EOS (ABSOLUTE): 0.2 10*3/uL (ref 0.0–0.4)
Eos: 2 %
Hematocrit: 27.8 % — ABNORMAL LOW (ref 34.0–46.6)
Hemoglobin: 8 g/dL — CL (ref 11.1–15.9)
Immature Grans (Abs): 0 10*3/uL (ref 0.0–0.1)
Immature Granulocytes: 0 %
Lymphocytes Absolute: 3.1 10*3/uL (ref 0.7–3.1)
Lymphs: 31 %
MCH: 20.7 pg — ABNORMAL LOW (ref 26.6–33.0)
MCHC: 28.8 g/dL — ABNORMAL LOW (ref 31.5–35.7)
MCV: 72 fL — ABNORMAL LOW (ref 79–97)
Monocytes Absolute: 0.7 10*3/uL (ref 0.1–0.9)
Monocytes: 7 %
Neutrophils Absolute: 6 10*3/uL (ref 1.4–7.0)
Neutrophils: 59 %
Platelets: 370 10*3/uL (ref 150–450)
RBC: 3.87 x10E6/uL (ref 3.77–5.28)
RDW: 25.3 % — ABNORMAL HIGH (ref 11.7–15.4)
WBC: 10.1 10*3/uL (ref 3.4–10.8)

## 2021-08-04 LAB — CMP14+EGFR
ALT: 17 IU/L (ref 0–32)
AST: 12 IU/L (ref 0–40)
Albumin/Globulin Ratio: 1.7 (ref 1.2–2.2)
Albumin: 4 g/dL (ref 3.8–4.9)
Alkaline Phosphatase: 150 IU/L — ABNORMAL HIGH (ref 44–121)
BUN/Creatinine Ratio: 29 — ABNORMAL HIGH (ref 9–23)
BUN: 20 mg/dL (ref 6–24)
Bilirubin Total: <0.2 mg/dL (ref 0.0–1.2)
CO2: 22 mmol/L (ref 20–29)
Calcium: 9 mg/dL (ref 8.7–10.2)
Chloride: 104 mmol/L (ref 96–106)
Creatinine, Ser: 0.68 mg/dL (ref 0.57–1.00)
Globulin, Total: 2.4 g/dL (ref 1.5–4.5)
Glucose: 106 mg/dL — ABNORMAL HIGH (ref 70–99)
Potassium: 4.5 mmol/L (ref 3.5–5.2)
Sodium: 140 mmol/L (ref 134–144)
Total Protein: 6.4 g/dL (ref 6.0–8.5)
eGFR: 104 mL/min/{1.73_m2} (ref >59)

## 2021-08-04 LAB — IRON,TIBC AND FERRITIN PANEL
Ferritin: 12 ng/mL — ABNORMAL LOW (ref 15–150)
Iron Saturation: 55 % (ref 15–55)
Iron: 272 ug/dL (ref 27–159)
Total Iron Binding Capacity: 491 ug/dL — ABNORMAL HIGH (ref 250–450)
UIBC: 219 ug/dL (ref 131–425)

## 2021-08-06 ENCOUNTER — Telehealth: Payer: Self-pay | Admitting: Emergency Medicine

## 2021-08-06 ENCOUNTER — Other Ambulatory Visit: Payer: Self-pay | Admitting: Nurse Practitioner

## 2021-08-06 DIAGNOSIS — D649 Anemia, unspecified: Secondary | ICD-10-CM

## 2021-08-06 NOTE — Telephone Encounter (Signed)
Have patient return to repeat HGB, not low enough for transfusion which is a 7  Lab orders placed.  thanks

## 2021-08-06 NOTE — Telephone Encounter (Signed)
Pt rc for lab results.

## 2021-08-06 NOTE — Telephone Encounter (Signed)
Telephone Fax sent from over the week On call Service. Critical Lab for Patient. Hemoglobin of 8.0. Iron of 272 ug/dL  Covering for PCP

## 2021-08-07 ENCOUNTER — Other Ambulatory Visit: Payer: Self-pay

## 2021-08-07 ENCOUNTER — Other Ambulatory Visit: Payer: Managed Care, Other (non HMO)

## 2021-08-07 DIAGNOSIS — D649 Anemia, unspecified: Secondary | ICD-10-CM

## 2021-08-07 NOTE — Telephone Encounter (Signed)
Patient aware.   Coming back in for repeat labs today 06/20

## 2021-08-08 ENCOUNTER — Telehealth: Payer: Self-pay | Admitting: Family

## 2021-08-08 LAB — CBC WITH DIFFERENTIAL/PLATELET
Basophils Absolute: 0.1 10*3/uL (ref 0.0–0.2)
Basos: 1 %
EOS (ABSOLUTE): 0.2 10*3/uL (ref 0.0–0.4)
Eos: 2 %
Hematocrit: 28.3 % — ABNORMAL LOW (ref 34.0–46.6)
Hemoglobin: 8.5 g/dL — CL (ref 11.1–15.9)
Immature Grans (Abs): 0 10*3/uL (ref 0.0–0.1)
Immature Granulocytes: 0 %
Lymphocytes Absolute: 2.7 10*3/uL (ref 0.7–3.1)
Lymphs: 33 %
MCH: 21.2 pg — ABNORMAL LOW (ref 26.6–33.0)
MCHC: 30 g/dL — ABNORMAL LOW (ref 31.5–35.7)
MCV: 71 fL — ABNORMAL LOW (ref 79–97)
Monocytes Absolute: 0.5 10*3/uL (ref 0.1–0.9)
Monocytes: 6 %
Neutrophils Absolute: 4.7 10*3/uL (ref 1.4–7.0)
Neutrophils: 58 %
Platelets: 433 10*3/uL (ref 150–450)
RBC: 4.01 x10E6/uL (ref 3.77–5.28)
RDW: 27.9 % — ABNORMAL HIGH (ref 11.7–15.4)
WBC: 8.1 10*3/uL (ref 3.4–10.8)

## 2021-08-08 NOTE — Telephone Encounter (Signed)
Shelly called from [employer] to give critical lab result:  Hgb 8.5

## 2021-08-22 ENCOUNTER — Telehealth: Payer: Self-pay | Admitting: Family

## 2021-08-22 MED ORDER — FERROUS SULFATE 325 (65 FE) MG PO TABS: 325.0000 mg | ORAL_TABLET | Freq: Every day | ORAL | 0 refills | Status: DC

## 2021-08-22 NOTE — Addendum Note (Signed)
Addended by: Sonny Masters on: 08/22/2021 04:30 PM   Modules accepted: Orders

## 2021-08-22 NOTE — Telephone Encounter (Signed)
  Prescription Request  08/22/2021  Is this a "Controlled Substance" medicine? no  Have you seen your PCP in the last 2 weeks? Pt appt with PCP on 09/21/2021  If YES, route message to pool  -  If NO, patient needs to be scheduled for appointment.  What is the name of the medication or equipment? ferrous sulfate 325 (65 FE) MG tablet this prescription was prescribed when pt was in hospital, has been seen here for hospital f/u   Have you contacted your pharmacy to request a refill? yes   Which pharmacy would you like this sent to? [facility] [place]   Patient notified that their request is being sent to the clinical staff for review and that they should receive a response within 2 business days.

## 2021-08-22 NOTE — Telephone Encounter (Signed)
LOV: 08/03/2021

## 2021-08-29 ENCOUNTER — Other Ambulatory Visit: Payer: Self-pay | Admitting: Family Medicine

## 2021-08-29 DIAGNOSIS — E1169 Type 2 diabetes mellitus with other specified complication: Secondary | ICD-10-CM

## 2021-09-19 ENCOUNTER — Other Ambulatory Visit: Payer: Self-pay | Admitting: Family Medicine

## 2021-09-19 DIAGNOSIS — D649 Anemia, unspecified: Secondary | ICD-10-CM

## 2021-09-21 ENCOUNTER — Encounter: Payer: Self-pay | Admitting: Family

## 2021-09-21 ENCOUNTER — Ambulatory Visit: Payer: Managed Care, Other (non HMO) | Admitting: Family

## 2021-09-21 VITALS — BP 125/74 | HR 71 | Temp 98.2°F | Ht 64.0 in | Wt 270.4 lb

## 2021-09-21 DIAGNOSIS — E1159 Type 2 diabetes mellitus with other circulatory complications: Secondary | ICD-10-CM | POA: Diagnosis not present

## 2021-09-21 DIAGNOSIS — D509 Iron deficiency anemia, unspecified: Secondary | ICD-10-CM | POA: Diagnosis not present

## 2021-09-21 DIAGNOSIS — Z0000 Encounter for general adult medical examination without abnormal findings: Secondary | ICD-10-CM

## 2021-09-21 DIAGNOSIS — E785 Hyperlipidemia, unspecified: Secondary | ICD-10-CM

## 2021-09-21 DIAGNOSIS — E559 Vitamin D deficiency, unspecified: Secondary | ICD-10-CM

## 2021-09-21 DIAGNOSIS — E1169 Type 2 diabetes mellitus with other specified complication: Secondary | ICD-10-CM

## 2021-09-21 DIAGNOSIS — B351 Tinea unguium: Secondary | ICD-10-CM

## 2021-09-21 DIAGNOSIS — Z0001 Encounter for general adult medical examination with abnormal findings: Secondary | ICD-10-CM | POA: Diagnosis not present

## 2021-09-21 DIAGNOSIS — D649 Anemia, unspecified: Secondary | ICD-10-CM

## 2021-09-21 DIAGNOSIS — I152 Hypertension secondary to endocrine disorders: Secondary | ICD-10-CM

## 2021-09-21 LAB — BAYER DCA HB A1C WAIVED: HB A1C (BAYER DCA - WAIVED): 6 % — ABNORMAL HIGH (ref 4.8–5.6)

## 2021-09-21 MED ORDER — FERROUS SULFATE 325 (65 FE) MG PO TABS: 325.0000 mg | ORAL_TABLET | Freq: Every day | ORAL | 1 refills | Status: DC

## 2021-09-21 MED ORDER — TERBINAFINE HCL 250 MG PO TABS: 250.0000 mg | ORAL_TABLET | Freq: Every day | ORAL | 0 refills | Status: DC

## 2021-09-21 NOTE — Progress Notes (Signed)
Subjective:    Patient ID: Gabriela Watkins, female    DOB: 08-27-1965, 56 y.o.   MRN: [id number]  Chief Complaint  Patient presents with   Anemia   Pt presents to the office today for CPE without pap. Complaining of fungus on middle of left hand that she noticed 5 months ago.  Diabetes She presents for her follow-up diabetic visit. She has type 2 diabetes mellitus. Pertinent negatives for diabetes include no blurred vision and no foot paresthesias. Symptoms are stable. Risk factors for coronary artery disease include diabetes mellitus, dyslipidemia, hypertension and sedentary lifestyle. She is following a generally unhealthy diet. Her overall blood glucose range is 110-130 mg/dl. Eye exam is current.  Hypertension This is a chronic problem. The current episode started more than 1 year ago. The problem has been resolved since onset. The problem is controlled. Pertinent negatives include no blurred vision, malaise/fatigue, peripheral edema or shortness of breath. Risk factors for coronary artery disease include dyslipidemia, diabetes mellitus, obesity and sedentary lifestyle. The current treatment provides moderate improvement. There is no history of heart failure.  Hyperlipidemia This is a chronic problem. The current episode started more than 1 year ago. The problem is controlled. Recent lipid tests were reviewed and are normal. Exacerbating diseases include obesity. Pertinent negatives include no shortness of breath. Current antihyperlipidemic treatment includes statins. The current treatment provides moderate improvement of lipids. Risk factors for coronary artery disease include dyslipidemia, diabetes mellitus, hypertension and a sedentary lifestyle.  Anemia Presents for follow-up visit. There has been no fever or malaise/fatigue. There is no history of heart failure. Side effects of medications include fatigue.      Review of Systems  Constitutional:  Negative for fever and malaise/fatigue.   Eyes:  Negative for blurred vision.  Respiratory:  Negative for shortness of breath.   All other systems reviewed and are negative.  Family History  Problem Relation Age of Onset   Diabetes Mother    Social History   Socioeconomic History   Marital status: Widowed    Spouse name: Not on file   Number of children: 1   Years of education: 19   Highest education level: Not on file  Occupational History   Not on file  Tobacco Use   Smoking status: Never   Smokeless tobacco: Never  Vaping Use   Vaping Use: Never used  Substance and Sexual Activity   Alcohol use: No   Drug use: No   Sexual activity: Never  Other Topics Concern   Not on file  Social History Narrative   Not on file   Social Determinants of Health   Financial Resource Strain: Not on file  Food Insecurity: Not on file  Transportation Needs: Not on file  Physical Activity: Not on file  Stress: Not on file  Social Connections: Not on file       Objective:   Physical Exam Vitals reviewed.  Constitutional:      General: She is not in acute distress.    Appearance: She is well-developed. She is obese.  HENT:     Head: Normocephalic and atraumatic.     Right Ear: Tympanic membrane normal.     Left Ear: Tympanic membrane normal.  Eyes:     Pupils: Pupils are equal, round, and reactive to light.  Neck:     Thyroid: No thyromegaly.  Cardiovascular:     Rate and Rhythm: Normal rate and regular rhythm.     Heart sounds: Normal heart sounds.  No murmur heard. Pulmonary:     Effort: Pulmonary effort is normal. No respiratory distress.     Breath sounds: Normal breath sounds. No wheezing.  Abdominal:     General: Bowel sounds are normal. There is no distension.     Palpations: Abdomen is soft.     Tenderness: There is no abdominal tenderness.  Musculoskeletal:        General: No tenderness. Normal range of motion.     Cervical back: Normal range of motion and neck supple.  Skin:    General: Skin is  warm and dry.  Neurological:     Mental Status: She is alert and oriented to person, place, and time.     Cranial Nerves: No cranial nerve deficit.     Deep Tendon Reflexes: Reflexes are normal and symmetric.  Psychiatric:        Behavior: Behavior normal.        Thought Content: Thought content normal.        Judgment: Judgment normal.       BP 125/74   Pulse 71   Temp 98.2 F (36.8 C) (Temporal)   Ht $R'5\' 4"'Jv$  (1.626 m)   Wt 270 lb 6.4 oz (122.7 kg)   SpO2 96%   BMI 46.41 kg/m      Assessment & Plan:  Gabriela Watkins comes in today with chief complaint of Annual Exam   Diagnosis and orders addressed:  1. Annual physical exam - CMP14+EGFR - Bayer DCA Hb A1c Waived - Lipid panel - TSH - Microalbumin / creatinine urine ratio - Anemia Profile B  2. Hypertension associated with diabetes ([place]) - CMP14+EGFR  3. Type 2 diabetes mellitus with other specified complication, without long-term current use of insulin (HCC) - CMP14+EGFR - Bayer DCA Hb A1c Waived - Microalbumin / creatinine urine ratio  4. Hyperlipidemia associated with type 2 diabetes mellitus (HCC) - CMP14+EGFR  5. Iron deficiency anemia, unspecified iron deficiency anemia type - CMP14+EGFR - Anemia Profile B - terbinafine (LAMISIL) 250 MG tablet; Take 1 tablet (250 mg total) by mouth daily.  Dispense: 90 tablet; Refill: 0  6. Obesity, morbid (HCC) - CMP14+EGFR  7. Vitamin D deficiency - CMP14+EGFR  8. Onychomycosis -terbinafine 250 mg daily for 3 months  9. Symptomatic anemia - ferrous sulfate 325 (65 FE) MG tablet; Take 1 tablet (325 mg total) by mouth daily.  Dispense: 90 tablet; Refill: 1   Labs pending Health Maintenance reviewed Diet and exercise encouraged  Follow up plan: 4 months    I have reviewed and agree with the above  documentation.   Evelina Dun, FNP

## 2021-09-21 NOTE — Patient Instructions (Addendum)
Anemia por deficiencia de hierro en los adultos Iron Deficiency Anemia, Adult  La anemia por deficiencia de hierro es una afeccin en la que la concentracin de glbulos rojos o hemoglobina en la sangre est por debajo de lo normal debido a la falta de hierro. La hemoglobina es la sustancia de los glbulos rojos que lleva el oxgeno a todos los tejidos del cuerpo. Cuando la concentracin de los glbulos rojos o de la hemoglobina es demasiado baja, no llega suficiente oxgeno a estos tejidos. La anemia por deficiencia de hierro, generalmente, es de larga duracin y se desarrolla con Museum/gallery conservator. Puede o no causar sntomas. Este es un tipo comn de anemia. Cules son las causas? Esta afeccin puede ser causada por lo siguiente: Insuficiencia de Architectural technologist. Absorcin intestinal anormal. Prdida de sangre. Qu incrementa el riesgo? Es ms probable que desarrolle esta afeccin si tiene perodos menstruales (menstra) o est embarazada. Cules son los signos o sntomas? Los sntomas de esta afeccin pueden incluir los siguientes: Piel, labios y uas plidos. Debilidad, mareos y cansarse con facilidad. Falta de aire al [employer] o al hacer ejercicio. Manos o pies fros. Los casos leves de anemia podran no causar sntomas. Cmo se diagnostica? Esta afeccin se diagnostica en funcin de lo siguiente: Sus antecedentes mdicos. Un examen fsico. Anlisis de sangre. Cmo se trata? Esta afeccin se trata remediando la causa de la deficiencia de hierro. El tratamiento puede implicar lo siguiente: Agregarle a la dieta alimentos ricos en hierro. Tomar suplementos de hierro. Si est embarazada o amamantando, es posible que deba tomar una dosis adicional de hierro, ya que su dieta normal generalmente no proporciona la cantidad necesaria. Aumentar la ingesta de vitamina C. La vitamina C ayuda al organismo a Set designer. Es posible que el mdico le recomiende tomar los suplementos de  hierro con un vaso de jugo de naranja o con un suplemento de vitamina C. Medicamentos para disminuir el flujo menstrual abundante. Ciruga o procedimientos de prueba adicionales para determinar la causa de la anemia. Es posible que deba realizarse varios anlisis de sangre para determinar si el tratamiento da resultado. Si el tratamiento no funciona, es posible que deba realizarse [address] Siga estas instrucciones en su casa: Medicamentos Use los medicamentos de venta libre y los recetados solamente como se lo haya indicado el mdico. Esto incluye suplementos de hierro y vitaminas. Esto es importante, ya que un exceso de hierro puede ser perjudicial. Para lograr una mejor absorcin del hierro, los suplementos de hierro se deben tomar con Investment banker, corporate vaco. Si no los tolera con el estmago vaco, posiblemente deba tomarlos con la comida. No beba leche ni tome anticidos al [employer] suplementos de hierro. La leche y los anticidos pueden interferir en la manera en que el organismo absorbe el hierro. Los suplementos de hierro pueden hacer que la materia fecal (heces) se torne de un color ms oscuro y puede ponerse negra. Si no tolera tomar los suplementos de hierro por boca, hable con su mdico sobre la posibilidad de administrarlos por una va intravenosa o mediante una inyeccin en un msculo. Comida y bebida Hable con el mdico antes de cambiar la dieta. El mdico podra recomendarle que consuma alimentos que contienen mucho hierro; por ejemplo: Hgado. Carne de res con bajo contenido de grasa [employer]). Panes y cereales con hierro agregado (fortificados). Huevos. Frutas pasas. Verduras de hojas color verde oscuro. Para ayudar al organismo a aprovechar el hierro de los alimentos con Tax adviser  contenido de hierro, consuma dichos alimentos al [employer] tiempo que frutas y verduras frescas que tengan mucha vitamina C. Algunos alimentos ricos en vitamina C son los  siguientes: Naranjas. Pimientos. Tomates. Mangos. Control del estreimiento Si toma un suplemento de hierro, este puede causarle estreimiento. Para prevenir o tratar el estreimiento, es posible que deba hacer lo siguiente: Beber suficiente lquido como para Pharmacologist la orina de color amarillo plido. Usar medicamentos recetados o de Sales promotion account executive. Consumir alimentos ricos en fibra, como frijoles, cereales integrales, y frutas y verduras frescas. Limitar el consumo de alimentos ricos en grasa y azcares procesados, como los alimentos fritos o dulces. Instrucciones generales [place] sus actividades normales segn lo indicado por el mdico. Pregntele al mdico qu actividades son seguras para usted. Concurra a todas las visitas de seguimiento. Comunquese con un mdico si: Siente nuseas o vomita. Se siente dbil. Se siente mareado cuando se para despus de estar sentado o acostado. Comienza a sudar sin motivo. Presenta sntomas de estreimiento. Siente pesadez en el pecho. Tiene dificultad para respirar cuando hace actividad fsica. Solicite ayuda de inmediato si: Se desmaya. Si esto sucede, no conduzca por sus propios [employer]. Tiene latidos cardacos irregulares o rpidos. Resumen La anemia por deficiencia de hierro es Insurance claims handler en la que la concentracin de glbulos rojos o hemoglobina en la sangre est por debajo de lo normal debido a la falta de hierro. Esta afeccin se trata remediando la causa de la deficiencia de hierro. Use los medicamentos de venta libre y los recetados solamente como se lo haya indicado el mdico. Esto incluye suplementos de hierro y vitaminas. Para ayudar al organismo a aprovechar el hierro de los alimentos con alto contenido de hierro, consuma dichos alimentos al mismo tiempo que frutas y verduras frescas que tengan mucha vitamina C. [place] ayuda mdica si tiene signos o sntomas de empeoramiento de la anemia. Esta informacin no tiene  Theme park manager el consejo del mdico. Asegrese de hacerle al mdico cualquier pregunta que tenga. Document Revised: 03/28/2021 Document Reviewed: 03/28/2021 Elsevier Patient Education  2023 [name].

## 2021-09-22 LAB — ANEMIA PROFILE B
Basophils Absolute: 0.1 10*3/uL (ref 0.0–0.2)
Basos: 1 %
EOS (ABSOLUTE): 0.1 10*3/uL (ref 0.0–0.4)
Eos: 1 %
Ferritin: 21 ng/mL (ref 15–150)
Folate: >20 ng/mL (ref >3.0)
Hematocrit: 35.4 % (ref 34.0–46.6)
Hemoglobin: 11.6 g/dL (ref 11.1–15.9)
Immature Grans (Abs): 0 10*3/uL (ref 0.0–0.1)
Immature Granulocytes: 0 %
Iron Saturation: 24 % (ref 15–55)
Iron: 121 ug/dL (ref 27–159)
Lymphocytes Absolute: 2.5 10*3/uL (ref 0.7–3.1)
Lymphs: 24 %
MCH: 26.1 pg — ABNORMAL LOW (ref 26.6–33.0)
MCHC: 32.8 g/dL (ref 31.5–35.7)
MCV: 80 fL (ref 79–97)
Monocytes Absolute: 0.5 10*3/uL (ref 0.1–0.9)
Monocytes: 5 %
Neutrophils Absolute: 7.3 10*3/uL — ABNORMAL HIGH (ref 1.4–7.0)
Neutrophils: 69 %
Platelets: 293 10*3/uL (ref 150–450)
RBC: 4.44 x10E6/uL (ref 3.77–5.28)
Retic Ct Pct: 1.4 % (ref 0.6–2.6)
Total Iron Binding Capacity: 506 ug/dL — ABNORMAL HIGH (ref 250–450)
UIBC: 385 ug/dL (ref 131–425)
Vitamin B-12: 457 pg/mL (ref 232–1245)
WBC: 10.5 10*3/uL (ref 3.4–10.8)

## 2021-09-22 LAB — CMP14+EGFR
ALT: 28 IU/L (ref 0–32)
AST: 20 IU/L (ref 0–40)
Albumin/Globulin Ratio: 1.7 (ref 1.2–2.2)
Albumin: 4.6 g/dL (ref 3.8–4.9)
Alkaline Phosphatase: 156 IU/L — ABNORMAL HIGH (ref 44–121)
BUN/Creatinine Ratio: 32 — ABNORMAL HIGH (ref 9–23)
BUN: 20 mg/dL (ref 6–24)
Bilirubin Total: <0.2 mg/dL (ref 0.0–1.2)
CO2: 23 mmol/L (ref 20–29)
Calcium: 9.4 mg/dL (ref 8.7–10.2)
Chloride: 100 mmol/L (ref 96–106)
Creatinine, Ser: 0.62 mg/dL (ref 0.57–1.00)
Globulin, Total: 2.7 g/dL (ref 1.5–4.5)
Glucose: 120 mg/dL — ABNORMAL HIGH (ref 70–99)
Potassium: 4.2 mmol/L (ref 3.5–5.2)
Sodium: 140 mmol/L (ref 134–144)
Total Protein: 7.3 g/dL (ref 6.0–8.5)
eGFR: 106 mL/min/{1.73_m2} (ref >59)

## 2021-09-22 LAB — LIPID PANEL
Chol/HDL Ratio: 3 ratio (ref 0.0–4.4)
Cholesterol, Total: 188 mg/dL (ref 100–199)
HDL: 63 mg/dL (ref >39)
LDL Chol Calc (NIH): 109 mg/dL — ABNORMAL HIGH (ref 0–99)
Triglycerides: 89 mg/dL (ref 0–149)
VLDL Cholesterol Cal: 16 mg/dL (ref 5–40)

## 2021-09-22 LAB — TSH: TSH: 1.51 u[IU]/mL (ref 0.450–4.500)

## 2021-09-22 LAB — MICROALBUMIN / CREATININE URINE RATIO
Creatinine, Urine: 54.1 mg/dL
Microalb/Creat Ratio: 6 mg/g creat (ref 0–29)
Microalbumin, Urine: 3.3 ug/mL

## 2021-09-26 ENCOUNTER — Other Ambulatory Visit: Payer: Self-pay | Admitting: Family

## 2021-09-26 DIAGNOSIS — E1169 Type 2 diabetes mellitus with other specified complication: Secondary | ICD-10-CM

## 2021-11-02 ENCOUNTER — Other Ambulatory Visit: Payer: Self-pay | Admitting: Family Medicine

## 2021-11-02 DIAGNOSIS — E1169 Type 2 diabetes mellitus with other specified complication: Secondary | ICD-10-CM

## 2021-11-02 DIAGNOSIS — I152 Hypertension secondary to endocrine disorders: Secondary | ICD-10-CM

## 2021-11-05 ENCOUNTER — Ambulatory Visit: Payer: Managed Care, Other (non HMO) | Admitting: Family

## 2021-11-16 ENCOUNTER — Ambulatory Visit: Payer: Managed Care, Other (non HMO) | Admitting: Family

## 2021-11-30 ENCOUNTER — Encounter: Payer: Self-pay | Admitting: Family

## 2021-11-30 ENCOUNTER — Ambulatory Visit: Payer: Managed Care, Other (non HMO) | Admitting: Family

## 2021-11-30 VITALS — BP 120/67 | HR 75 | Temp 98.0°F | Ht 64.0 in | Wt 284.4 lb

## 2021-11-30 DIAGNOSIS — E1159 Type 2 diabetes mellitus with other circulatory complications: Secondary | ICD-10-CM | POA: Diagnosis not present

## 2021-11-30 DIAGNOSIS — I152 Hypertension secondary to endocrine disorders: Secondary | ICD-10-CM

## 2021-11-30 DIAGNOSIS — Z23 Encounter for immunization: Secondary | ICD-10-CM | POA: Diagnosis not present

## 2021-11-30 DIAGNOSIS — D649 Anemia, unspecified: Secondary | ICD-10-CM | POA: Diagnosis not present

## 2021-11-30 DIAGNOSIS — E559 Vitamin D deficiency, unspecified: Secondary | ICD-10-CM

## 2021-11-30 DIAGNOSIS — E1169 Type 2 diabetes mellitus with other specified complication: Secondary | ICD-10-CM | POA: Diagnosis not present

## 2021-11-30 DIAGNOSIS — D509 Iron deficiency anemia, unspecified: Secondary | ICD-10-CM

## 2021-11-30 DIAGNOSIS — E785 Hyperlipidemia, unspecified: Secondary | ICD-10-CM

## 2021-11-30 LAB — BAYER DCA HB A1C WAIVED: HB A1C (BAYER DCA - WAIVED): 7.2 % — ABNORMAL HIGH (ref 4.8–5.6)

## 2021-11-30 MED ORDER — FERROUS SULFATE 325 (65 FE) MG PO TABS: 325.0000 mg | ORAL_TABLET | Freq: Every day | ORAL | 1 refills | Status: AC

## 2021-11-30 MED ORDER — LISINOPRIL 20 MG PO TABS: 20.0000 mg | ORAL_TABLET | Freq: Every day | ORAL | 0 refills | Status: AC

## 2021-11-30 MED ORDER — ATORVASTATIN CALCIUM 80 MG PO TABS: 80.0000 mg | ORAL_TABLET | Freq: Every day | ORAL | 0 refills | Status: AC

## 2021-11-30 MED ORDER — METFORMIN HCL ER 500 MG PO TB24: ORAL_TABLET | ORAL | 1 refills | Status: AC

## 2021-11-30 MED ORDER — TERBINAFINE HCL 250 MG PO TABS: 250.0000 mg | ORAL_TABLET | Freq: Every day | ORAL | 0 refills | Status: AC

## 2021-11-30 NOTE — Patient Instructions (Signed)

## 2021-11-30 NOTE — Progress Notes (Signed)
Subjective:    Patient ID: Gabriela Watkins, female    DOB: Feb 06, 1966, 56 y.o.   MRN: [id number]  Chief Complaint  Patient presents with   Medical Management of Chronic Issues    Wants anemia checked    Pt presents to the office today for chronic follow up. Complaining of fungus on middle of left hand that she noticed 5 months ago.  Hypertension This is a chronic problem. The current episode started more than 1 year ago. The problem has been resolved since onset. The problem is controlled. Pertinent negatives include no blurred vision, malaise/fatigue, peripheral edema or shortness of breath. Risk factors for coronary artery disease include dyslipidemia, obesity and sedentary lifestyle. The current treatment provides moderate improvement.  Diabetes She presents for her follow-up diabetic visit. She has type 2 diabetes mellitus. Pertinent negatives for diabetes include no blurred vision and no foot paresthesias. Symptoms are stable. Risk factors for coronary artery disease include dyslipidemia, diabetes mellitus, hypertension, sedentary lifestyle and post-menopausal. She is following a generally healthy diet. Her overall blood glucose range is 110-130 mg/dl. Eye exam is not current.  Hyperlipidemia This is a chronic problem. The current episode started more than 1 year ago. The problem is controlled. Recent lipid tests were reviewed and are normal. Exacerbating diseases include obesity. Pertinent negatives include no shortness of breath. Current antihyperlipidemic treatment includes statins. The current treatment provides moderate improvement of lipids. Risk factors for coronary artery disease include dyslipidemia, diabetes mellitus, hypertension, a sedentary lifestyle and post-menopausal.  Anemia Presents for follow-up visit. There has been no bruising/bleeding easily, leg swelling or malaise/fatigue.      Review of Systems  Constitutional:  Negative for malaise/fatigue.  Eyes:  Negative for  blurred vision.  Respiratory:  Negative for shortness of breath.   Hematological:  Does not bruise/bleed easily.  All other systems reviewed and are negative.      Objective:   Physical Exam Vitals reviewed.  Constitutional:      General: She is not in acute distress.    Appearance: She is well-developed. She is obese.  HENT:     Head: Normocephalic and atraumatic.     Right Ear: Tympanic membrane normal.     Left Ear: Tympanic membrane normal.  Eyes:     Pupils: Pupils are equal, round, and reactive to light.  Neck:     Thyroid: No thyromegaly.  Cardiovascular:     Rate and Rhythm: Normal rate and regular rhythm.     Heart sounds: Normal heart sounds. No murmur heard. Pulmonary:     Effort: Pulmonary effort is normal. No respiratory distress.     Breath sounds: Normal breath sounds. No wheezing.  Abdominal:     General: Bowel sounds are normal. There is no distension.     Palpations: Abdomen is soft.     Tenderness: There is no abdominal tenderness.  Musculoskeletal:        General: No tenderness. Normal range of motion.     Cervical back: Normal range of motion and neck supple.  Skin:    General: Skin is warm and dry.  Neurological:     Mental Status: She is alert and oriented to person, place, and time.     Cranial Nerves: No cranial nerve deficit.     Deep Tendon Reflexes: Reflexes are normal and symmetric.  Psychiatric:        Behavior: Behavior normal.        Thought Content: Thought content normal.  Judgment: Judgment normal.       BP 120/67   Pulse 75   Temp 98 F (36.7 C) (Temporal)   Ht _0  (1.626 m)   Wt 284 lb 6.4 oz (129 kg)   SpO2 96%   BMI 48.82 kg/m      Assessment & Plan:   BRISIA SCHUERMANN comes in today with chief complaint of Medical Management of Chronic Issues (Wants anemia checked )   Diagnosis and orders addressed:  1. Hypertension associated with diabetes ([place]) - lisinopril (ZESTRIL) 20 MG tablet; Take 1 tablet (20 mg  total) by mouth daily.  Dispense: 90 tablet; Refill: 0 - CMP14+EGFR - CBC with Differential/Platelet  2. Hyperlipidemia associated with type 2 diabetes mellitus ([facility]) - atorvastatin (LIPITOR) 80 MG tablet; Take 1 tablet (80 mg total) by mouth daily.  Dispense: 90 tablet; Refill: 0 - CMP14+EGFR - CBC with Differential/Platelet  3. Iron deficiency anemia, unspecified iron deficiency anemia type - terbinafine (LAMISIL) 250 MG tablet; Take 1 tablet (250 mg total) by mouth daily.  Dispense: 90 tablet; Refill: 0 - CMP14+EGFR - CBC with Differential/Platelet  4. Symptomatic anemia - ferrous sulfate 325 (65 FE) MG tablet; Take 1 tablet (325 mg total) by mouth daily.  Dispense: 90 tablet; Refill: 1 - CMP14+EGFR - CBC with Differential/Platelet  5. Type 2 diabetes mellitus with other specified complication, without long-term current use of insulin ([facility]CC) - metFORMIN (GLUCOPHAGE-XR) 500 MG 24 hr tablet; TAKE 1 TABLET BY MOUTH EVERY DAY WITH BREAKFAST  Dispense: 90 tablet; Refill: 1 - Bayer DCA Hb A1c Waived - CMP14+EGFR - CBC with Differential/Platelet  6. Obesity, morbid ([place]) - CMP14+EGFR - CBC with Differential/Platelet  7. Vitamin D deficiency - CMP14+EGFR - CBC with Differential/Platelet   Labs pending Health Maintenance reviewed Diet and exercise encouraged  Follow up plan: 4 months    Evelina Dun, FNP

## 2021-12-01 LAB — CMP14+EGFR
ALT: 25 IU/L (ref 0–32)
AST: 17 IU/L (ref 0–40)
Albumin/Globulin Ratio: 1.7 (ref 1.2–2.2)
Albumin: 4.2 g/dL (ref 3.8–4.9)
Alkaline Phosphatase: 167 IU/L — ABNORMAL HIGH (ref 44–121)
BUN/Creatinine Ratio: 39 — ABNORMAL HIGH (ref 9–23)
BUN: 24 mg/dL (ref 6–24)
Bilirubin Total: <0.2 mg/dL (ref 0.0–1.2)
CO2: 25 mmol/L (ref 20–29)
Calcium: 9.4 mg/dL (ref 8.7–10.2)
Chloride: 103 mmol/L (ref 96–106)
Creatinine, Ser: 0.62 mg/dL (ref 0.57–1.00)
Globulin, Total: 2.5 g/dL (ref 1.5–4.5)
Glucose: 147 mg/dL — ABNORMAL HIGH (ref 70–99)
Potassium: 4.5 mmol/L (ref 3.5–5.2)
Sodium: 141 mmol/L (ref 134–144)
Total Protein: 6.7 g/dL (ref 6.0–8.5)
eGFR: 106 mL/min/{1.73_m2} (ref >59)

## 2021-12-01 LAB — CBC WITH DIFFERENTIAL/PLATELET
Basophils Absolute: 0.1 10*3/uL (ref 0.0–0.2)
Basos: 1 %
EOS (ABSOLUTE): 0.2 10*3/uL (ref 0.0–0.4)
Eos: 2 %
Hematocrit: 37.5 % (ref 34.0–46.6)
Hemoglobin: 12.4 g/dL (ref 11.1–15.9)
Immature Grans (Abs): 0 10*3/uL (ref 0.0–0.1)
Immature Granulocytes: 0 %
Lymphocytes Absolute: 2.1 10*3/uL (ref 0.7–3.1)
Lymphs: 21 %
MCH: 28 pg (ref 26.6–33.0)
MCHC: 33.1 g/dL (ref 31.5–35.7)
MCV: 85 fL (ref 79–97)
Monocytes Absolute: 0.6 10*3/uL (ref 0.1–0.9)
Monocytes: 6 %
Neutrophils Absolute: 7.4 10*3/uL — ABNORMAL HIGH (ref 1.4–7.0)
Neutrophils: 70 %
Platelets: 304 10*3/uL (ref 150–450)
RBC: 4.43 x10E6/uL (ref 3.77–5.28)
RDW: 14.5 % (ref 11.7–15.4)
WBC: 10.3 10*3/uL (ref 3.4–10.8)

## 2021-12-07 ENCOUNTER — Inpatient Hospital Stay (HOSPITAL_COMMUNITY)
Admission: EM | Admit: 2021-12-07 | Discharge: 2021-12-19 | DRG: 871 | Disposition: E | Payer: 59 | Attending: Pulmonary Disease | Admitting: Pulmonary Disease

## 2021-12-07 ENCOUNTER — Emergency Department (HOSPITAL_COMMUNITY): Payer: 59

## 2021-12-07 ENCOUNTER — Encounter (HOSPITAL_COMMUNITY): Payer: Self-pay

## 2021-12-07 DIAGNOSIS — I469 Cardiac arrest, cause unspecified: Secondary | ICD-10-CM

## 2021-12-07 DIAGNOSIS — K56609 Unspecified intestinal obstruction, unspecified as to partial versus complete obstruction: Secondary | ICD-10-CM | POA: Diagnosis present

## 2021-12-07 DIAGNOSIS — E872 Acidosis, unspecified: Secondary | ICD-10-CM | POA: Diagnosis present

## 2021-12-07 DIAGNOSIS — Z7984 Long term (current) use of oral hypoglycemic drugs: Secondary | ICD-10-CM | POA: Diagnosis not present

## 2021-12-07 DIAGNOSIS — G9341 Metabolic encephalopathy: Secondary | ICD-10-CM | POA: Diagnosis present

## 2021-12-07 DIAGNOSIS — K436 Other and unspecified ventral hernia with obstruction, without gangrene: Secondary | ICD-10-CM | POA: Diagnosis present

## 2021-12-07 DIAGNOSIS — J9601 Acute respiratory failure with hypoxia: Secondary | ICD-10-CM | POA: Diagnosis present

## 2021-12-07 DIAGNOSIS — Z1152 Encounter for screening for COVID-19: Secondary | ICD-10-CM | POA: Diagnosis not present

## 2021-12-07 DIAGNOSIS — E876 Hypokalemia: Secondary | ICD-10-CM | POA: Diagnosis present

## 2021-12-07 DIAGNOSIS — E1165 Type 2 diabetes mellitus with hyperglycemia: Secondary | ICD-10-CM | POA: Diagnosis present

## 2021-12-07 DIAGNOSIS — R402432 Glasgow coma scale score 3-8, at arrival to emergency department: Secondary | ICD-10-CM

## 2021-12-07 DIAGNOSIS — E8729 Other acidosis: Secondary | ICD-10-CM | POA: Diagnosis present

## 2021-12-07 DIAGNOSIS — Z6841 Body Mass Index (BMI) 40.0 and over, adult: Secondary | ICD-10-CM | POA: Diagnosis not present

## 2021-12-07 DIAGNOSIS — Z833 Family history of diabetes mellitus: Secondary | ICD-10-CM

## 2021-12-07 DIAGNOSIS — J690 Pneumonitis due to inhalation of food and vomit: Secondary | ICD-10-CM | POA: Diagnosis present

## 2021-12-07 DIAGNOSIS — I10 Essential (primary) hypertension: Secondary | ICD-10-CM | POA: Diagnosis present

## 2021-12-07 DIAGNOSIS — Z79899 Other long term (current) drug therapy: Secondary | ICD-10-CM | POA: Diagnosis not present

## 2021-12-07 DIAGNOSIS — A419 Sepsis, unspecified organism: Secondary | ICD-10-CM | POA: Diagnosis not present

## 2021-12-07 DIAGNOSIS — J9602 Acute respiratory failure with hypercapnia: Secondary | ICD-10-CM | POA: Diagnosis present

## 2021-12-07 DIAGNOSIS — J9691 Respiratory failure, unspecified with hypoxia: Secondary | ICD-10-CM | POA: Diagnosis present

## 2021-12-07 DIAGNOSIS — Z66 Do not resuscitate: Secondary | ICD-10-CM | POA: Diagnosis not present

## 2021-12-07 DIAGNOSIS — E7800 Pure hypercholesterolemia, unspecified: Secondary | ICD-10-CM | POA: Diagnosis present

## 2021-12-07 DIAGNOSIS — N179 Acute kidney failure, unspecified: Secondary | ICD-10-CM | POA: Diagnosis present

## 2021-12-07 DIAGNOSIS — R6521 Severe sepsis with septic shock: Secondary | ICD-10-CM | POA: Diagnosis present

## 2021-12-07 LAB — CBC WITH DIFFERENTIAL/PLATELET
Abs Immature Granulocytes: 0.18 10*3/uL — ABNORMAL HIGH (ref 0.00–0.07)
Basophils Absolute: 0.1 10*3/uL (ref 0.0–0.1)
Basophils Relative: 1 %
Eosinophils Absolute: 0.1 10*3/uL (ref 0.0–0.5)
Eosinophils Relative: 1 %
HCT: 55.2 % — ABNORMAL HIGH (ref 36.0–46.0)
Hemoglobin: 17.1 g/dL — ABNORMAL HIGH (ref 12.0–15.0)
Immature Granulocytes: 1 %
Lymphocytes Relative: 27 %
Lymphs Abs: 5.6 10*3/uL — ABNORMAL HIGH (ref 0.7–4.0)
MCH: 28.4 pg (ref 26.0–34.0)
MCHC: 31 g/dL (ref 30.0–36.0)
MCV: 91.7 fL (ref 80.0–100.0)
Monocytes Absolute: 1.8 10*3/uL — ABNORMAL HIGH (ref 0.1–1.0)
Monocytes Relative: 9 %
Neutro Abs: 13 10*3/uL — ABNORMAL HIGH (ref 1.7–7.7)
Neutrophils Relative %: 61 %
Platelets: 462 10*3/uL — ABNORMAL HIGH (ref 150–400)
RBC: 6.02 MIL/uL — ABNORMAL HIGH (ref 3.87–5.11)
RDW: 15.9 % — ABNORMAL HIGH (ref 11.5–15.5)
WBC: 20.8 10*3/uL — ABNORMAL HIGH (ref 4.0–10.5)
nRBC: 0 % (ref 0.0–0.2)

## 2021-12-07 LAB — BLOOD GAS, ARTERIAL
Acid-base deficit: 21.4 mmol/L — ABNORMAL HIGH (ref 0.0–2.0)
Bicarbonate: 12.5 mmol/L — ABNORMAL LOW (ref 20.0–28.0)
Drawn by: 23430
FIO2: 100 %
O2 Saturation: 95.4 %
Patient temperature: 33.2
pCO2 arterial: 57 mmHg — ABNORMAL HIGH (ref 32–48)
pH, Arterial: <6.95 — CL (ref 7.35–7.45)
pO2, Arterial: 86 mmHg (ref 83–108)

## 2021-12-07 LAB — I-STAT CHEM 8, ED
BUN: 56 mg/dL — ABNORMAL HIGH (ref 6–20)
Calcium, Ion: 0.92 mmol/L — ABNORMAL LOW (ref 1.15–1.40)
Chloride: 101 mmol/L (ref 98–111)
Creatinine, Ser: 2.9 mg/dL — ABNORMAL HIGH (ref 0.44–1.00)
Glucose, Bld: 446 mg/dL — ABNORMAL HIGH (ref 70–99)
HCT: 48 % — ABNORMAL HIGH (ref 36.0–46.0)
Hemoglobin: 16.3 g/dL — ABNORMAL HIGH (ref 12.0–15.0)
Potassium: 4.5 mmol/L (ref 3.5–5.1)
Sodium: 137 mmol/L (ref 135–145)
TCO2: 18 mmol/L — ABNORMAL LOW (ref 22–32)

## 2021-12-07 LAB — I-STAT ARTERIAL BLOOD GAS, ED
Acid-base deficit: 18 mmol/L — ABNORMAL HIGH (ref 0.0–2.0)
Bicarbonate: 14.5 mmol/L — ABNORMAL LOW (ref 20.0–28.0)
Calcium, Ion: 0.94 mmol/L — ABNORMAL LOW (ref 1.15–1.40)
HCT: 46 % (ref 36.0–46.0)
Hemoglobin: 15.6 g/dL — ABNORMAL HIGH (ref 12.0–15.0)
O2 Saturation: 71 %
Potassium: 4.6 mmol/L (ref 3.5–5.1)
Sodium: 136 mmol/L (ref 135–145)
TCO2: 16 mmol/L — ABNORMAL LOW (ref 22–32)
pCO2 arterial: 60.5 mmHg — ABNORMAL HIGH (ref 32–48)
pH, Arterial: 6.987 — CL (ref 7.35–7.45)
pO2, Arterial: 57 mmHg — ABNORMAL LOW (ref 83–108)

## 2021-12-07 LAB — CBG MONITORING, ED: Glucose-Capillary: 259 mg/dL — ABNORMAL HIGH (ref 70–99)

## 2021-12-07 LAB — COMPREHENSIVE METABOLIC PANEL
ALT: 34 U/L (ref 0–44)
AST: 31 U/L (ref 15–41)
Albumin: 5.5 g/dL — ABNORMAL HIGH (ref 3.5–5.0)
Alkaline Phosphatase: 168 U/L — ABNORMAL HIGH (ref 38–126)
Anion gap: 30 — ABNORMAL HIGH (ref 5–15)
BUN: 43 mg/dL — ABNORMAL HIGH (ref 6–20)
CO2: 22 mmol/L (ref 22–32)
Calcium: 11.6 mg/dL — ABNORMAL HIGH (ref 8.9–10.3)
Chloride: 91 mmol/L — ABNORMAL LOW (ref 98–111)
Creatinine, Ser: 2.73 mg/dL — ABNORMAL HIGH (ref 0.44–1.00)
GFR, Estimated: 20 mL/min — ABNORMAL LOW (ref >60)
Glucose, Bld: 317 mg/dL — ABNORMAL HIGH (ref 70–99)
Potassium: 2.6 mmol/L — CL (ref 3.5–5.1)
Sodium: 143 mmol/L (ref 135–145)
Total Bilirubin: 0.8 mg/dL (ref 0.3–1.2)
Total Protein: 10.3 g/dL — ABNORMAL HIGH (ref 6.5–8.1)

## 2021-12-07 LAB — LACTIC ACID, PLASMA: Lactic Acid, Venous: >9 mmol/L (ref 0.5–1.9)

## 2021-12-07 LAB — PROTIME-INR
INR: 1.1 (ref 0.8–1.2)
Prothrombin Time: 13.9 seconds (ref 11.4–15.2)

## 2021-12-07 LAB — RESP PANEL BY RT-PCR (FLU A&B, COVID) ARPGX2
Influenza A by PCR: NEGATIVE
Influenza B by PCR: NEGATIVE
SARS Coronavirus 2 by RT PCR: NEGATIVE

## 2021-12-07 LAB — LIPASE, BLOOD: Lipase: 112 U/L — ABNORMAL HIGH (ref 11–51)

## 2021-12-07 MED ORDER — NOREPINEPHRINE 4 MG/250ML-% IV SOLN
INTRAVENOUS | Status: DC | PRN
  Administered 2021-12-07: 2 ug/min via INTRAVENOUS

## 2021-12-07 MED ORDER — DEXTROSE 50 % IV SOLN: 0.0000 mL | INTRAVENOUS | Status: DC | PRN

## 2021-12-07 MED ORDER — DOCUSATE SODIUM 50 MG/5ML PO LIQD: 100.0000 mg | Freq: Two times a day (BID) | ORAL | Status: DC | PRN

## 2021-12-07 MED ORDER — SODIUM BICARBONATE 8.4 % IV SOLN
INTRAVENOUS | Status: AC
  Filled 2021-12-07: qty 50

## 2021-12-07 MED ORDER — POTASSIUM CHLORIDE 10 MEQ/100ML IV SOLN
10.0000 meq | INTRAVENOUS | Status: AC
  Administered 2021-12-07 – 2021-12-08 (×2): 10 meq via INTRAVENOUS
  Filled 2021-12-07 (×3): qty 100

## 2021-12-07 MED ORDER — ETOMIDATE 2 MG/ML IV SOLN
INTRAVENOUS | Status: AC
  Filled 2021-12-07: qty 20

## 2021-12-07 MED ORDER — INSULIN REGULAR(HUMAN) IN NACL 100-0.9 UT/100ML-% IV SOLN
INTRAVENOUS | Status: DC
  Filled 2021-12-07: qty 100

## 2021-12-07 MED ORDER — DEXTROSE IN LACTATED RINGERS 5 % IV SOLN: INTRAVENOUS | Status: DC

## 2021-12-07 MED ORDER — ROCURONIUM BROMIDE 10 MG/ML (PF) SYRINGE
PREFILLED_SYRINGE | INTRAVENOUS | Status: AC
  Filled 2021-12-07: qty 10

## 2021-12-07 MED ORDER — VASOPRESSIN 20 UNITS/100 ML INFUSION FOR SHOCK
INTRAVENOUS | Status: AC
  Administered 2021-12-07: 0.03 [IU]/min via INTRAVENOUS
  Filled 2021-12-07: qty 100

## 2021-12-07 MED ORDER — EPINEPHRINE HCL 5 MG/250ML IV SOLN IN NS
INTRAVENOUS | Status: AC
  Administered 2021-12-07: 0.5 ug/min via INTRAVENOUS
  Filled 2021-12-07: qty 250

## 2021-12-07 MED ORDER — SODIUM BICARBONATE 8.4 % IV SOLN
50.0000 meq | Freq: Once | INTRAVENOUS | Status: AC
  Administered 2021-12-07: 50 meq via INTRAVENOUS
  Filled 2021-12-07: qty 50

## 2021-12-07 MED ORDER — ETOMIDATE 2 MG/ML IV SOLN
INTRAVENOUS | Status: DC | PRN
  Administered 2021-12-07: 30 mg via INTRAVENOUS

## 2021-12-07 MED ORDER — ETOMIDATE 2 MG/ML IV SOLN
INTRAVENOUS | Status: AC
  Filled 2021-12-07: qty 10

## 2021-12-07 MED ORDER — SODIUM CHLORIDE 0.9 % IV BOLUS
1000.0000 mL | Freq: Once | INTRAVENOUS | Status: AC
  Administered 2021-12-07: 1000 mL via INTRAVENOUS

## 2021-12-07 MED ORDER — SODIUM CHLORIDE 0.9 % IV SOLN: 250.0000 mL | INTRAVENOUS | Status: DC

## 2021-12-07 MED ORDER — VANCOMYCIN HCL IN DEXTROSE 1-5 GM/200ML-% IV SOLN
1000.0000 mg | INTRAVENOUS | Status: AC
  Administered 2021-12-07: 1000 mg via INTRAVENOUS

## 2021-12-07 MED ORDER — NOREPINEPHRINE 4 MG/250ML-% IV SOLN
2.0000 ug/min | INTRAVENOUS | Status: DC
  Administered 2021-12-07: 40 ug/min via INTRAVENOUS
  Administered 2021-12-07: 10 ug/min via INTRAVENOUS
  Administered 2021-12-08: 40 ug/min via INTRAVENOUS
  Filled 2021-12-07 (×4): qty 250

## 2021-12-07 MED ORDER — SUCCINYLCHOLINE CHLORIDE 200 MG/10ML IV SOSY
PREFILLED_SYRINGE | INTRAVENOUS | Status: AC
  Filled 2021-12-07: qty 10

## 2021-12-07 MED ORDER — EPINEPHRINE HCL 5 MG/250ML IV SOLN IN NS
0.5000 ug/min | INTRAVENOUS | Status: DC
  Administered 2021-12-07: 20 ug/min via INTRAVENOUS
  Filled 2021-12-07: qty 250

## 2021-12-07 MED ORDER — SODIUM CHLORIDE 0.9 % IV SOLN
INTRAVENOUS | Status: DC | PRN
  Administered 2021-12-07: 1000 mL via INTRAVENOUS

## 2021-12-07 MED ORDER — PHENYLEPHRINE HCL-NACL 20-0.9 MG/250ML-% IV SOLN
0.0000 ug/min | INTRAVENOUS | Status: DC
  Administered 2021-12-07 – 2021-12-08 (×2): 400 ug/min via INTRAVENOUS
  Filled 2021-12-07 (×2): qty 250

## 2021-12-07 MED ORDER — PANTOPRAZOLE SODIUM 40 MG IV SOLR: 40.0000 mg | Freq: Every day | INTRAVENOUS | Status: DC

## 2021-12-07 MED ORDER — PIPERACILLIN-TAZOBACTAM 3.375 G IVPB 30 MIN
3.3750 g | Freq: Once | INTRAVENOUS | Status: AC
  Administered 2021-12-07: 3.375 g via INTRAVENOUS

## 2021-12-07 MED ORDER — POTASSIUM CHLORIDE 10 MEQ/100ML IV SOLN: 10.0000 meq | INTRAVENOUS | Status: DC

## 2021-12-07 MED ORDER — PHENYLEPHRINE HCL-NACL 20-0.9 MG/250ML-% IV SOLN
INTRAVENOUS | Status: AC
  Administered 2021-12-07: 300 ug/min via INTRAVENOUS
  Filled 2021-12-07: qty 250

## 2021-12-07 MED ORDER — VASOPRESSIN 20 UNITS/100 ML INFUSION FOR SHOCK
0.0000 [IU]/min | INTRAVENOUS | Status: DC
  Administered 2021-12-07: 0.04 [IU]/min via INTRAVENOUS
  Filled 2021-12-07: qty 100

## 2021-12-07 MED ORDER — SODIUM BICARBONATE 8.4 % IV SOLN
INTRAVENOUS | Status: AC
  Filled 2021-12-07: qty 150

## 2021-12-07 MED ORDER — SODIUM BICARBONATE 8.4 % IV SOLN
Freq: Once | INTRAVENOUS | Status: AC
  Filled 2021-12-07: qty 1000

## 2021-12-07 MED ORDER — LACTATED RINGERS IV SOLN: INTRAVENOUS | Status: DC

## 2021-12-07 MED ORDER — EPINEPHRINE 0.1 MG/10ML (10 MCG/ML) SYRINGE FOR IV PUSH (FOR BLOOD PRESSURE SUPPORT)
PREFILLED_SYRINGE | INTRAVENOUS | Status: DC | PRN
  Administered 2021-12-07 (×3): 10 ug via INTRAVENOUS

## 2021-12-07 MED ORDER — POLYETHYLENE GLYCOL 3350 17 G PO PACK: 17.0000 g | PACK | Freq: Every day | ORAL | Status: DC | PRN

## 2021-12-07 MED ORDER — ROCURONIUM BROMIDE 50 MG/5ML IV SOLN
INTRAVENOUS | Status: DC | PRN
  Administered 2021-12-07: 150 mg via INTRAVENOUS

## 2021-12-07 MED ORDER — EPINEPHRINE 1 MG/10ML IJ SOSY
PREFILLED_SYRINGE | INTRAMUSCULAR | Status: DC | PRN
  Administered 2021-12-07 (×3): 1 mg via INTRAVENOUS

## 2021-12-07 NOTE — ED Provider Notes (Signed)
[facility] EMERGENCY DEPARTMENT Provider Note  CSN: [id number] Arrival date & time: 11/22/2021 1650  Chief Complaint(s) unresponsive  HPI Gabriela Watkins is a 56 y.o. female with a history of small bowel obstruction, diabetes, abdominal wall hernia presenting with cardiac arrest.  Patient was reportedly complaining to son all day about abdominal pain, nausea and vomiting.  Symptoms worsened and she began complaining of shortness of breath, EMS was called, on route she reportedly developed oxygen requirement and became lethargic, hypotensive.  On arrival to the emergency department the patient had no pulse and CPR was initiated.  History limited due to acuity of condition, unresponsive   Past Medical History Past Medical History:  Diagnosis Date   Anemia    High cholesterol    Hypertension    Small bowel obstruction ([place])    hospital admission 08/24/2012   Type II diabetes mellitus [facility])    Patient Active Problem List   Diagnosis Date Noted   Symptomatic anemia 07/23/2021   Iron deficiency anemia 11/21/2016   Vitamin D deficiency 03/02/2015   Hyperlipidemia associated with type 2 diabetes mellitus ([place]) 08/02/2013   Small bowel obstruction ([place]) 08/27/2012   Diabetes mellitus ([place]) 08/27/2012   Hypertension associated with diabetes ([place]) 08/27/2012   Obesity, morbid ([place]) 08/27/2012   Hx of hernia repair 08/27/2012   Home Medication(s) Prior to Admission medications   Medication Sig Start Date End Date Taking? Authorizing Provider  atorvastatin (LIPITOR) 80 MG tablet Take 1 tablet (80 mg total) by mouth daily. 11/30/21   Sharion Balloon, FNP  blood glucose meter kit and supplies KIT Dispense based on patient and insurance preference. Use up to four times daily as directed. (FOR ICD-9 250.00, 250.01). 06/01/15   Evelina Dun A, FNP  ferrous sulfate 325 (65 FE) MG tablet Take 1 tablet (325 mg total) by mouth daily. 11/30/21   Evelina Dun A, FNP  glucose blood (ACCU-CHEK GUIDE)  test strip Test BS up to 4 times daily Dx E11.9 11/21/20   Evelina Dun A, FNP  ibuprofen (ADVIL) 200 MG tablet Take 400-600 mg by mouth every 6 (six) hours as needed for mild pain.    [provider]  Lancets 30G MISC Check blood glucose BID and prn 03/02/15   Evelina Dun A, FNP  lisinopril (ZESTRIL) 20 MG tablet Take 1 tablet (20 mg total) by mouth daily. 11/30/21   Evelina Dun A, FNP  metFORMIN (GLUCOPHAGE-XR) 500 MG 24 hr tablet TAKE 1 TABLET BY MOUTH EVERY DAY WITH BREAKFAST 11/30/21   Hawks, Christy A, FNP  terbinafine (LAMISIL) 250 MG tablet Take 1 tablet (250 mg total) by mouth daily. 11/30/21   Sharion Balloon, FNP                                                                                                                                    Past Surgical History Past Surgical History:  Procedure Laterality  Date   COLONOSCOPY WITH PROPOFOL N/A 07/25/2021   Procedure: COLONOSCOPY WITH PROPOFOL;  Surgeon: Juanita Craver, MD;  Location[facility] ENDOSCOPY;  Service: Gastroenterology;  Laterality: N/A;   ESOPHAGOGASTRODUODENOSCOPY (EGD) WITH PROPOFOL N/A 07/25/2021   Procedure: ESOPHAGOGASTRODUODENOSCOPY (EGD) WITH PROPOFOL;  Surgeon: Juanita Craver, MD;  Location: [facility] ENDOSCOPY;  Service: Gastroenterology;  Laterality: N/A;   GIVENS CAPSULE STUDY N/A 07/25/2021   Procedure: GIVENS CAPSULE STUDY;  Surgeon: Juanita Craver, MD;  Location: [place];  Service: Gastroenterology;  Laterality: N/A;   HERNIA REPAIR  ~ 01/2009; 04/2009   "stomach" (08/25/2012)   LEFT OOPHORECTOMY  ~ 2004   "tumor" (08/25/2012)   RIGHT OOPHORECTOMY  ~ 2007   "tumor; only took off a portion of the ovary" (08/25/2012)   Family History Family History  Problem Relation Age of Onset   Diabetes Mother     Social History Social History   Tobacco Use   Smoking status: Never   Smokeless tobacco: Never  Vaping Use   Vaping Use: Never used  Substance Use Topics   Alcohol use: No   Drug use: No    Allergies Patient has no known allergies.  Review of Systems Review of Systems  Unable to perform ROS: Intubated    Physical Exam Vital Signs  I have reviewed the triage vital signs BP (!) 73/50   Pulse 98   Temp (!) 95.9 F (35.5 C)   Resp (!) 32   Ht _0  (1.626 m)   SpO2 96%   BMI 48.82 kg/m  Physical Exam Vitals and nursing note reviewed.  Constitutional:      General: She is in acute distress.     Appearance: She is ill-appearing.  HENT:     Head: Normocephalic and atraumatic.     Right Ear: External ear normal.     Left Ear: External ear normal.     Mouth/Throat:     Mouth: Mucous membranes are dry.  Eyes:     Comments: Dilated, fixed  Cardiovascular:     Rate and Rhythm: Regular rhythm. Tachycardia present.  Pulmonary:     Comments: Coarse breath sounds equal bilaterally with bagging. Apneic  Abdominal:     General: There is distension.     Palpations: Abdomen is soft.     Hernia: A hernia (large ventral hernia) is present.  Musculoskeletal:     Comments: Trace b/l edema  Skin:    Comments: Cool, clammy  Neurological:     Comments: Unresponsive, taking spontaneous breaths prior to intubation  Psychiatric:     Comments: Unable to assess     ED Results and Treatments Labs (all labs ordered are listed, but only abnormal results are displayed) Labs Reviewed  COMPREHENSIVE METABOLIC PANEL - Abnormal; Notable for the following components:      Result Value   Potassium 2.6 (*)    Chloride 91 (*)    Glucose, Bld 317 (*)    BUN 43 (*)    Creatinine, Ser 2.73 (*)    Calcium 11.6 (*)    Total Protein 10.3 (*)    Albumin 5.5 (*)    Alkaline Phosphatase 168 (*)    GFR, Estimated 20 (*)    Anion gap 30 (*)    All other components within normal limits  CBC WITH DIFFERENTIAL/PLATELET - Abnormal; Notable for the following components:   WBC 20.8 (*)    RBC 6.02 (*)    Hemoglobin 17.1 (*)    HCT 55.2 (*)  RDW 15.9 (*)    Platelets 462 (*)     Neutro Abs 13.0 (*)    Lymphs Abs 5.6 (*)    Monocytes Absolute 1.8 (*)    Abs Immature Granulocytes 0.18 (*)    All other components within normal limits  BLOOD GAS, ARTERIAL - Abnormal; Notable for the following components:   pH, Arterial <6.95 (*)    pCO2 arterial 57 (*)    Bicarbonate 12.5 (*)    Acid-base deficit 21.4 (*)    Allens test (pass/fail) NOT INDICATED (*)    All other components within normal limits  LIPASE, BLOOD - Abnormal; Notable for the following components:   Lipase 112 (*)    All other components within normal limits  LACTIC ACID, PLASMA - Abnormal; Notable for the following components:   Lactic Acid, Venous >9.0 (*)    All other components within normal limits  CBG MONITORING, ED - Abnormal; Notable for the following components:   Glucose-Capillary 259 (*)    All other components within normal limits  RESP PANEL BY RT-PCR (FLU A&B, COVID) ARPGX2  PROTIME-INR  URINALYSIS, ROUTINE W REFLEX MICROSCOPIC  RAPID URINE DRUG SCREEN, HOSP PERFORMED  I-STAT CHEM 8, ED                                                                                                                          Radiology CT CHEST ABDOMEN PELVIS WO CONTRAST  Result Date: 11/22/2021 CLINICAL DATA:  Pneumonia, complication suspected, xray done Shortness of breath and severe abdominal pain. Became unresponsive in CPR was initiated. EXAM: CT CHEST, ABDOMEN AND PELVIS WITHOUT CONTRAST TECHNIQUE: Multidetector CT imaging of the chest, abdomen and pelvis was performed following the standard protocol without IV contrast. RADIATION DOSE REDUCTION: This exam was performed according to the departmental dose-optimization program which includes automated exposure control, adjustment of the mA and/or kV according to patient size and/or use of iterative reconstruction technique. COMPARISON:  Chest radiograph earlier today. Abdominopelvic CT 11/28/2020 FINDINGS: CT CHEST FINDINGS Cardiovascular: Mild  cardiomegaly. No significant pericardial effusion. Thoracic aorta is tortuous. No aortic aneurysm. Mediastinum/Nodes: Endotracheal tube tip just above the carina. Enteric tube decompresses the esophagus, tip in the stomach, side-port in the distal esophagus. Motion and lack contrast limits detailed assessment, allowing for this there is no bulky adenopathy or mediastinal mass. Lungs/Pleura: Dense airspace disease in the dependent right upper and right lower lobes. There is also a small right pleural effusion. Moderate airspace disease in the dependent left lower lobe. No convincing features of pulmonary edema. No definite endoluminal debris or retained mucus. Musculoskeletal: Evidence of acute osseous abnormality or anterior rib fracture, although breathing motion artifact partially obscures assessment. No chest wall contusion. CT ABDOMEN PELVIS FINDINGS Hepatobiliary: Enlarged liver spanning 25.8 cm cranial caudal. Suspected steatosis. No obvious focal liver abnormality on this unenhanced exam. The gallbladder is not seen. Common bile duct is poorly defined, but appears nondilated. Pancreas: Grossly negative. Spleen: Normal in size. Small peripheral  subcapsular calcification. Adrenals/Urinary Tract: No adrenal nodule. No hydronephrosis, renal calculi or evidence of focal renal abnormality. The urinary bladder is decompressed by Foley catheter. Stomach/Bowel: The enteric tube tip is in the stomach, but side port is in the distal esophagus.There is a large midline ventral abdominal wall hernia. The stomach is markedly distended with intraluminal fluid, with the distal aspect of the stomach extending into the ventral hernia. Dilated fluid-filled small bowel proximally. Much of the small bowel is located within the large ventral hernia. There is transition from dilated to nondilated small bowel in the right aspect of the hernia, for example series 8, images 48 and 49, and coronal series 10, image 83. The more distal  small bowel is decompressed. Multiple enteric sutures suggestive of prior enterotomy. Majority of the colon is located within the large ventral abdominal wall hernia, with the descending colon exiting and entering the hernia twice. Small volume of stool in the colon, no abnormal colonic distention or inflammation. There is no bowel pneumatosis. Vascular/Lymphatic: Mild aortic atherosclerosis, no aneurysm. No portal venous or mesenteric gas. There is no obvious bulky abdominopelvic adenopathy. Reproductive: Unremarkable uterus. There is no evidence of adnexal mass, low fluid-filled small bowel in the pelvis limits detailed assessment. Other: Large midline lower ventral abdominal wall hernia as described. No ascites. No free air. No abdominopelvic collection. Musculoskeletal: Mild multilevel lumbar degenerative change. There are no acute or suspicious osseous abnormalities. IMPRESSION: 1. Dense airspace disease in the dependent right upper and right lower lobes as well as left lower lobe, consistent with pneumonia, including aspiration. Small right pleural effusion. 2. Small bowel obstruction with transition point within a large midline lower ventral abdominal wall hernia. The stomach is massively distended with fluid. This hernia contains segments of the distal stomach, small bowel and colon. 3. Hepatomegaly and hepatic steatosis. 4. Endotracheal tube tip just above the carina. Enteric tube tip is in the stomach, but side-port is in the distal esophagus. Recommend advancement. Aortic Atherosclerosis (ICD10-I70.0). Electronically Signed   By: Keith Rake M.D.   On: 12/11/2021 20:23   CT Head Wo Contrast  Result Date: 11/28/2021 CLINICAL DATA:  Mental status change; unresponsive EXAM: CT HEAD WITHOUT CONTRAST TECHNIQUE: Contiguous axial images were obtained from the base of the skull through the vertex without intravenous contrast. RADIATION DOSE REDUCTION: This exam was performed according to the  departmental dose-optimization program which includes automated exposure control, adjustment of the mA and/or kV according to patient size and/or use of iterative reconstruction technique. COMPARISON:  CT head 07/23/2021 FINDINGS: Brain: No intracranial hemorrhage, mass effect, or evidence of acute infarct. No hydrocephalus. No extra-axial fluid collection. Arachnoid herniation into the sella, a frequent normal variant. Vascular: No hyperdense vessel or unexpected calcification. Skull: No fracture or focal lesion. Sinuses/Orbits: No acute finding. Other: Partially visualized endotracheal and enteric tubes. IMPRESSION: No acute intracranial abnormality. Electronically Signed   By: Placido Sou M.D.   On: 12/11/2021 20:20   DG Chest Portable 1 View  Result Date: 12/12/2021 CLINICAL DATA:  Acute respiratory failure. Endotracheal tube and orogastric tube placement. EXAM: PORTABLE CHEST 1 VIEW COMPARISON:  None Available. FINDINGS: Endotracheal tube is seen with tip approximately 2 cm above the carina. Orogastric tube is seen with tip in the proximal stomach. Low lung volumes are seen with linear opacity in both lung bases consistent with subsegmental atelectasis. No evidence of pulmonary consolidation or pleural effusion. Heart size is within normal limits allowing for low lung volumes. IMPRESSION: Endotracheal tube and orogastric tube  in appropriate position. Low lung volumes and bibasilar subsegmental atelectasis. Electronically Signed   By: Marlaine Hind M.D.   On: 12/06/2021 17:35    Pertinent labs & imaging results that were available during my care of the patient were reviewed by me and considered in my medical decision making (see MDM for details).  Medications Ordered in ED Medications  0.9 %  sodium chloride infusion (has no administration in time range)  norepinephrine (LEVOPHED) 39m in 2571m(0.016 mg/mL) premix infusion (10 mcg/min Intravenous New Bag/Given 12/16/2021 1905)  EPINEPHrine  (ADRENALIN) 1 MG/10ML injection (1 mg Intravenous Given 11/23/2021 1651)  vasopressin (PITRESSIN) 20 Units in sodium chloride 0.9 % 100 mL infusion-*FOR SHOCK* (0.03 Units/min Intravenous New Bag/Given 12/17/2021 1729)  rocuronium (ZEMURON) injection (150 mg Intravenous See Procedure Record 11/26/2021 1830)  etomidate (AMIDATE) injection ( Intravenous See Procedure Record 11/30/2021 1826)  norepinephrine (LEVOPHED) 34m40mn 250m8m.016 mg/mL) premix infusion (0 mcg/kg/min Intravenous Stopped 11/30/2021 1905)  0.9 %  sodium chloride infusion (1,000 mLs Intravenous New Bag/Given 12/12/2021 1659)  EPINEPHrine (ADRENALIN) 5 mg in NS 250 mL (0.02 mg/mL) premix infusion (10 mcg/min Intravenous Rate/Dose Change 12/03/2021 1913)  vancomycin (VANCOCIN) IVPB 1000 mg/200 mL premix (1,000 mg Intravenous New Bag/Given 12/04/2021 1902)  potassium chloride 10 mEq in 100 mL IVPB (has no administration in time range)  EPINEPhrine 10 mcg/mL Adult IV Push Syringe (For Blood Pressure Support) (10 mcg Intravenous Given 12/18/2021 1755)  piperacillin-tazobactam (ZOSYN) IVPB 3.375 g (3.375 g Intravenous New Bag/Given 12/11/2021 1847)  sodium chloride 0.9 % bolus 1,000 mL (0 mLs Intravenous Stopped 11/27/2021 1902)  sodium chloride 0.9 % bolus 1,000 mL (0 mLs Intravenous Stopped 12/01/2021 1902)  sodium chloride 0.9 % bolus 1,000 mL (1,000 mLs Intravenous New Bag/Given 12/07/2021 1904)  sodium bicarbonate 150 mEq in dextrose 5 % 1,150 mL infusion ( Intravenous New Bag/Given 12/06/2021 2010)  sodium bicarbonate injection 50 mEq (50 mEq Intravenous Given 11/18/2021 1910)                                                                                                                                     Procedures .Critical Care  Performed by: ScheCristie Hem Authorized by: ScheCristie Hem   Critical care provider statement:    Critical care time (minutes):  75   Critical care end time:  11/21/2021 8:45 PM   Critical care time was  exclusive of:  Separately billable procedures and treating other patients   Critical care was necessary to treat or prevent imminent or life-threatening deterioration of the following conditions:  Cardiac failure, circulatory failure, shock, dehydration, renal failure, respiratory failure and metabolic crisis   Critical care was time spent personally by me on the following activities:  Development of treatment plan with patient or surrogate, discussions with consultants, evaluation of patient's response to treatment, examination of patient, ordering and review of laboratory studies, ordering and review of  radiographic studies, ordering and performing treatments and interventions, pulse oximetry, re-evaluation of patient's condition, review of old charts and gastric intubation   Care discussed with: accepting provider at another facility   .Central Line  Date/Time: 12/05/2021 8:46 PM  Performed by: Cristie Hem, MD Authorized by: Cristie Hem, MD   Consent:    Consent obtained:  Emergent situation Universal protocol:    Relevant documents present and verified: yes     Test results available: yes     Imaging studies available: yes     Required blood products, implants, devices, and special equipment available: yes     Site/side marked: yes     Immediately prior to procedure, a time out was called: yes     Patient identity confirmed:  Arm band, hospital-assigned identification number and provided demographic data Pre-procedure details:    Indication(s): central venous access and insufficient peripheral access     Hand hygiene: Hand hygiene performed prior to insertion     Sterile barrier technique: All elements of maximal sterile technique followed     Skin preparation:  Chlorhexidine   Skin preparation agent: Skin preparation agent completely dried prior to procedure   Anesthesia:    Anesthesia method:  None Procedure details:    Location:  R femoral   Patient position:   Supine   Procedural supplies:  Triple lumen   Catheter size:  7 Fr   Ultrasound guidance: yes     Ultrasound guidance timing: real time     Sterile ultrasound techniques: Sterile gel and sterile probe covers were used     Number of attempts:  1   Successful placement: yes   Post-procedure details:    Post-procedure:  Dressing applied and line sutured   Assessment:  Blood return through all ports and free fluid flow   Procedure completion:  Tolerated well, no immediate complications Procedure Name: Intubation Date/Time: 12/06/2021 8:47 PM  Performed by: Cristie Hem, MDPre-anesthesia Checklist: Patient identified, Timeout performed, Suction available and Patient being monitored Oxygen Delivery Method: Ambu bag Preoxygenation: Pre-oxygenation with 100% oxygen Induction Type: Rapid sequence Ventilation: Mask ventilation without difficulty Laryngoscope Size: Glidescope Grade View: Grade I Tube size: 7.0 mm Number of attempts: 1 Airway Equipment and Method: Rigid stylet and Video-laryngoscopy Placement Confirmation: ETT inserted through vocal cords under direct vision, Positive ETCO2, CO2 detector and Breath sounds checked- equal and bilateral Secured at: 22 cm Tube secured with: ETT holder    ARTERIAL LINE  Date/Time: 12/05/2021 8:49 PM  Performed by: Cristie Hem, MD Authorized by: Cristie Hem, MD   Consent:    Consent obtained:  Emergent situation Universal protocol:    Procedure explained and questions answered to patient or proxy's satisfaction: yes     Relevant documents present and verified: yes     Test results available: yes     Imaging studies available: yes     Required blood products, implants, devices, and special equipment available: yes     Site/side marked: yes     Immediately prior to procedure, a time out was called: yes     Patient identity confirmed:  Arm band and hospital-assigned identification number Pre-procedure details:     Skin preparation:  Chlorhexidine Sedation:    Sedation type:  None Anesthesia:    Anesthesia method:  None Procedure details:    Location:  L radial   Allen's test performed: yes     Allen's test abnormal: no     Needle gauge:  20 G   Placement technique:  Ultrasound guided   Number of attempts:  2   Transducer: waveform confirmed   Post-procedure details:    Post-procedure:  Sterile dressing applied and sutured   CMS:  Unable to assess   Procedure completion:  Tolerated well, no immediate complications   (including critical care time)  Medical Decision Making / ED Course   MDM:  56 year old female presenting via EMS for reported abdominal pain and shortness of breath.  Unresponsive on arrival to the emergency department with no pulses.  CPR was performed, received 3 rounds of epinephrine prior to [place].  Postarrest care initiated including targeted temperature management with Foley catheter.  Patient intubated following ROSC, see procedure note for details.  Following ROSC, patient developed profound hypotension, necessitating large-volume fluid resuscitation, initiation of norepinephrine, vasopressin, and epinephrine drips.  Laboratory testing was notable for severe acidosis with severe lactic acidosis, increased respiratory rate on ventilator as patient also had respiratory acidosis.  Initiated bicarb infusion and 1 amp of bicarb while this being prepared.  OG tube placed, with significant drainage of dark, foul-smelling gastric contents, CT scan initiated, reviewed by me, appears to show significant gastric distention, concerning for possible obstructive process.  CT chest also performed.  Laboratory testing notable for significant hypokalemia, leukocytosis, acute kidney injury.  CT head unremarkable.  Patient is off sedation and remains unresponsive.  Difficulty breathing possibly due to aspiration or similar events have initiated broad-spectrum antibiotics.  Patient's son came, I  discussed the patient's anticipated poor prognosis given severe acidosis, significant vasopressor requirement.  She is functional at baseline and this is an acute change so he would prefer to continue aggressive resuscitation.  Discussed case with intensivist Dr. Lynetta Mare who states there are no available beds within the health system for ICU care.  He recommends ER to ER transfer.  Discussed with Dr. Maryan Rued at [facility] who is excepted the patient for transfer.  Patient will be transported via [place] as of 12/11/2021 2102  Fri Dec 07, 2021  1806 Discussed with Dr Lynetta Mare recommends ED to ED transport [WS]  2100 CT scan result with aspiration pneumonia as well as small bowel obstruction with hernia involvement.  Enteric tube not fully advanced. Discussed with nursing and tube advanced prior to transport via [facility] [WS]    Clinical Course User Index [WS] Cristie Hem, MD     Additional history obtained: -Additional history obtained from ems -External records from outside source obtained and reviewed including: Chart review including previous notes, labs, imaging, consultation notes including progress note 11/30/21   Lab Tests: -I ordered, reviewed, and interpreted labs.   The pertinent results include:   Labs Reviewed  COMPREHENSIVE METABOLIC PANEL - Abnormal; Notable for the following components:      Result Value   Potassium 2.6 (*)    Chloride 91 (*)    Glucose, Bld 317 (*)    BUN 43 (*)    Creatinine, Ser 2.73 (*)    Calcium 11.6 (*)    Total Protein 10.3 (*)    Albumin 5.5 (*)    Alkaline Phosphatase 168 (*)    GFR, Estimated 20 (*)    Anion gap 30 (*)    All other components within normal limits  CBC WITH DIFFERENTIAL/PLATELET - Abnormal; Notable for the following components:   WBC 20.8 (*)    RBC 6.02 (*)    Hemoglobin 17.1 (*)    HCT 55.2 (*)  RDW 15.9 (*)    Platelets 462 (*)    Neutro Abs 13.0 (*)    Lymphs Abs 5.6 (*)     Monocytes Absolute 1.8 (*)    Abs Immature Granulocytes 0.18 (*)    All other components within normal limits  BLOOD GAS, ARTERIAL - Abnormal; Notable for the following components:   pH, Arterial <6.95 (*)    pCO2 arterial 57 (*)    Bicarbonate 12.5 (*)    Acid-base deficit 21.4 (*)    Allens test (pass/fail) NOT INDICATED (*)    All other components within normal limits  LIPASE, BLOOD - Abnormal; Notable for the following components:   Lipase 112 (*)    All other components within normal limits  LACTIC ACID, PLASMA - Abnormal; Notable for the following components:   Lactic Acid, Venous >9.0 (*)    All other components within normal limits  CBG MONITORING, ED - Abnormal; Notable for the following components:   Glucose-Capillary 259 (*)    All other components within normal limits  RESP PANEL BY RT-PCR (FLU A&B, COVID) ARPGX2  PROTIME-INR  URINALYSIS, ROUTINE W REFLEX MICROSCOPIC  RAPID URINE DRUG SCREEN, HOSP PERFORMED  I-STAT CHEM 8, ED    Notable for severe lactic acidosis, low pH, hypokalemia, acute kidney injury  EKG   EKG Interpretation  Date/Time:  Friday December 07 2021 18:03:59 EDT Ventricular Rate:  134 PR Interval:  171 QRS Duration: 93 QT Interval:  275 QTC Calculation: 411 R Axis:   44 Text Interpretation: Sinus tachycardia Consider right atrial enlargement Repol abnrm, prob ischemia, inferolateral lds Confirmed by Garnette Gunner [phone]) on 12/12/2021 6:06:21 PM         Imaging Studies ordered: I ordered imaging studies including CT head, CT chest and abdomen On my interpretation imaging demonstrates distended stomach, no acute intracranial process I independently visualized and interpreted imaging. I agree with the radiologist interpretation   Medicines ordered and prescription drug management: Meds ordered this encounter  Medications   0.9 %  sodium chloride infusion   norepinephrine (LEVOPHED) 730m in 2568m(0.016 mg/mL) premix infusion     Order Specific Question:   IV Access    Answer:   Peripheral   rocuronium bromide 100 MG/10ML SODavy Pique: cabinet override   DISCONTD: succinylcholine (ANECTINE) 200 MG/10ML syringe    SmTerance Hart: cabinet override   etomidate (AMIDATE) 2 MG/ML injection    SmTerance Hart: cabinet override   rocuronium bromide 100 MG/10ML SODavy Pique: cabinet override   etomidate (AMIDATE) 2 MG/ML injection    SmTerance Hart: cabinet override   EPINEPHrine (ADRENALIN) 1 MG/10ML injection   vasopressin 20 units/100 mL infusion SOLN    Vogler, Tiffany B: cabinet override   vasopressin (PITRESSIN) 20 Units in sodium chloride 0.9 % 100 mL infusion-*FOR SHOCK*   rocuronium (ZEMURON) injection   etomidate (AMIDATE) injection   norepinephrine (LEVOPHED) 30m73mn 250m430m.016 mg/mL) premix infusion   0.9 %  sodium chloride infusion   EPINEPHrine (ADRENALIN) 5 mg in NS 250 mL (0.02 mg/mL) premix infusion   EPINEPHrine NaCl 5-0.9 MG/250ML-% premix infusion    MillRuel Favorscabinet override   vancomycin (VANCOCIN) IVPB 1000 mg/200 mL premix   piperacillin-tazobactam (ZOSYN) IVPB 3.375 g    Order Specific Question:   Antibiotic Indication:    Answer:   Sepsis   sodium chloride 0.9 % bolus 1,000 mL   sodium chloride 0.9 %  bolus 1,000 mL   sodium chloride 0.9 % bolus 1,000 mL   potassium chloride 10 mEq in 100 mL IVPB   sodium bicarbonate 150 mEq in dextrose 5 % 1,150 mL infusion    Sodium Bicarbonate 150 mEq added to 1L D5W = 480 mOsm/L (effective osmolarity: 261 mOsm/L).   sodium bicarbonate injection 50 mEq   EPINEPhrine 10 mcg/mL Adult IV Push Syringe (For Blood Pressure Support)    -I have reviewed the patients home medicines and have made adjustments as needed   Consultations Obtained: I requested consultation with the intensivist,  and discussed lab and imaging findings as well as pertinent plan - they recommend: er to ER transfer, continue  aggressive fluid resucitation   Cardiac Monitoring: The patient was maintained on a cardiac monitor.  I personally viewed and interpreted the cardiac monitored which showed an underlying rhythm of: sinus tachycardia   Reevaluation: After the interventions noted above, I reevaluated the patient and found that they have improved  Co morbidities that complicate the patient evaluation  Past Medical History:  Diagnosis Date   Anemia    High cholesterol    Hypertension    Small bowel obstruction ([place])    hospital admission 08/24/2012   Type II diabetes mellitus ([place])       Dispostion: Disposition decision including need for hospitalization was considered, and patient transferred ER - to - ER for ICU admission    Final Clinical Impression(s) / ED Diagnoses Final diagnoses:  Cardiac arrest ([place])  Lactic acidosis  Small bowel obstruction ([facility])  Aspiration pneumonia, unspecified aspiration pneumonia type, unspecified laterality, unspecified part of lung ([place])  Glasgow coma scale total score 3-8, at arrival to emergency department [facility])     This chart was dictated using voice recognition software.  Despite best efforts to proofread,  errors can occur which can change the documentation meaning.    Cristie Hem, MD 12/15/2021 2102

## 2021-12-07 NOTE — Code Documentation (Signed)
Pulse check. No pulse.

## 2021-12-07 NOTE — Progress Notes (Signed)
A-line was leaking blood.  This RT  used a chloraprep to clean area; applied a biopatch and redressed the site.  Good blood return and BP waveform.

## 2021-12-07 NOTE — ED Notes (Signed)
All drips moved to central line at this time. Carelink been at bedside.

## 2021-12-07 NOTE — ED Notes (Signed)
Pt brought in by ems for c/o sob and severe abdominal pain that started today; pt was alert and oriented when ems arrived but became unresponsive en route;  When pt arrived pt was found to not have pulses and CPR was started

## 2021-12-07 NOTE — ED Notes (Signed)
Transferred to cat scan and back , Care link here to remove to [facility].

## 2021-12-07 NOTE — Code Documentation (Signed)
Pulse check- Pulse

## 2021-12-07 NOTE — Code Documentation (Signed)
Pt intubated

## 2021-12-07 NOTE — Code Documentation (Signed)
Compressions in progress

## 2021-12-07 NOTE — Code Documentation (Signed)
Resume compressions

## 2021-12-07 NOTE — Sepsis Progress Note (Signed)
Elink monitoring for the code sepsis protocol.  

## 2021-12-07 NOTE — H&P (Signed)
NAME:  Gabriela Watkins, MRN:  [id number], DOB:  [birth year], LOS: 0 ADMISSION DATE:  11/23/2021, CONSULTATION DATE:  12/16/2021 REFERRING MD:  Maryan Rued, CHIEF COMPLAINT:  SOB and severe abdominal pain  History of Present Illness:  Patient is encephalopathic and/or intubated. Therefore history has been obtained from chart review.   Gabriela Watkins, is a 56 y.o. female, who presented to [facility] with a chief complaint of SOB and severe abdominal pain  They have a pertinent past medical history of DM2, HTN, SBO, abdominal wall hernia  Prior to arrival per notes patient with SOB and severe abdominal pain that started on 10/20. EMS was called. In route patient became unresponsive. CPR started in the [place].   ED course was notable possibly around 4-5 minutes of CPR with ROSC. Post ROSC patient was intubated- unclear neurological status. Patient with profound hypotension post CPR, sever acidosis and severe lactic acidosis. Pt was transferred from [place] to [facility]. Levo 40, EPI 20, neo 300, vaso 0.04 with SBP in 60's.  PCCM was consulted for admission  Pertinent  Medical History  DM2, HTN, SBO, abdominal wall hernia  Significant Hospital Events: Including procedures, antibiotic start and stop dates in addition to other pertinent events   10/20 Presented to [facility], Cardiac arrest 4-6 min CPR, unclear down time, ETT>, CVC>, Aline>, Txr to [facility], PCCM consult  Interim History / Subjective:  See above  Levo 40, EPI 20, neo 300, vaso 0.04, HCO2 Gtt 266m/hr  Unable to obtain subjective evaluation due to patient status Objective   Blood pressure (!) 66/31, pulse 95, temperature (!) 95.9 F (35.5 C), resp. rate (!) 28, height _0  (1.626 m), SpO2 96 %.    Vent Mode: PRVC FiO2 (%):  [100 %] 100 % Set Rate:  [18 bmp-35 bmp] 35 bmp Vt Set:  [440 mL-450 mL] 450 mL PEEP:  [5 cmH20] 5 cmH20 Plateau Pressure:  [33 cmH20-34 cmH20] 34 cmH20   Intake/Output Summary (Last 24 hours) at 11/18/2021 2246 Last data filed  at 12/06/2021 2043 Gross per 24 hour  Intake --  Output 700 ml  Net -700 ml   There were no vitals filed for this visit.  Examination: General: In bed, critically ill appearing, obese HEENT: MM pale/moist, anicteric, atraumatic Neuro: RASS -5, PERRL 366m GCS 3T CV: S1S2, NSR, no m/r/g appreciated PULM:  air movement in all lobes, trachea midline, chest expansion symmetric GI: firm, bsx4 hypoactive, NGT in place Extremities: cool/dry, no pretibial edema, capillary refill less than 3 seconds  Skin:  no rashes or lesions noted, oozing from arterial line and CVC sites.  Labs imaging: ABG 6.9/60.5/57/14.5 Ionized calcium 0.94 Creat 2.9, BUN 56 Glu 446 COVID/flu neg Lactic greater than 9 UDS/UA pending INR 1.1 Lipase 112 WBC 20 HGB 17.1 PLT 462 AG 30 CT head: No acute intracranial abnormality CT chest: RUL, LUL< LLL pneumonia, suspect aspiration. Small bowel obstruction with transition point within a large midline lower ventral wall hernia  Resolved Hospital Problem list     Assessment & Plan:  Septic shock, ? Abdominal source vs secondary to pneumonia Cardiac arrest Lactic acidosis secondary to shock AGMA, secondary to above Patient presented with SOB and abdominal pain. Found pulseless in [facility], unclear downtime, 4-6 min CPR with ROSC. Unclear cause.  CT chest: RUL, LUL< LLL pneumonia, suspect aspiration. Small bowel obstruction with transition point within a large midline lower ventral wall hernia. Lactate greater than 9. PH 6.9. HGB 17.1, WBC 20. Given 3L IVF at  [facility]. Started on Zosyn and Star to ICU with continuous tele and SPO2 monitoring -Goal MAP greater than 65. On max dose levo, vaso, epi, and vaso. BP in 60's suspect this is non survivable but family would like everything done.  -Continue HC03 GTT. -Goal normothermia.Target temperature between 36 and 37.8 degrees celsius. Notify provider if temperature goals are not met. Place esophageal or bladder  temperature probe. -Trend lactate. -Check blood cultures, urine culture, and procalcitonin -Continue Vanc and Zosyn -Obtain ECHO -Strict I&O  Acute Metabolic Encephalopathy Secondary to cardiac arrest. Unclear neuro status post ROSC. Head CT negative -Will hold on ordering EEG at this time due to HD instability.  -Frequent neuro exams -Continue neuroprotective measures: goal normothermia, euglycemia, HOB greater than 30 when able, head in neutral alignment, normocapnia, normoxia, normonatremia.    Acute respiratory failure with hypoxia and hypercapnia Secondary to cardiac arrest.  -LTVV strategy with tidal volumes of 4-8 cc/kg ideal body weight -Goal plateau pressures less than 30 and driving pressures less than 15 -Wean PEEP/FiO2 for SpO2 92-98% -VAP bundle -RASS goal 0 to -1 -Follow intermittent CXR and ABG PRN  AKI Suspect prerenal secondary to cardiac arrest and shock. Creat 2.9, BUN 56 -Ensure renal perfusion. Goal MAP 65 or greater. -Avoid neprotoxic drugs as possible. -Strict I&O's -Follow up AM creatinine  HX HTN -Hold home antihypertensives at this time  SBO, abdominal wall hernia Small bowel obstruction with transition point within a large midline lower ventral wall hernia seen on CT. -Continue NGT to LIS.  -Bowel rest -Will consider GS consult based on clinical course. Maxed on vasopressors at this time.  DM2 Hyperglycemia Initial glucose in ED 259. Now 446. HCO3 on CMP on admission 22. Do not suspect DKA at this time. 3L IV given. -Will start insulin GTT.  Best Practice (right click and "Reselect all SmartList Selections" daily)   Diet/type: NPO DVT prophylaxis: SCD GI prophylaxis: PPI Lines: Central line, Arterial Line, and yes and it is still needed Foley:  Yes, and it is still needed Code Status:  full code Last date of multidisciplinary goals of care discussion [Dr. Loanne Drilling discussed with the patients son at bedside. Son having a hard time. Wants [place]  to try CPR at least once. States "I'm not ready for her to go"]  Labs   CBC: Recent Labs  Lab 12/13/2021 1650 12/01/2021 2235 12/14/2021 2239  WBC 20.8*  --   --   NEUTROABS 13.0*  --   --   HGB 17.1* 15.6* 16.3*  HCT 55.2* 46.0 48.0*  MCV 91.7  --   --   PLT 462*  --   --     Basic Metabolic Panel: Recent Labs  Lab 11/24/2021 1650 11/22/2021 2235 11/21/2021 2239  NA 143 136 137  K 2.6* 4.6 4.5  CL 91*  --  101  CO2 22  --   --   GLUCOSE 317*  --  446*  BUN 43*  --  56*  CREATININE 2.73*  --  2.90*  CALCIUM 11.6*  --   --    GFR: Estimated Creatinine Clearance: 29.9 mL/min (A) (by C-G formula based on SCr of 2.9 mg/dL (H)). Recent Labs  Lab 12/01/2021 1650 11/28/2021 1804  WBC 20.8*  --   LATICACIDVEN  --  >9.0*    Liver Function Tests: Recent Labs  Lab 12/05/2021 1650  AST 31  ALT 34  ALKPHOS 168*  BILITOT 0.8  PROT 10.3*  ALBUMIN 5.5*   Recent Labs  Lab 12/11/2021 1650  LIPASE 112*   No results for input(s): "AMMONIA" in the last 168 hours.  ABG    Component Value Date/Time   PHART 6.987 (LL) 11/30/2021 2235   PCO2ART 60.5 (H) 12/13/2021 2235   PO2ART 57 (L) 11/19/2021 2235   HCO3 14.5 (L) 11/28/2021 2235   TCO2 18 (L) 12/06/2021 2239   ACIDBASEDEF 18.0 (H) 11/26/2021 2235   O2SAT 71 12/03/2021 2235     Coagulation Profile: Recent Labs  Lab 12/17/2021 1650  INR 1.1    Cardiac Enzymes: No results for input(s): "CKTOTAL", "CKMB", "CKMBINDEX", "TROPONINI" in the last 168 hours.  HbA1C: HB A1C (BAYER DCA - WAIVED)  Date/Time Value Ref Range Status  11/30/2021 02:42 PM 7.2 (H) 4.8 - 5.6 % Final    Comment:             Prediabetes: 5.7 - 6.4          Diabetes: >6.4          Glycemic control for adults with diabetes: <7.0   09/21/2021 03:12 PM 6.0 (H) 4.8 - 5.6 % Final    Comment:             Prediabetes: 5.7 - 6.4          Diabetes: >6.4          Glycemic control for adults with diabetes: <7.0     CBG: Recent Labs  Lab 11/27/2021 1656  GLUCAP  259*    Review of Systems:   Unable to obtain ROS due to patient status  Past Medical History:  She,  has a past medical history of Anemia, High cholesterol, Hypertension, Small bowel obstruction ([place]), and Type II diabetes mellitus ([place]).   Surgical History:   Past Surgical History:  Procedure Laterality Date   COLONOSCOPY WITH PROPOFOL N/A 07/25/2021   Procedure: COLONOSCOPY WITH PROPOFOL;  Surgeon: Juanita Craver, MD;  Location: [facility];  Service: Gastroenterology;  Laterality: N/A;   ESOPHAGOGASTRODUODENOSCOPY (EGD) WITH PROPOFOL N/A 07/25/2021   Procedure: ESOPHAGOGASTRODUODENOSCOPY (EGD) WITH PROPOFOL;  Surgeon: Juanita Craver, MD;  Location: [facility] ENDOSCOPY;  Service: Gastroenterology;  Laterality: N/A;   GIVENS CAPSULE STUDY N/A 07/25/2021   Procedure: GIVENS CAPSULE STUDY;  Surgeon: Juanita Craver, MD;  Location: [facility];  Service: Gastroenterology;  Laterality: N/A;   HERNIA REPAIR  ~ 01/2009; 04/2009   "stomach" (08/25/2012)   LEFT OOPHORECTOMY  ~ 2004   "tumor" (08/25/2012)   RIGHT OOPHORECTOMY  ~ 2007   "tumor; only took off a portion of the ovary" (08/25/2012)     Social History:   reports that she has never smoked. She has never used smokeless tobacco. She reports that she does not drink alcohol and does not use drugs.   Family History:  Her family history includes Diabetes in her mother.   Allergies No Known Allergies   Home Medications  Prior to Admission medications   Medication Sig Start Date End Date Taking? Authorizing Provider  atorvastatin (LIPITOR) 80 MG tablet Take 1 tablet (80 mg total) by mouth daily. 11/30/21   Sharion Balloon, FNP  blood glucose meter kit and supplies KIT Dispense based on patient and insurance preference. Use up to four times daily as directed. (FOR ICD-9 250.00, 250.01). 06/01/15   Evelina Dun A, FNP  ferrous sulfate 325 (65 FE) MG tablet Take 1 tablet (325 mg total) by mouth daily. 11/30/21   Evelina Dun A, FNP  glucose blood  (ACCU-CHEK GUIDE) test strip Test BS up  to 4 times daily Dx E11.9 11/21/20   Evelina Dun A, FNP  ibuprofen (ADVIL) 200 MG tablet Take 400-600 mg by mouth every 6 (six) hours as needed for mild pain.    [provider]  Lancets 30G MISC Check blood glucose BID and prn 03/02/15   Evelina Dun A, FNP  lisinopril (ZESTRIL) 20 MG tablet Take 1 tablet (20 mg total) by mouth daily. 11/30/21   Evelina Dun A, FNP  metFORMIN (GLUCOPHAGE-XR) 500 MG 24 hr tablet TAKE 1 TABLET BY MOUTH EVERY DAY WITH BREAKFAST 11/30/21   Hawks, Christy A, FNP  terbinafine (LAMISIL) 250 MG tablet Take 1 tablet (250 mg total) by mouth daily. 11/30/21   Sharion Balloon, FNP     Critical care time: 40 minutes    The patient is critically ill with multiple organ systems failure and requires high complexity decision making for assessment and support, frequent evaluation and titration of therapies, application of advanced monitoring technologies and extensive interpretation of multiple databases.    Critical Care Time devoted to patient care services described in this note is 40 minutes. This time reflects time of care of this [place] NP. This critical care time does not reflect procedure time but could involve care discussion time with the PCCM attending.  [place] School., MSN, APRN, AGACNP-BC [facility]  12/15/2021 , 11:29 PM  Please see [email or web address] for pager details  If no response, please call [phone] After hours, please call Elink at [phone]

## 2021-12-08 DIAGNOSIS — J9691 Respiratory failure, unspecified with hypoxia: Secondary | ICD-10-CM | POA: Diagnosis present

## 2021-12-08 DIAGNOSIS — A419 Sepsis, unspecified organism: Secondary | ICD-10-CM | POA: Diagnosis not present

## 2021-12-08 DIAGNOSIS — E872 Acidosis, unspecified: Secondary | ICD-10-CM | POA: Insufficient documentation

## 2021-12-08 LAB — POC OCCULT BLOOD, ED: Fecal Occult Bld: POSITIVE — AB

## 2021-12-08 LAB — CBG MONITORING, ED: Glucose-Capillary: 380 mg/dL — ABNORMAL HIGH (ref 70–99)

## 2021-12-08 MED ORDER — VANCOMYCIN HCL IN DEXTROSE 1-5 GM/200ML-% IV SOLN: 1000.0000 mg | INTRAVENOUS | Status: DC

## 2021-12-08 MED ORDER — PIPERACILLIN-TAZOBACTAM 3.375 G IVPB
3.3750 g | Freq: Three times a day (TID) | INTRAVENOUS | Status: DC
  Filled 2021-12-08: qty 50

## 2021-12-19 NOTE — ED Notes (Signed)
Time of Death announce 0113 by this RN and PA Izell Loxley.

## 2021-12-19 NOTE — Progress Notes (Signed)
Pharmacy Antibiotic Note  NADJA LINA is a 56 y.o. female admitted on 12-23-2021 with  sepsis/pneumonia .  Pharmacy has been consulted for Vancomycin/Zosyn dosing. WBC is elevated. Noted renal dysfunction. S/P cardiac arrest.   Plan: Vancomycin 1000 mg IV q36h >>>Estimated AUC: 505 Zosyn 3.375G IV q8h to be infused over 4 hours Trend WBC, temp, renal function  F/U infectious work-up Drug levels as indicated   Height: 5\' 4"  (162.6 cm) IBW/kg (Calculated) : 54.7  Temp (24hrs), Avg:95.4 F (35.2 C), Min:93.9 F (34.4 C), Max:96.3 F (35.7 C)  Recent Labs  Lab 12-23-2021 1650 12-23-2021 1804 December 23, 2021 2239  WBC 20.8*  --   --   CREATININE 2.73*  --  2.90*  LATICACIDVEN  --  >9.0*  --     Estimated Creatinine Clearance: 29.9 mL/min (A) (by C-G formula based on SCr of 2.9 mg/dL (H)).    No Known Allergies  Narda Bonds, PharmD, BCPS Clinical Pharmacist Phone: [phone]

## 2021-12-19 NOTE — Progress Notes (Signed)
RT call to bedside due to Pt expired. Pt taken off vent, ETT left in.

## 2021-12-19 NOTE — Death Summary Note (Addendum)
DEATH SUMMARY   Patient Details  Name: Gabriela Watkins MRN: [id number] DOB: 1965/10/26  Admission/Discharge Information   Admit Date:  2021-12-10  Date of Death:  12-11-2021  Time of Death:  1:13 AM  Length of Stay: 1  Referring Physician: Sharion Balloon, FNP   Reason(s) for Hospitalization  Abdominal pain  Diagnoses  Preliminary cause of death: MOSF secondary to small bowel obstruction Secondary Diagnoses (including complications and co-morbidities): Cardiac arrest Principal Problem:   Cardiac arrest [facility]) Active Problems:   Small bowel obstruction (HCC)   Aspiration pneumonia ([place])   Lactic acidosis   AKI (acute kidney injury) ([place])   Acute metabolic encephalopathy   Respiratory failure with hypoxia and hypercapnia ([place])   Septic shock ([place])   Metabolic acidosis   Brief Hospital Course (including significant findings, care, treatment, and services provided and events leading to death)  Gabriela Watkins is a 56 year old female with morbid obesity, HTN, DM2, HLD, IDA who presented with shortness of breath and abdominal pain/nausea/vomiting x 1 day however while in en route with EMS she became unresponsive. On arrival to [facility] she was pulseless and CPR was started. CPR performed for ~4 min with epi x 2 given followed by [facility]. Intubated. She was transferred to [facility] for further management. She arrived on epi 20, levo 40, vasopressin, neo 300 with SBP in the 60s. SpO2 in the 80s on FIO2 100% PEEP 5. Sodium bicarb gtt running. ABG 6.9/60/57. Additional amp of bicarb given. Labs significant for LA >9, WBC 20, BUN 56/2.9. CT head NAICA. CT CAP - Bibasilar consolidation R>L, small bowel obstruction. Goals of care discussed with family. Critically ill female with small bowel obstruction now s/p cardiac arrest on multiple pressors with refractory shock. She is persistently acidotic despite max medical therapy. After discussion, son agreed for DNR however wanted to continue current medical care.  She expired at 1:13 AM on December 11, 2021.   Pertinent Labs and Studies  Significant Diagnostic Studies CT CHEST ABDOMEN PELVIS WO CONTRAST  Result Date: Dec 10, 2021 CLINICAL DATA:  Pneumonia, complication suspected, xray done Shortness of breath and severe abdominal pain. Became unresponsive in CPR was initiated. EXAM: CT CHEST, ABDOMEN AND PELVIS WITHOUT CONTRAST TECHNIQUE: Multidetector CT imaging of the chest, abdomen and pelvis was performed following the standard protocol without IV contrast. RADIATION DOSE REDUCTION: This exam was performed according to the departmental dose-optimization program which includes automated exposure control, adjustment of the mA and/or kV according to patient size and/or use of iterative reconstruction technique. COMPARISON:  Chest radiograph earlier today. Abdominopelvic CT 11/28/2020 FINDINGS: CT CHEST FINDINGS Cardiovascular: Mild cardiomegaly. No significant pericardial effusion. Thoracic aorta is tortuous. No aortic aneurysm. Mediastinum/Nodes: Endotracheal tube tip just above the carina. Enteric tube decompresses the esophagus, tip in the stomach, side-port in the distal esophagus. Motion and lack contrast limits detailed assessment, allowing for this there is no bulky adenopathy or mediastinal mass. Lungs/Pleura: Dense airspace disease in the dependent right upper and right lower lobes. There is also a small right pleural effusion. Moderate airspace disease in the dependent left lower lobe. No convincing features of pulmonary edema. No definite endoluminal debris or retained mucus. Musculoskeletal: Evidence of acute osseous abnormality or anterior rib fracture, although breathing motion artifact partially obscures assessment. No chest wall contusion. CT ABDOMEN PELVIS FINDINGS Hepatobiliary: Enlarged liver spanning 25.8 cm cranial caudal. Suspected steatosis. No obvious focal liver abnormality on this unenhanced exam. The gallbladder is not seen. Common bile duct is poorly  defined, but  appears nondilated. Pancreas: Grossly negative. Spleen: Normal in size. Small peripheral subcapsular calcification. Adrenals/Urinary Tract: No adrenal nodule. No hydronephrosis, renal calculi or evidence of focal renal abnormality. The urinary bladder is decompressed by Foley catheter. Stomach/Bowel: The enteric tube tip is in the stomach, but side port is in the distal esophagus.There is a large midline ventral abdominal wall hernia. The stomach is markedly distended with intraluminal fluid, with the distal aspect of the stomach extending into the ventral hernia. Dilated fluid-filled small bowel proximally. Much of the small bowel is located within the large ventral hernia. There is transition from dilated to nondilated small bowel in the right aspect of the hernia, for example series 8, images 48 and 49, and coronal series 10, image 83. The more distal small bowel is decompressed. Multiple enteric sutures suggestive of prior enterotomy. Majority of the colon is located within the large ventral abdominal wall hernia, with the descending colon exiting and entering the hernia twice. Small volume of stool in the colon, no abnormal colonic distention or inflammation. There is no bowel pneumatosis. Vascular/Lymphatic: Mild aortic atherosclerosis, no aneurysm. No portal venous or mesenteric gas. There is no obvious bulky abdominopelvic adenopathy. Reproductive: Unremarkable uterus. There is no evidence of adnexal mass, low fluid-filled small bowel in the pelvis limits detailed assessment. Other: Large midline lower ventral abdominal wall hernia as described. No ascites. No free air. No abdominopelvic collection. Musculoskeletal: Mild multilevel lumbar degenerative change. There are no acute or suspicious osseous abnormalities. IMPRESSION: 1. Dense airspace disease in the dependent right upper and right lower lobes as well as left lower lobe, consistent with pneumonia, including aspiration. Small right  pleural effusion. 2. Small bowel obstruction with transition point within a large midline lower ventral abdominal wall hernia. The stomach is massively distended with fluid. This hernia contains segments of the distal stomach, small bowel and colon. 3. Hepatomegaly and hepatic steatosis. 4. Endotracheal tube tip just above the carina. Enteric tube tip is in the stomach, but side-port is in the distal esophagus. Recommend advancement. Aortic Atherosclerosis (ICD10-I70.0). Electronically Signed   By: Keith Rake M.D.   On: 09-Dec-2021 20:23   CT Head Wo Contrast  Result Date: 12/09/21 CLINICAL DATA:  Mental status change; unresponsive EXAM: CT HEAD WITHOUT CONTRAST TECHNIQUE: Contiguous axial images were obtained from the base of the skull through the vertex without intravenous contrast. RADIATION DOSE REDUCTION: This exam was performed according to the departmental dose-optimization program which includes automated exposure control, adjustment of the mA and/or kV according to patient size and/or use of iterative reconstruction technique. COMPARISON:  CT head 07/23/2021 FINDINGS: Brain: No intracranial hemorrhage, mass effect, or evidence of acute infarct. No hydrocephalus. No extra-axial fluid collection. Arachnoid herniation into the sella, a frequent normal variant. Vascular: No hyperdense vessel or unexpected calcification. Skull: No fracture or focal lesion. Sinuses/Orbits: No acute finding. Other: Partially visualized endotracheal and enteric tubes. IMPRESSION: No acute intracranial abnormality. Electronically Signed   By: Placido Sou M.D.   On: 12-09-2021 20:20   DG Chest Portable 1 View  Result Date: 2021/12/09 CLINICAL DATA:  Acute respiratory failure. Endotracheal tube and orogastric tube placement. EXAM: PORTABLE CHEST 1 VIEW COMPARISON:  None Available. FINDINGS: Endotracheal tube is seen with tip approximately 2 cm above the carina. Orogastric tube is seen with tip in the proximal  stomach. Low lung volumes are seen with linear opacity in both lung bases consistent with subsegmental atelectasis. No evidence of pulmonary consolidation or pleural effusion. Heart size is within normal limits  allowing for low lung volumes. IMPRESSION: Endotracheal tube and orogastric tube in appropriate position. Low lung volumes and bibasilar subsegmental atelectasis. Electronically Signed   By: Marlaine Hind M.D.   On: 11/20/2021 17:35    Microbiology Recent Results (from the past 240 hour(s))  Resp Panel by RT-PCR (Flu A&B, Covid) Anterior Nasal Swab     Status: None   Collection Time: 11/19/2021  6:06 PM   Specimen: Anterior Nasal Swab  Result Value Ref Range Status   SARS Coronavirus 2 by RT PCR NEGATIVE NEGATIVE Final    Comment: (NOTE) SARS-CoV-2 target nucleic acids are NOT DETECTED.  The SARS-CoV-2 RNA is generally detectable in upper respiratory specimens during the acute phase of infection. The lowest concentration of SARS-CoV-2 viral copies this assay can detect is 138 copies/mL. A negative result does not preclude SARS-Cov-2 infection and should not be used as the sole basis for treatment or other patient management decisions. A negative result may occur with  improper specimen collection/handling, submission of specimen other than nasopharyngeal swab, presence of viral mutation(s) within the areas targeted by this assay, and inadequate number of viral copies(<138 copies/mL). A negative result must be combined with clinical observations, patient history, and epidemiological information. The expected result is Negative.  Fact Sheet for Patients:  EntrepreneurPulse.com.au  Fact Sheet for Healthcare Providers:  IncredibleEmployment.be  This test is no t yet approved or cleared by the [place] FDA and  has been authorized for detection and/or diagnosis of SARS-CoV-2 by FDA under an Emergency Use Authorization (EUA). This EUA will remain   in effect (meaning this test can be used) for the duration of the COVID-19 declaration under Section 564(b)(1) of the Act, 21 U.S.C.section 360bbb-3(b)(1), unless the authorization is terminated  or revoked sooner.       Influenza A by PCR NEGATIVE NEGATIVE Final   Influenza B by PCR NEGATIVE NEGATIVE Final    Comment: (NOTE) The Xpert Xpress SARS-CoV-2/FLU/RSV plus assay is intended as an aid in the diagnosis of influenza from Nasopharyngeal swab specimens and should not be used as a sole basis for treatment. Nasal washings and aspirates are unacceptable for Xpert Xpress SARS-CoV-2/FLU/RSV testing.  Fact Sheet for Patients: EntrepreneurPulse.com.au  Fact Sheet for Healthcare Providers: IncredibleEmployment.be  This test is not yet approved or cleared by the [place] FDA and has been authorized for detection and/or diagnosis of SARS-CoV-2 by FDA under an Emergency Use Authorization (EUA). This EUA will remain in effect (meaning this test can be used) for the duration of the COVID-19 declaration under Section 564(b)(1) of the Act, 21 U.S.C. section 360bbb-3(b)(1), unless the authorization is terminated or revoked.  Performed at [facility], [address] [place], [place] [address]     Lab Basic Metabolic Panel: Recent Labs  Lab 12/05/2021 1650 11/19/2021 2235 12/16/2021 2239  NA 143 136 137  K 2.6* 4.6 4.5  CL 91*  --  101  CO2 22  --   --   GLUCOSE 317*  --  446*  BUN 43*  --  56*  CREATININE 2.73*  --  2.90*  CALCIUM 11.6*  --   --    Liver Function Tests: Recent Labs  Lab 11/20/2021 1650  AST 31  ALT 34  ALKPHOS 168*  BILITOT 0.8  PROT 10.3*  ALBUMIN 5.5*   Recent Labs  Lab 12/16/2021 1650  LIPASE 112*   No results for input(s): "AMMONIA" in the last 168 hours. CBC: Recent Labs  Lab 11/26/2021 1650 11/24/2021 2235  11/21/2021 2239  WBC 20.8*  --   --   NEUTROABS 13.0*  --   --   HGB 17.1* 15.6* 16.3*  HCT 55.2*  46.0 48.0*  MCV 91.7  --   --   PLT 462*  --   --    Cardiac Enzymes: No results for input(s): "CKTOTAL", "CKMB", "CKMBINDEX", "TROPONINI" in the last 168 hours. Sepsis Labs: Recent Labs  Lab 12/09/2021 1650 12/07/21 1804  WBC 20.8*  --   LATICACIDVEN  --  >9.0*    Procedures/Operations  ETT, arterial line, femoral CVC   Goku Harb Rodman Pickle Dec 14, 2021, 1:56 AM

## 2021-12-19 NOTE — ED Notes (Signed)
Chaplain paged to bedside.

## 2021-12-19 NOTE — IPAL (Signed)
  Interdisciplinary Goals of Care Family Meeting   Date carried out: 11/30/2021  Location of the meeting: Bedside  Member's involved: Physician and Family Member or next of kin  Durable Power of Attorney or acting medical decision maker: Son, Diksha Tagliaferro    Discussion: We discussed goals of care for [employer] .  Critically ill with multiorgan failure post-arrest in setting of small bowel obstruction. In refractory shock. After discussion with multiple family members including patient's sisters, decision for DNR.  Code status: Full DNR  Disposition: Continue current acute care  Time spent for the meeting: 20 min    Rana Adorno Rodman Pickle, MD  11/20/2021, 1:54 AM

## 2021-12-19 NOTE — ED Provider Notes (Signed)
  1:14 AM Called to bedside as no HR on telemetry.  No palpable pulses, no heart tones auscultated.  TOD pronounced at 0113.  Family remains at bedside.  Critical care team updated.   Larene Pickett, PA-C Dec 30, 2021 Kara Mead, April, MD 2021/12/30 [phone]

## 2021-12-19 NOTE — ED Notes (Signed)
Patient taken to morgue at this time.

## 2021-12-19 NOTE — Progress Notes (Signed)
Chaplain engaged in an initial visit with Gabriela Watkins's family and friends.  Chaplain offered a prayer over Gabriela Watkins who was Gabriela Watkins.  Chaplain ensured it was ok with family to proceed in a prayer.  They shared that Gabriela Watkins was full of life, joy, dance, and laughter.    Chaplain affirmed their love for [facility] and their presence with her. Chaplain is available to follow-up as needed.    2021-12-27 0100  Clinical Encounter Type  Visited With Patient and family together  Visit Type Initial;Spiritual support;Death  Referral From Family;Nurse  Consult/Referral To Chaplain  Spiritual Encounters  Spiritual Needs Prayer

## 2021-12-19 DEATH — deceased

## 2022-03-22 ENCOUNTER — Ambulatory Visit: Payer: Managed Care, Other (non HMO) | Admitting: Family
